# Patient Record
Sex: Male | Born: 1945
Health system: Southern US, Community
[De-identification: ages and names within clinical notes are randomized; demographics above are authoritative.]

## PROBLEM LIST (undated history)

## (undated) DIAGNOSIS — M419 Scoliosis, unspecified: Secondary | ICD-10-CM

## (undated) DIAGNOSIS — L03818 Cellulitis of other sites: Secondary | ICD-10-CM

## (undated) DIAGNOSIS — I10 Essential (primary) hypertension: Secondary | ICD-10-CM

## (undated) DIAGNOSIS — M1990 Unspecified osteoarthritis, unspecified site: Secondary | ICD-10-CM

## (undated) DIAGNOSIS — M109 Gout, unspecified: Secondary | ICD-10-CM

## (undated) DIAGNOSIS — R161 Splenomegaly, not elsewhere classified: Secondary | ICD-10-CM

## (undated) DIAGNOSIS — G709 Myoneural disorder, unspecified: Secondary | ICD-10-CM

## (undated) DIAGNOSIS — K635 Polyp of colon: Secondary | ICD-10-CM

## (undated) DIAGNOSIS — L02818 Cutaneous abscess of other sites: Secondary | ICD-10-CM

## (undated) HISTORY — DX: Cellulitis of other sites: L03.818

## (undated) HISTORY — PX: OTHER SURGICAL HISTORY: SHX169

## (undated) HISTORY — DX: Essential (primary) hypertension: I10

## (undated) HISTORY — PX: BACK SURGERY: SHX140

## (undated) HISTORY — DX: Cellulitis of other sites: L02.818

---

## 2003-02-19 ENCOUNTER — Inpatient Hospital Stay (HOSPITAL_COMMUNITY): Admission: RE | Admit: 2003-02-19 | Discharge: 2003-02-20 | Payer: Self-pay | Admitting: Neurosurgery

## 2003-02-19 ENCOUNTER — Encounter: Payer: Self-pay | Admitting: Neurosurgery

## 2007-11-06 ENCOUNTER — Encounter: Payer: Self-pay | Admitting: Infectious Diseases

## 2007-11-13 DIAGNOSIS — I10 Essential (primary) hypertension: Secondary | ICD-10-CM

## 2007-11-19 ENCOUNTER — Ambulatory Visit: Payer: Self-pay | Admitting: Infectious Diseases

## 2007-11-19 DIAGNOSIS — L02818 Cutaneous abscess of other sites: Secondary | ICD-10-CM

## 2007-11-19 DIAGNOSIS — L03818 Cellulitis of other sites: Secondary | ICD-10-CM

## 2008-05-21 ENCOUNTER — Ambulatory Visit: Payer: Self-pay | Admitting: Cardiology

## 2008-06-10 ENCOUNTER — Ambulatory Visit: Payer: Self-pay | Admitting: Cardiology

## 2008-08-25 ENCOUNTER — Emergency Department (HOSPITAL_COMMUNITY): Admission: EM | Admit: 2008-08-25 | Discharge: 2008-08-25 | Payer: Self-pay | Admitting: Emergency Medicine

## 2008-09-07 ENCOUNTER — Telehealth: Payer: Self-pay | Admitting: Infectious Diseases

## 2009-02-18 ENCOUNTER — Encounter: Payer: Self-pay | Admitting: Cardiology

## 2009-02-25 ENCOUNTER — Ambulatory Visit: Payer: Self-pay | Admitting: Cardiology

## 2010-03-07 ENCOUNTER — Ambulatory Visit: Payer: Self-pay | Admitting: Cardiology

## 2011-01-17 NOTE — Assessment & Plan Note (Signed)
Summary: 1 YR FU PER MARCH REMINDER-SRS   Visit Type:  Follow-up Primary Provider:  Dr. Doreen Beam  CC:  follow-up visit.  History of Present Illness: the patient is a 65 year old male with a history of hypertension. Patient also has history of recurrent Staphylococcus infections. He has known hypertensive heart disease. Stress test has been negative for ischemia an echocardiogram demonstrates normal LV function. The patient states is doing well. He denies any chest pain shortness of breath. He has no orthopnea PND. He is followed by primary care physician for routine blood work and had a lipid panel drawn. Reportedly this was within normal limits.  The patient denies any palpitations presyncope or syncope.  Preventive Screening-Counseling & Management  Alcohol-Tobacco     Smoking Status: quit  Comments: quit 17 yrs ago, started as a child  Current Medications (verified): 1)  Bactrim Ds 800-160 Mg  Tabs (Sulfamethoxazole-Trimethoprim) .... Two Tablets Two Times A Day For 10 Days 2)  Lisinopril-Hydrochlorothiazide 10-12.5 Mg Tabs (Lisinopril-Hydrochlorothiazide) .... Take 1 Tablet By Mouth Once A Day 3)  Metoprolol Succinate 50 Mg Xr24h-Tab (Metoprolol Succinate) .... Take 1 Tablet By Mouth Once  A Day 4)  Simvastatin 40 Mg Tabs (Simvastatin) .... Take 1 Tablet By Mouth Every Night 5)  Aspirin 81 Mg Tbec (Aspirin) .... Take One Tablet By Mouth Daily  Allergies (verified): No Known Drug Allergies  Comments:  Nurse/Medical Assistant: The patient's medications were reviewed with the patient and were updated in the Medication List. Pt brought a list of medications to office visit.  Cyril Loosen, RN, BSN (March 07, 2010 9:45 AM)  Past History:  Past Medical History: Last updated: 07/08/2009 CELLULITIS AND ABSCESS OF OTHER SPECIFIED SITE [phone]) HYPERTENSION (ICD-401.9) DIABETES MELLITUS, TYPE II (ICD-250.00)    Family History: Last updated: 07/08/2009 Family History of  Cancer:  Family History of Coronary Artery Disease:  Family History of Diabetes:  kidney failure  Social History: Last updated: 07/08/2009 Fishes alot - both salt water and fresh Works in [employer].   Quit tobacco 14 yrs ago Alcohol Use - yes -- 6-8 at a time on the weekends  Risk Factors: Smoking Status: quit (03/07/2010)  Review of Systems  The patient denies anorexia, fever, weight loss, weight gain, vision loss, decreased hearing, hoarseness, chest pain, syncope, dyspnea on exertion, peripheral edema, prolonged cough, headaches, hemoptysis, abdominal pain, melena, hematochezia, severe indigestion/heartburn, hematuria, incontinence, genital sores, muscle weakness, suspicious skin lesions, transient blindness, difficulty walking, depression, unusual weight change, abnormal bleeding, enlarged lymph nodes, angioedema, breast masses, and testicular masses.    Vital Signs:  Patient profile:   65 year old male Height:      72 inches Weight:      192.50 pounds BMI:     26.20 Pulse rate:   62 / minute BP sitting:   94 / 63  (left arm) Cuff size:   regular  Vitals Entered By: Cyril Loosen, RN, BSN (March 07, 2010 9:40 AM)  Nutrition Counseling: Patient's BMI is greater than 25 and therefore counseled on weight management options. CC: follow-up visit Comments Pt c/o of some burning in his chest once in a while   Physical Exam  Additional Exam:  General: Well-developed, well-nourished in no distress head: Normocephalic and atraumatic eyes PERRLA/EOMI intact, conjunctiva and lids normal nose: No deformity or lesions mouth normal dentition, normal posterior pharynx neck: Supple, no JVD.  No masses, thyromegaly or abnormal cervical nodes lungs: Normal breath sounds bilaterally without wheezing.  Normal percussion heart: regular  rate and rhythm with normal S1 and S2, no S3 or S4.  PMI is normal.  No pathological murmurs abdomen: Normal bowel sounds, abdomen is soft and  nontender without masses, organomegaly or hernias noted.  No hepatosplenomegaly musculoskeletal: Back normal, normal gait muscle strength and tone normal pulsus: Pulse is normal in all 4 extremities Extremities: No peripheral pitting edema neurologic: Alert and oriented x 3 skin: Intact without lesions or rashes cervical nodes: No significant adenopathy psychologic: Normal affect    EKG  Procedure date:  03/07/2010  Findings:      normal sinus rhythm. Early repolarization probably normal variant. Heart rate 65 beats per minute  Impression & Recommendations:  Problem # 1:  HYPERTENSION (ICD-401.9) Blood pressure under very good control. The following medications were removed from the medication list:    Lopressor 50 Mg Tabs (Metoprolol tartrate) ..... One daily His updated medication list for this problem includes:    Lisinopril-hydrochlorothiazide 10-12.5 Mg Tabs (Lisinopril-hydrochlorothiazide) .Marland Kitchen... Take 1 tablet by mouth once a day    Metoprolol Succinate 50 Mg Xr24h-tab (Metoprolol succinate) .Marland Kitchen... Take 1 tablet by mouth once  a day    Aspirin 81 Mg Tbec (Aspirin) .Marland Kitchen... Take one tablet by mouth daily  Orders: EKG w/ Interpretation (93000)  Problem # 2:  CELLULITIS AND ABSCESS OF OTHER SPECIFIED SITE [phone]) Assessment: Comment Only  Patient Instructions: 1)  Your physician recommends that you continue on your current medications as directed. Please refer to the Current Medication list given to you today. 2)  Follow up in  1 year Prescriptions: SIMVASTATIN 40 MG TABS (SIMVASTATIN) Take 1 tablet by mouth every night  #30 x 11   Entered by:   Hoover Brunette, LPN   Authorized by:   Lewayne Bunting, MD, [facility]   Signed by:   Hoover Brunette, LPN on 06/16/1600   Method used:   Electronically to        [facility] [phone]* (retail)       [address]       [place], [place]  [address]       Ph: [phone] or [phone]       Fax: [phone]   RxID:    [id number] METOPROLOL SUCCINATE 50 MG XR24H-TAB (METOPROLOL SUCCINATE) Take 1 tablet by mouth once  a day  #30 x 11   Entered by:   Hoover Brunette, LPN   Authorized by:   Lewayne Bunting, MD, [facility]   Signed by:   Hoover Brunette, LPN on 93/81/8299   Method used:   [facility] to        [facility] [phone]* (retail)       [address]       [place], [place]  [address]       Ph: [phone] or [phone]       Fax: [phone]   RxID:   [id number] LISINOPRIL-HYDROCHLOROTHIAZIDE 10-12.5 MG TABS (LISINOPRIL-HYDROCHLOROTHIAZIDE) Take 1 tablet by mouth once a day  #30 Tablet x 11   Entered by:   Hoover Brunette, LPN   Authorized by:   Lewayne Bunting, MD, [facility]   Signed by:   Hoover Brunette, LPN on 12/45/8099   Method used:   [facility]ically to        [facility] [phone]* (retail)       [address]  [place], [place]  [address]       Ph: [phone] or [phone]       Fax: [phone]   RxID:   [id number]

## 2011-04-03 ENCOUNTER — Other Ambulatory Visit: Payer: Self-pay | Admitting: *Deleted

## 2011-04-03 MED ORDER — SIMVASTATIN 40 MG PO TABS: 40.0000 mg | ORAL_TABLET | Freq: Every day | ORAL | Status: DC

## 2011-04-06 ENCOUNTER — Encounter: Payer: Self-pay | Admitting: Cardiology

## 2011-04-07 ENCOUNTER — Ambulatory Visit (INDEPENDENT_AMBULATORY_CARE_PROVIDER_SITE_OTHER): Payer: BC Managed Care – PPO | Admitting: Cardiology

## 2011-04-07 ENCOUNTER — Encounter: Payer: Self-pay | Admitting: Cardiology

## 2011-04-07 VITALS — BP 119/81 | HR 53 | Ht 71.0 in | Wt 191.0 lb

## 2011-04-07 DIAGNOSIS — I10 Essential (primary) hypertension: Secondary | ICD-10-CM

## 2011-04-07 DIAGNOSIS — E785 Hyperlipidemia, unspecified: Secondary | ICD-10-CM

## 2011-04-07 MED ORDER — METOPROLOL SUCCINATE ER 50 MG PO TB24: 50.0000 mg | ORAL_TABLET | Freq: Every day | ORAL | Status: DC

## 2011-04-07 MED ORDER — SIMVASTATIN 40 MG PO TABS: 40.0000 mg | ORAL_TABLET | Freq: Every day | ORAL | Status: DC

## 2011-04-07 MED ORDER — LISINOPRIL-HYDROCHLOROTHIAZIDE 10-12.5 MG PO TABS: 1.0000 | ORAL_TABLET | Freq: Every day | ORAL | Status: DC

## 2011-04-07 NOTE — Patient Instructions (Signed)
Your physician you to follow up in 1 year. You will receive a reminder letter in the mail one-two months in advance. If you don't receive a letter, please call our office to schedule the follow-up appointment. Your physician recommends that you continue on your current medications as directed. Please refer to the Current Medication list given to you today. 

## 2011-04-29 DIAGNOSIS — E785 Hyperlipidemia, unspecified: Secondary | ICD-10-CM | POA: Insufficient documentation

## 2011-04-29 NOTE — Assessment & Plan Note (Signed)
On statin drug therapy and followed by his primary care physician. 

## 2011-04-29 NOTE — Assessment & Plan Note (Signed)
Blood pressure one control.  No further adjustments in medications are required.

## 2011-04-29 NOTE — Progress Notes (Signed)
HPI The patient is a 65 year old male with history of hypertension. He has a history of recurrent Staphylococcus infections. He has a history of hypertensive heart disease Previous stress tests have been negative for ischemia an echocardiogram has demonstrated normal LV function. Last office visit was a year ago and he was doing well. The patient is doing well. He reports no chest pain shortness of breath orthopnea PND. Has no palpitations or syncope. His blood pressures under control.  No Known Allergies  Current Outpatient Prescriptions on File Prior to Visit  Medication Sig Dispense Refill  . aspirin 81 MG tablet Take 81 mg by mouth daily.          Past Medical History  Diagnosis Date  . Hypertension   . Diabetes mellitus   . Cellulitis and abscess of other specified site     No past surgical history on file.  Family History  Problem Relation Age of Onset  . Cancer Other     Family Hx of Cancer, CAD,Diabetes,Kidney Failure    History   Social History  . Marital Status: Married    Spouse Name: N/A    Number of Children: N/A  . Years of Education: N/A   Occupational History  . 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 Smoking status: Former Smoker -- 3.0 packs/day    Types: Cigarettes    Quit date: 12/18/1996  . Smokeless tobacco: Not on file   Comment: started smoking as a child  . Alcohol Use: Yes  . Drug Use: No  . Sexually Active: Not on file   Other Topics Concern  . Not on file   Social History Narrative  . No narrative on file   Review of systems:Pertinent positives as outlined above. The remainder of the 18  point review of systems is negative  PHYSICAL EXAM BP 119/81  Pulse 53  Ht 5\' 11"  (1.803 m)  Wt 191 lb (86.637 kg)  BMI 26.64 kg/m2 General: Well-developed, well-nourished in no distress Head: Normocephalic and atraumatic Eyes:PERRLA/EOMI intact, conjunctiva and lids normal Ears: No deformity or lesions Mouth:normal dentition, normal  posterior pharynx Neck: Supple, no JVD.  No masses, thyromegaly or abnormal cervical nodes Lungs: Normal breath sounds bilaterally without wheezing.  Normal percussion Cardiac: regular rate and rhythm with normal S1 and S2, no S3 or S4.  PMI is normal.  No pathological murmurs Abdomen: Normal bowel sounds, abdomen is soft and nontender without masses, organomegaly or hernias noted.  No hepatosplenomegaly MSK: Back normal, normal gait muscle strength and tone normal Vascular: Pulse is normal in all 4 extremities Extremities: No peripheral pitting edema Neurologic: Alert and oriented x 3 Skin: Intact without lesions or rashes Lymphatics: No significant adenopathy Psychologic: Normal affect   ECG:not available  ASSESSMENT AND PLAN

## 2011-05-02 NOTE — Assessment & Plan Note (Signed)
[facility]                          [facility] OFFICE NOTE   NAME:[name], [name]                       MRN:          [id number]  DATE:05/21/2008                            DOB:          04-11-1946    REFERRING PHYSICIAN:  Doreen Beam, MD   REASON FOR CONSULTATION:  Evaluation of shortness of breath.   HISTORY OF PRESENT ILLNESS:  The patient is a very pleasant 65 year old  white male with no prior cardiac history, but with a history of  hypertension.  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  He has had a prior Cardiolite stress study done in 2004 ordered  by Dr. Sherril Croon, which was  essentially within normal limits, although his  ejection fraction was low normal at 48%.  He now reports approximately 1-  year history of dyspnea on exertion.  He also thinks that his blood  pressure has been elevated for the last 3 years.  He denies any chest  pain, although during activity and sometimes at rest, he has a sharp  knife-like sensation in the left hemichest that only lasts a few  minutes.  There is no associated nausea or diaphoresis.  He also reports  that after meals when he walks up a hill, he has some increasing  tightness, but particularly dyspnea.  He has no orthopnea and PND.  He  reports no palpitations or syncope.   The patient's cardiac risk factors include a history of tobacco use and  also exposure to secondhand smoke from his wife.  He has a family  history with a brother who has coronary artery bypass grafting.  He is  currently also under a lot of emotional and work-related stress.   ALLERGIES:  No known drug allergies.   MEDICATION:  Metoprolol tartrate 50 mg p.o. daily.   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  He did smoke up until 16 years ago, at which time he smoked 3  packs a day.  He is also exposed to secondhand smoke at his own  residence.  The patient states that  occasionally he will binge drinks  socially on the weekends.  At times, he will consume 6-8 beers at a  time.   FAMILY HISTORY:  Notable for mother and father who died from kidney  failure and colon cancer respectively.  He has 2 brothers and 1 sister;  one brother has had coronary bypass grafting and has diabetes mellitus.   REVIEW OF SYSTEMS:  As per HPI and otherwise negative.   PHYSICAL EXAMINATION:  VITAL SIGNS:  Blood pressure 179/101 in the right  arm, 176/91 in the left arm, heart rate is 53 beats per minute, and  weight is 195 pounds.  GENERAL:  Well-nourished white male, tanned, and in no distress.  HEENT:  Pupils are equal.  Conjunctivae are clear.  NECK:  Supple.  Normal carotid upstroke.  No carotid bruits.  LUNGS:  Clear breath sounds bilaterally.  HEART:  Regular rate and rhythm with normal S1 and S2.  No murmur, rubs,  or gallops.  ABDOMEN:  Soft.  Nontender.  No rebound or guarding.  Good bowel sounds.  EXTREMITIES:  No cyanosis, clubbing, or edema.  NEURO:  The patient is alert, oriented, and grossly nonfocal.  The  patient has good dorsalis pedis and posterior tibial pulses and has no  vascular bruits.   A 12-lead electrocardiogram, normal sinus rhythm with left ventricular  hypertrophy and early repolarization, which is likely a normal variant.   PROBLEM LIST:  1. Dyspnea.  2. Hypertensive heart disease.  3. Rule out coronary artery disease.  4. Multiple risk factors for coronary artery disease including      hypertension, family history, and prior tobacco use.  5. History of methicillin-resistant Staphylococcus aureus infection.  6. History of back surgery.   PLAN:  1. I suspect the patient has significant hypertensive heart disease.      He has markedly elevated blood pressures including his diastolic.      He has symptoms of chronic diastolic heart failure.  I doubt he has      significant coronary artery disease, but he will need to be      evaluated  for this.  2. I had a long discussion with the patient regarding risk factor      modification and his symptoms.  We will start him on aspirin and a      combination of hydrochlorothiazide and lisinopril 12.5 and 10 mg      respectively.  He can continue beta-blocker therapy.  The latter      will need to be held prior to his test.  3. The patient also will have a lipid panel drawn.  I assume that he      will need to start a statin drug therapy.  I also discussed with      him his occasional binge drinking and to discontinue this as this      worsens his hypertension.  We will follow up with the patient, and      his daughter-in-law will keep a diary and a log of his blood      pressure readings, and we will make further adjustments in his      antihypertensive medications.     Learta Codding, MD,FACC  Electronically Signed    GED/MedQ  DD: 05/21/2008  DT: 05/21/2008  Job #: [id number]   cc:   Doreen Beam, MD

## 2011-05-02 NOTE — Assessment & Plan Note (Signed)
[facility]                          [facility]ICE NOTE   NAME:[name]                       MRN:          [id number]  DATE:02/25/2009                            DOB:          12-Jan-1946    HISTORY OF PRESENT ILLNESS:  The patient is a 65 year old male with no  prior cardiac history, but with significant hypertension.  He was  evaluated for this on May 21, 2008.  His systolic blood pressure was  179.  His diastolic blood pressure was 101.  He reports a 1-year history  of dyspnea on exertion.  I felt that, at that time the patient had  hypertensive heart disease.  A prior stress test showed no ischemia with  ejection fraction of 48%.  A followup echo; however, after the patient  was started on medical treatment for hypertension showed now normal  ejection fraction with no wall motion abnormality.  Since his medical  therapy has been initiated, the patient has been doing extremely well.  He denies any chest pain, shortness of breath, orthopnea, or PND.  He  states that he has to hold on his feet, works actively.  He has  absolutely no limitations.  He is an NYHA class I.  The patient does  have a family history of coronary artery disease.  He seems to be under  less emotional and work-related stress than previously.   MEDICATIONS:  1. Metoprolol tartrate 50 mg p.o. daily.  2. Aspirin 81 p.o. daily.  3. Lisinopril and hydrochlorothiazide 12.5/10.  4. Simvastatin 40 mg p.o. at bedtime.   PHYSICAL EXAMINATION:  VITAL SIGNS:  Blood pressure is 105/63, heart  rate 58, and weight 194.2.  GENERAL:  Well nourished white male, in no apparent distress.  HEENT:  Pupils eyes clear; conjunctivae clear.  NECK:  Supple.  Normal carotid stroke.  No carotid bruits.  LUNGS:  Clear breath sounds bilaterally.  HEART:  Regular rate and rhythm.  Normal S1 and S2.  No murmurs, rubs,  or gallop.  ABDOMEN:  Soft and nontender.  No rebound or guarding.   Good bowel  sounds.  EXTREMITIES:  No cyanosis, clubbing, or edema.  NEUROLOGIC:  The patient is alert and oriented.  Grossly nonfocal.   PROBLEMS:  1. Dyspnea, resolved.  2. Hypertensive heart disease, ejection fraction improved with normal      blood pressure.  3. Absence of substernal chest pain.  Negative Cardiolite several      years ago.  4. History of methicillin-resistant Staphylococcus aureus infection.  5. History of back surgery.   PLAN:  1. At the present time, with the patient being entirely asymptomatic,      it is hard to justify a repeat stress test.  He certainly does not      warrant a catheterization.  2. His symptoms have markedly improved after we controlled his blood      pressure.  His blood pressure now 105/63, which is a remarkable      achievement.  3. I also reviewed the patient's lipid panel.  His liver function  tests are within normal limits and his LDL is at goal at 87 with a      total cholesterol of 145, HDL is 32.  For the latter I have      encouraged him to increase his exercise pattern.     Learta Codding, MD,FACC  Electronically Signed    GED/MedQ  DD: 02/25/2009  DT: 02/25/2009  Job #: [id number]

## 2011-05-04 ENCOUNTER — Other Ambulatory Visit: Payer: Self-pay | Admitting: *Deleted

## 2011-05-04 DIAGNOSIS — Z79899 Other long term (current) drug therapy: Secondary | ICD-10-CM

## 2011-05-04 DIAGNOSIS — E782 Mixed hyperlipidemia: Secondary | ICD-10-CM

## 2011-05-05 NOTE — Op Note (Signed)
[name], [name]                          ACCOUNT NO.:  [id number]   MEDICAL RECORD NO.:  [id number]                   PATIENT TYPE:  INP   LOCATION:  3038                                 FACILITY:  [facility]   PHYSICIAN:  Hewitt Shorts, M.D.            DATE OF BIRTH:  10/13/1946   DATE OF PROCEDURE:  02/19/2003  DATE OF DISCHARGE:                                 OPERATIVE REPORT   PREOPERATIVE DIAGNOSIS:  Lumbar stenosis.   POSTOPERATIVE DIAGNOSIS:  Lumbar stenosis.   OPERATION PERFORMED:  L3 through 5 decompressive lumbar laminectomy with  microdissection.   SURGEON:  Hewitt Shorts, M.D.   ASSISTANT:  Clydene Fake, M.D.   ANESTHESIA:  General endotracheal.   INDICATIONS FOR PROCEDURE:  The patient is a 65 year old man who presented  with low back and bilateral lower extremity pain consistent with  claudication.  He was found by MRI scan to have a disk herniation at the T12-  L1 level as well as multifactorial, multilevel lumbar stenosis, L3-4 and L4-  5.  It was felt that the stenosis was the most likely the cause of his  claudication and therefore recommendation was made for decompressive lumbar  laminectomy and the patient is admitted for such.   DESCRIPTION OF PROCEDURE:  The patient was brought to the operating room and  placed under general endotracheal anesthesia.  The patient was turned to a  prone position.  The lumbar region was prepped with Betadine soap and  solution and draped in a sterile fashion.  The midline was infiltrated with  local anesthetic with epinephrine and a midline incision was made after an x-  ray was taken to localize the L3-4 and L4-5 levels.  Dissection was carried  down through the subcutaneous tissue to the lumbar fascia which was incised  bilaterally and the paraspinal muscles were dissected from the spinous  process and lamina in a subperiosteal fashion.  An x-ray was taken and the  L3-4 and L4-5 interlaminar space was  identified.  We then began laminectomy  using double action rongeurs, the [facility] Max drill.  The operating microscope  was draped and brought into the field to provide additional magnification,  illumination and visualization and the remainder of the laminectomy was  performed again using the [facility] Max drill, double action rongeurs and  Kerrison punches.  The ligamentum flavum was markedly thickened at each  interlaminar level and was carefully removed thereby decompressing the  thecal sac.  The epidural space was carefully examined, nerve roots exiting  laterally were decompressed as well, removing thickened ligament tissue from  overlying them.  In the end we were able to decompress the L3-4 and L4-5  levels.  The wound was irrigated extensively with both saline solution and  subsequently with Kanamycin solution.  We then placed Gelfoam soaked in  thrombin in the laminectomy defect.  Once hemostasis was established, we  proceeded  with closure.  The paraspinous muscles were approximated with  interrupted undyed 1 Vicryl sutures, the deep fascia with interrupted undyed  1 Vicryl sutures and the subcutaneous and subcuticular layer closed with  inverted interrupted 2-0 undyed Vicryl sutures.  Skin edges were  approximated with Dermabond.  The patient tolerated the procedure well.  The  estimated blood loss was 125 ml.  Sponge and instrument counts were correct.  Following surgery, the patient was turned back to supine position to be  reversed from anesthetic, extubated and transferred to the recovery room for  further care.                                                Hewitt Shorts, M.D.    RWN/MEDQ  D:  02/19/2003  T:  02/19/2003  Job:  [id number]

## 2012-04-08 ENCOUNTER — Telehealth: Payer: Self-pay | Admitting: *Deleted

## 2012-04-08 DIAGNOSIS — E785 Hyperlipidemia, unspecified: Secondary | ICD-10-CM

## 2012-04-08 DIAGNOSIS — Z79899 Other long term (current) drug therapy: Secondary | ICD-10-CM

## 2012-04-08 MED ORDER — LISINOPRIL-HYDROCHLOROTHIAZIDE 10-12.5 MG PO TABS: 1.0000 | ORAL_TABLET | Freq: Every day | ORAL | Status: DC

## 2012-04-08 MED ORDER — SIMVASTATIN 40 MG PO TABS: 40.0000 mg | ORAL_TABLET | Freq: Every day | ORAL | Status: DC

## 2012-04-08 MED ORDER — METOPROLOL SUCCINATE ER 50 MG PO TB24: 50.0000 mg | ORAL_TABLET | Freq: Every day | ORAL | Status: DC

## 2012-04-08 NOTE — Telephone Encounter (Signed)
Pt's wife called regarding prescription refills. Pt is also due for FLP/LFT. He will have this done before his June OV. Pt's wife notified pt should be fasting for these labs. Refills sent to pt's pharmacy.

## 2012-04-17 ENCOUNTER — Encounter: Payer: Self-pay | Admitting: *Deleted

## 2012-05-20 ENCOUNTER — Encounter: Payer: Self-pay | Admitting: Cardiology

## 2012-05-20 ENCOUNTER — Ambulatory Visit (INDEPENDENT_AMBULATORY_CARE_PROVIDER_SITE_OTHER): Payer: BC Managed Care – PPO | Admitting: Cardiology

## 2012-05-20 VITALS — BP 113/61 | HR 53 | Ht 71.0 in | Wt 191.0 lb

## 2012-05-20 DIAGNOSIS — Z0389 Encounter for observation for other suspected diseases and conditions ruled out: Secondary | ICD-10-CM

## 2012-05-20 DIAGNOSIS — A4902 Methicillin resistant Staphylococcus aureus infection, unspecified site: Secondary | ICD-10-CM

## 2012-05-20 DIAGNOSIS — E785 Hyperlipidemia, unspecified: Secondary | ICD-10-CM

## 2012-05-20 DIAGNOSIS — I10 Essential (primary) hypertension: Secondary | ICD-10-CM

## 2012-05-20 NOTE — Progress Notes (Signed)
Peyton Bottoms, MD, [facility] ABIM Board Certified in Adult Cardiovascular Medicine,Internal Medicine and Critical Care Medicine    CC: Followup patient with hypertension dyslipidemia  HPI:  Patient had a prior ischemia workup couple of years ago. He has been doing well. 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 He does take frequent rests. At times he feels tired but he has no chest pain or shortness of breath. He reports no palpitations or syncope. From a cardiovascular standpoint is stable. His blood pressure is well controlled.    PMH: reviewed and listed in Problem List in Electronic Records (and see below) Past Medical History  Diagnosis Date  . Hypertension   . Diabetes mellitus   . Cellulitis and abscess of other specified site    No past surgical history on file.  Allergies/SH/FHX : available in Electronic Records for review  No Known Allergies History   Social History  . Marital Status: Married    Spouse Name: N/A    Number of Children: N/A  . Years of Education: N/A   Occupational History  . 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 Smoking status: Former Smoker -- 3.0 packs/day for 30 years    Types: Cigarettes    Quit date: 12/18/1996  . Smokeless tobacco: Never Used   Comment: started smoking as a child  . Alcohol Use: Yes  . Drug Use: No  . Sexually Active: Not on file   Other Topics Concern  . Not on file   Social History Narrative  . No narrative on file   Family History  Problem Relation Age of Onset  . Cancer Other     Family Hx of Cancer, CAD,Diabetes,Kidney Failure    Medications: Current Outpatient Prescriptions  Medication Sig Dispense Refill  . aspirin 81 MG tablet Take 81 mg by mouth daily.        Marland Kitchen lisinopril-hydrochlorothiazide (PRINZIDE,ZESTORETIC) 10-12.5 MG per tablet Take 1 tablet by mouth daily.  90 tablet  3  . metoprolol succinate (TOPROL-XL) 50 MG 24 hr tablet Take 1 tablet (50 mg total) by mouth daily.  90  tablet  3  . simvastatin (ZOCOR) 40 MG tablet Take 1 tablet (40 mg total) by mouth at bedtime.  90 tablet  3    ROS: No nausea or vomiting. No fever or chills.No melena or hematochezia.No bleeding.No claudication  Physical Exam: BP 113/61  Pulse 53  Ht 5\' 11"  (1.803 m)  Wt 191 lb (86.637 kg)  BMI 26.64 kg/m2 General: Well-nourished white male in no distress. Neck: No carotid upstroke no carotid bruits. No thyromegaly nonnodular thyroid. JVP is 5 cm Lungs: Clear breath sounds bilaterally without any wheezing Cardiac: Regular rate and rhythm with normal S1-S2 no murmur rubs or gallops Vascular: No edema. Normal distal pulses Skin: Warm and dry Physcologic: Normal affect  12lead ECG: Normal sinus rhythm with early repolarization normal variant Limited bedside ECHO:N/A No images are attached to the encounter.   Assessment and Plan  Coronary artery disease excluded The patient denies any chest pain or shortness of breath. He will continue on current medical treatment. No further ischemia testing is necessary at this point in time he does need it here to risk factor modification given his history of diabetes mellitus and hypertension.  HYPERTENSION Blood pressure well controlled on current medical regimen.  Hyperlipidemia Followed by his primary care physician.    Patient Active Problem List  Diagnoses  . DIABETES MELLITUS, TYPE II  .  HYPERTENSION  . CELLULITIS AND ABSCESS OF OTHER SPECIFIED SITE  . Hyperlipidemia  . Coronary artery disease excluded  . MRSA (methicillin resistant Staphylococcus aureus)

## 2012-05-20 NOTE — Assessment & Plan Note (Signed)
The patient denies any chest pain or shortness of breath. He will continue on current medical treatment. No further ischemia testing is necessary at this point in time he does need it here to risk factor modification given his history of diabetes mellitus and hypertension.

## 2012-05-20 NOTE — Patient Instructions (Signed)
Continue all current medications. Your physician wants you to follow up in:  1 year.  You will receive a reminder letter in the mail one-two months in advance.  If you don't receive a letter, please call our office to schedule the follow up appointment   

## 2012-05-20 NOTE — Assessment & Plan Note (Signed)
Blood pressure well-controlled on current medical regimen 

## 2012-05-20 NOTE — Assessment & Plan Note (Signed)
Followed by his primary care physician. 

## 2012-12-23 ENCOUNTER — Other Ambulatory Visit (INDEPENDENT_AMBULATORY_CARE_PROVIDER_SITE_OTHER): Payer: Self-pay | Admitting: *Deleted

## 2012-12-23 ENCOUNTER — Telehealth (INDEPENDENT_AMBULATORY_CARE_PROVIDER_SITE_OTHER): Payer: Self-pay | Admitting: *Deleted

## 2012-12-23 DIAGNOSIS — Z1211 Encounter for screening for malignant neoplasm of colon: Secondary | ICD-10-CM

## 2012-12-23 MED ORDER — PEG-KCL-NACL-NASULF-NA ASC-C 100 G PO SOLR: 1.0000 | Freq: Once | ORAL | Status: DC

## 2012-12-23 NOTE — Telephone Encounter (Signed)
  Procedure: tcs  Reason/Indication:  screening  Has patient had this procedure before?  12-13 yrs ago  If so, when, by whom and where?    Is there a family history of colon cancer?  no  Who?  What age when diagnosed?    Is patient diabetic?   no      Does patient have prosthetic heart valve?  no  Do you have a pacemaker?  no  Has patient had joint replacement within last 12 months?  no  Is patient on Coumadin, Plavix and/or Aspirin? yes  Medications: asa 81 mg daily, simvastatin 40 mg daily, metoprolol 50 mg daily, lisinopril 10/12.5 mg daily, nabumetone 500 mg bid  Allergies: nkda  Medication Adjustment: asa 2 days  Procedure date & time: 01/10/13 at 730

## 2012-12-23 NOTE — Telephone Encounter (Signed)
agree

## 2012-12-25 ENCOUNTER — Encounter (HOSPITAL_COMMUNITY): Payer: Self-pay | Admitting: Pharmacy Technician

## 2012-12-30 ENCOUNTER — Telehealth (INDEPENDENT_AMBULATORY_CARE_PROVIDER_SITE_OTHER): Payer: Self-pay | Admitting: *Deleted

## 2012-12-30 DIAGNOSIS — Z1211 Encounter for screening for malignant neoplasm of colon: Secondary | ICD-10-CM

## 2012-12-30 NOTE — Telephone Encounter (Signed)
Patient states pharmacy didn't have, please resend

## 2012-12-31 MED ORDER — PEG-KCL-NACL-NASULF-NA ASC-C 100 G PO SOLR: 1.0000 | Freq: Once | ORAL | Status: DC

## 2013-01-10 ENCOUNTER — Encounter (HOSPITAL_COMMUNITY): Payer: Self-pay | Admitting: *Deleted

## 2013-01-10 ENCOUNTER — Encounter (HOSPITAL_COMMUNITY)
Admission: RE | Disposition: A | Discharge status: Home / Self Care | Payer: Self-pay | Source: Ambulatory Visit | Attending: Internal Medicine

## 2013-01-10 ENCOUNTER — Ambulatory Visit (HOSPITAL_COMMUNITY)
Admission: RE | Admit: 2013-01-10 | Discharge: 2013-01-10 | Disposition: A | Discharge status: Home / Self Care | Payer: BC Managed Care – PPO | Source: Ambulatory Visit | Attending: Internal Medicine | Admitting: Internal Medicine

## 2013-01-10 DIAGNOSIS — K5730 Diverticulosis of large intestine without perforation or abscess without bleeding: Secondary | ICD-10-CM

## 2013-01-10 DIAGNOSIS — D126 Benign neoplasm of colon, unspecified: Secondary | ICD-10-CM | POA: Insufficient documentation

## 2013-01-10 DIAGNOSIS — I10 Essential (primary) hypertension: Secondary | ICD-10-CM | POA: Insufficient documentation

## 2013-01-10 DIAGNOSIS — E119 Type 2 diabetes mellitus without complications: Secondary | ICD-10-CM | POA: Insufficient documentation

## 2013-01-10 DIAGNOSIS — Z1211 Encounter for screening for malignant neoplasm of colon: Secondary | ICD-10-CM

## 2013-01-10 HISTORY — PX: COLONOSCOPY: SHX5424

## 2013-01-10 SURGERY — COLONOSCOPY
Anesthesia: Moderate Sedation

## 2013-01-10 MED ORDER — HYDROCODONE-ACETAMINOPHEN 5-300 MG PO TABS: 1.0000 | ORAL_TABLET | Freq: Four times a day (QID) | ORAL | Status: DC | PRN

## 2013-01-10 MED ORDER — MEPERIDINE HCL 50 MG/ML IJ SOLN
INTRAMUSCULAR | Status: DC | PRN
  Administered 2013-01-10 (×2): 25 mg via INTRAVENOUS

## 2013-01-10 MED ORDER — MEPERIDINE HCL 50 MG/ML IJ SOLN
INTRAMUSCULAR | Status: AC
  Filled 2013-01-10: qty 1

## 2013-01-10 MED ORDER — STERILE WATER FOR IRRIGATION IR SOLN
Status: DC | PRN
  Administered 2013-01-10: 07:00:00

## 2013-01-10 MED ORDER — MIDAZOLAM HCL 5 MG/5ML IJ SOLN
INTRAMUSCULAR | Status: AC
  Filled 2013-01-10: qty 10

## 2013-01-10 MED ORDER — SODIUM CHLORIDE 0.45 % IV SOLN
INTRAVENOUS | Status: DC
  Administered 2013-01-10: 07:00:00 via INTRAVENOUS

## 2013-01-10 MED ORDER — MIDAZOLAM HCL 5 MG/5ML IJ SOLN
INTRAMUSCULAR | Status: DC | PRN
  Administered 2013-01-10: 2 mg via INTRAVENOUS
  Administered 2013-01-10: 1 mg via INTRAVENOUS
  Administered 2013-01-10: 2 mg via INTRAVENOUS

## 2013-01-10 NOTE — Op Note (Signed)
COLONOSCOPY PROCEDURE REPORT  PATIENT:  [name]  MR#:  [id number] Birthdate:  August 07, 1946, 67 y.o., male Endoscopist:  Dr. Malissa Hippo, MD Referred By:  Dr. Ignatius Specking, MD Procedure Date: 01/10/2013  Procedure:   Colonoscopy with snare polypectomy.  Indications: Patient is 67 year old Caucasian male was undergoing average risk screening colonoscopy. His last exam was over 12 years ago.  Informed Consent:  The procedure and risks were reviewed with the patient and informed consent was obtained.  Medications:  Demerol 50 mg IV Versed 5 mg IV  Description of procedure:  After a digital rectal exam was performed, that colonoscope was advanced from the anus through the rectum and colon to the area of the cecum, ileocecal valve and appendiceal orifice. The cecum was deeply intubated. These structures were well-seen and photographed for the record. From the level of the cecum and ileocecal valve, the scope was slowly and cautiously withdrawn. The mucosal surfaces were carefully surveyed utilizing scope tip to flexion to facilitate fold flattening as needed. The scope was pulled down into the rectum where a thorough exam including retroflexion was performed.  Findings:   Prep satisfactory. Sessile geographic polyp at cecum with maximal length of 3 cm. Piecemeal polypectomy performed and residual polyp coagulated with snare tip. Five small  polyps coagulated using snare tip(2 at cecum and 3 distally). Four small polyps were snared and submitted together(3 at descending colon and one at proximal sigmoid). 20 mm polyp snared from distal sigmoid colon. Few small diverticula at sigmoid colon. Normal rectal mucosa and anal rectal junction.  Therapeutic/Diagnostic Maneuvers Performed:  See above  Complications:  None  Cecal Withdrawal Time:  37 minutes  Impression:  Examination performed to cecum. Large sessile cecal polyp snared piecemeal and edges coagulated with snare tip. 20 mm  polyp snared from distal sigmoid colon. Four small polyps were snared and submitted together(descending and sigmoid colon). Five small polyps were coagulated. Mild sigmoid colon diverticulosis.  Recommendations:  Standard instructions given. No aspirin and NSAIDs for 10 days. I will contact patient with biopsy results and further recommendations.  Ian Castagna U  01/10/2013 8:29 AM  CC: Dr. Ignatius Specking., MD & Dr. Bonnetta Barry ref. provider found

## 2013-01-10 NOTE — H&P (Signed)
[name] is an 67 y.o. male.   Chief Complaint: Patient is here for colonoscopy. HPI: Patient is 67 year old Caucasian male who is in for screening colonoscopy. His last exam was over 12  years ago. He denies abdominal pain change in his bowel habits or rectal bleeding. Family history is negative for colorectal carcinoma.  Past Medical History  Diagnosis Date  . Hypertension   . Cellulitis and abscess of other specified site   . Diabetes mellitus     Pt denies    Past Surgical History  Procedure Date  . Colonscopy   . Back surgery     Family History  Problem Relation Age of Onset  . Cancer Other     Family Hx of Cancer, CAD,Diabetes,Kidney Failure   Social History:  reports that he quit smoking about 16 years ago. His smoking use included Cigarettes. He has a 90 pack-year smoking history. He has never used smokeless tobacco. He reports that he drinks alcohol. He reports that he does not use illicit drugs.  Allergies: No Known Allergies  Medications Prior to Admission  Medication Sig Dispense Refill  . aspirin 81 MG tablet Take 81 mg by mouth daily.        Marland Kitchen lisinopril-hydrochlorothiazide (PRINZIDE,ZESTORETIC) 10-12.5 MG per tablet Take 1 tablet by mouth daily.  90 tablet  3  . metoprolol succinate (TOPROL-XL) 50 MG 24 hr tablet Take 1 tablet (50 mg total) by mouth daily.  90 tablet  3  . nabumetone (RELAFEN) 500 MG tablet Take 500 mg by mouth 2 (two) times daily.      . peg 3350 powder (MOVIPREP) 100 G SOLR Take 1 kit (100 g total) by mouth once.  1 kit  0  . simvastatin (ZOCOR) 40 MG tablet Take 1 tablet (40 mg total) by mouth at bedtime.  90 tablet  3    No results found for this or any previous visit (from the past 48 hour(s)). No results found.  ROS  Blood pressure 119/81, pulse 74, temperature 97.1 F (36.2 C), temperature source Oral, resp. rate 18, SpO2 95.00%. Physical Exam  Constitutional: He appears well-developed and well-nourished.  HENT:    Mouth/Throat: Oropharynx is clear and moist.  Eyes: Conjunctivae normal are normal. No scleral icterus.  Neck: No thyromegaly present.  Cardiovascular: Normal rate, regular rhythm and normal heart sounds.   No murmur heard. Respiratory: Effort normal and breath sounds normal.  GI: Soft. Bowel sounds are normal. He exhibits no distension and no mass. There is no tenderness.  Musculoskeletal: He exhibits no edema.  Lymphadenopathy:    He has no cervical adenopathy.  Neurological: He is alert.  Skin: Skin is warm and dry.     Assessment/Plan Average risk screening colonoscopy.  Shelvy Heckert U 01/10/2013, 7:34 AM

## 2013-01-14 ENCOUNTER — Encounter (HOSPITAL_COMMUNITY): Payer: Self-pay | Admitting: Internal Medicine

## 2013-01-15 ENCOUNTER — Encounter (INDEPENDENT_AMBULATORY_CARE_PROVIDER_SITE_OTHER): Payer: Self-pay | Admitting: *Deleted

## 2013-04-24 ENCOUNTER — Other Ambulatory Visit: Payer: Self-pay | Admitting: Cardiology

## 2013-05-15 ENCOUNTER — Other Ambulatory Visit: Payer: Self-pay | Admitting: Cardiology

## 2013-06-10 ENCOUNTER — Encounter (INDEPENDENT_AMBULATORY_CARE_PROVIDER_SITE_OTHER): Payer: Self-pay | Admitting: *Deleted

## 2013-06-25 ENCOUNTER — Encounter: Payer: Self-pay | Admitting: Cardiovascular Disease

## 2013-06-25 ENCOUNTER — Ambulatory Visit (INDEPENDENT_AMBULATORY_CARE_PROVIDER_SITE_OTHER): Payer: BC Managed Care – PPO | Admitting: Cardiovascular Disease

## 2013-06-25 VITALS — BP 119/77 | HR 57 | Ht 72.0 in | Wt 192.4 lb

## 2013-06-25 DIAGNOSIS — I10 Essential (primary) hypertension: Secondary | ICD-10-CM

## 2013-06-25 DIAGNOSIS — E785 Hyperlipidemia, unspecified: Secondary | ICD-10-CM

## 2013-06-25 DIAGNOSIS — Z0389 Encounter for observation for other suspected diseases and conditions ruled out: Secondary | ICD-10-CM

## 2013-06-25 MED ORDER — METOPROLOL SUCCINATE ER 25 MG PO TB24: 25.0000 mg | ORAL_TABLET | Freq: Every day | ORAL | Status: DC

## 2013-06-25 NOTE — Progress Notes (Signed)
Patient ID: [name], male   DOB: 1946/09/22, 67 y.o.   MRN: [id number]    SUBJECTIVE: [name] has a PMH significant for HTN and hypercholesterolemia. He quit smoking 22 years ago. He has been plumbing for 40 years. He feels very well and stays very active. He enjoys fishing. He denies chest pain, shortness of breath, and palpitations.   Filed Vitals:   06/25/13 0809  BP: 119/77  Pulse: 57  Height: 6' (1.829 m)  Weight: 192 lb 6.4 oz (87.272 kg)     PHYSICAL EXAM General: NAD Neck: No JVD, no thyromegaly or thyroid nodule.  Lungs: Clear to auscultation bilaterally with normal respiratory effort. CV: Nondisplaced PMI.  Heart regular S1/S2, no S3/S4, no murmur.  No peripheral edema.  No carotid bruit.  Normal pedal pulses.  Abdomen: Soft, nontender, no hepatosplenomegaly, no distention.  Neurologic: Alert and oriented x 3.  Psych: Normal affect. Extremities: No clubbing or cyanosis.   ECG: Sinus bradycardia, 57 bpm, ST segment changes consistent with early repolarization vs normal variant  LABS: Basic Metabolic Panel: No results found for this basename: NA, K, CL, CO2, GLUCOSE, BUN, CREATININE, CALCIUM, MG, PHOS,  in the last 72 hours Liver Function Tests: No results found for this basename: AST, ALT, ALKPHOS, BILITOT, PROT, ALBUMIN,  in the last 72 hours No results found for this basename: LIPASE, AMYLASE,  in the last 72 hours CBC: No results found for this basename: WBC, NEUTROABS, HGB, HCT, MCV, PLT,  in the last 72 hours Cardiac Enzymes: No results found for this basename: CKTOTAL, CKMB, CKMBINDEX, TROPONINI,  in the last 72 hours BNP: No components found with this basename: POCBNP,  D-Dimer: No results found for this basename: DDIMER,  in the last 72 hours Hemoglobin A1C: No results found for this basename: HGBA1C,  in the last 72 hours Fasting Lipid Panel: No results found for this basename: CHOL, HDL, LDLCALC, TRIG, CHOLHDL, LDLDIRECT,  in the last 72  hours Thyroid Function Tests: No results found for this basename: TSH, T4TOTAL, FREET3, T3FREE, THYROIDAB,  in the last 72 hours Anemia Panel: No results found for this basename: VITAMINB12, FOLATE, FERRITIN, TIBC, IRON, RETICCTPCT,  in the last 72 hours     ASSESSMENT AND PLAN: 1. HTN: well controlled. Will reduce Metoprolol to 25 mg daily. 2. Hypercholesterolemia: continue Simvastatin. Lipids followed by PCP.  Prentice Docker, M.D., F.A.C.C.

## 2013-06-25 NOTE — Patient Instructions (Signed)
   Decrease Toprol XL to 25mg  daily - new sent to pharm  Continue all other current medications. Your physician wants you to follow up in:  1 year.  You will receive a reminder letter in the mail one-two months in advance.  If you don't receive a letter, please call our office to schedule the follow up appointment

## 2013-07-17 ENCOUNTER — Telehealth (INDEPENDENT_AMBULATORY_CARE_PROVIDER_SITE_OTHER): Payer: Self-pay | Admitting: *Deleted

## 2013-07-17 ENCOUNTER — Other Ambulatory Visit (INDEPENDENT_AMBULATORY_CARE_PROVIDER_SITE_OTHER): Payer: Self-pay | Admitting: *Deleted

## 2013-07-17 DIAGNOSIS — Z86010 Personal history of colonic polyps: Secondary | ICD-10-CM

## 2013-07-17 DIAGNOSIS — Z1211 Encounter for screening for malignant neoplasm of colon: Secondary | ICD-10-CM

## 2013-07-17 NOTE — Telephone Encounter (Signed)
Patient needs movi prep 

## 2013-07-18 MED ORDER — PEG-KCL-NACL-NASULF-NA ASC-C 100 G PO SOLR: 1.0000 | Freq: Once | ORAL | Status: DC

## 2013-07-31 ENCOUNTER — Telehealth (INDEPENDENT_AMBULATORY_CARE_PROVIDER_SITE_OTHER): Payer: Self-pay | Admitting: *Deleted

## 2013-07-31 NOTE — Telephone Encounter (Signed)
agree

## 2013-07-31 NOTE — Telephone Encounter (Signed)
  Procedure: tcs  Reason/Indication:  Hx polyps  Has patient had this procedure before?  1/14 (EPIC)  If so, when, by whom and where?    Is there a family history of colon cancer?  no  Who?  What age when diagnosed?    Is patient diabetic?   no      Does patient have prosthetic heart valve?  no  Do you have a pacemaker?  no  Has patient ever had endocarditis? no  Has patient had joint replacement within last 12 months?  no  Is patient on Coumadin, Plavix and/or Aspirin? yes  Medications: see EPIC  Allergies: nkda  Medication Adjustment: asa 2 days  Procedure date & time: 08/21/13 at 1030

## 2013-08-06 ENCOUNTER — Encounter (HOSPITAL_COMMUNITY): Payer: Self-pay | Admitting: Pharmacy Technician

## 2013-08-08 DIAGNOSIS — M542 Cervicalgia: Secondary | ICD-10-CM | POA: Insufficient documentation

## 2013-08-08 DIAGNOSIS — M5136 Other intervertebral disc degeneration, lumbar region: Secondary | ICD-10-CM | POA: Insufficient documentation

## 2013-08-08 DIAGNOSIS — M545 Low back pain, unspecified: Secondary | ICD-10-CM | POA: Insufficient documentation

## 2013-08-21 ENCOUNTER — Encounter (HOSPITAL_COMMUNITY)
Admission: RE | Disposition: A | Discharge status: Home / Self Care | Payer: Self-pay | Source: Ambulatory Visit | Attending: Internal Medicine

## 2013-08-21 ENCOUNTER — Ambulatory Visit (HOSPITAL_COMMUNITY)
Admission: RE | Admit: 2013-08-21 | Discharge: 2013-08-21 | Disposition: A | Discharge status: Home / Self Care | Payer: BC Managed Care – PPO | Source: Ambulatory Visit | Attending: Internal Medicine | Admitting: Internal Medicine

## 2013-08-21 ENCOUNTER — Encounter (HOSPITAL_COMMUNITY): Payer: Self-pay | Admitting: *Deleted

## 2013-08-21 DIAGNOSIS — E119 Type 2 diabetes mellitus without complications: Secondary | ICD-10-CM | POA: Diagnosis not present

## 2013-08-21 DIAGNOSIS — K644 Residual hemorrhoidal skin tags: Secondary | ICD-10-CM | POA: Insufficient documentation

## 2013-08-21 DIAGNOSIS — K5730 Diverticulosis of large intestine without perforation or abscess without bleeding: Secondary | ICD-10-CM

## 2013-08-21 DIAGNOSIS — D126 Benign neoplasm of colon, unspecified: Secondary | ICD-10-CM

## 2013-08-21 DIAGNOSIS — Z1211 Encounter for screening for malignant neoplasm of colon: Secondary | ICD-10-CM

## 2013-08-21 DIAGNOSIS — Z860100 Personal history of colon polyps, unspecified: Secondary | ICD-10-CM | POA: Insufficient documentation

## 2013-08-21 DIAGNOSIS — I10 Essential (primary) hypertension: Secondary | ICD-10-CM | POA: Diagnosis not present

## 2013-08-21 HISTORY — PX: COLONOSCOPY: SHX5424

## 2013-08-21 HISTORY — DX: Polyp of colon: K63.5

## 2013-08-21 SURGERY — COLONOSCOPY
Anesthesia: Moderate Sedation

## 2013-08-21 MED ORDER — MIDAZOLAM HCL 5 MG/5ML IJ SOLN
INTRAMUSCULAR | Status: AC
  Filled 2013-08-21: qty 10

## 2013-08-21 MED ORDER — MIDAZOLAM HCL 5 MG/5ML IJ SOLN
INTRAMUSCULAR | Status: DC | PRN
  Administered 2013-08-21 (×2): 2 mg via INTRAVENOUS
  Administered 2013-08-21: 1 mg via INTRAVENOUS

## 2013-08-21 MED ORDER — STERILE WATER FOR IRRIGATION IR SOLN
Status: DC | PRN
  Administered 2013-08-21: 10:00:00

## 2013-08-21 MED ORDER — MEPERIDINE HCL 50 MG/ML IJ SOLN
INTRAMUSCULAR | Status: AC
  Filled 2013-08-21: qty 1

## 2013-08-21 MED ORDER — SODIUM CHLORIDE 0.9 % IV SOLN: INTRAVENOUS | Status: DC

## 2013-08-21 MED ORDER — MEPERIDINE HCL 50 MG/ML IJ SOLN
INTRAMUSCULAR | Status: DC | PRN
  Administered 2013-08-21 (×2): 25 mg via INTRAVENOUS

## 2013-08-21 MED ORDER — SODIUM CHLORIDE 0.9 % IV BOLUS (SEPSIS)
500.0000 mL | Freq: Once | INTRAVENOUS | Status: AC
  Administered 2013-08-21: 11:00:00 via INTRAVENOUS

## 2013-08-21 NOTE — Op Note (Signed)
COLONOSCOPY PROCEDURE REPORT  PATIENT:  [name]  MR#:  [id number] Birthdate:  December 12, 1946, 67 y.o., male Endoscopist:  Dr. Malissa Hippo, MD Referred By:  Dr. Ignatius Specking, MD Procedure Date: 08/21/2013  Procedure:   Colonoscopy  Indications:  Patient is 67 year old Caucasian male who underwent screening colonoscopy in general the 2014 and had flat cecal polyp removed by piecemeal polypectomy and APC. It was tubular adenoma. Patient had another large tubular adenoma removed from sigmoid colon along with smaller adenoma. He is returning for repeat examination to make sure he does not have residual cecal polyp.  Informed Consent:  The procedure and risks were reviewed with the patient and informed consent was obtained.  Medications:  Demerol 50 mg IV Versed 5 mg IV  Description of procedure:  After a digital rectal exam was performed, that colonoscope was advanced from the anus through the rectum and colon to the area of the cecum, ileocecal valve and appendiceal orifice. The cecum was deeply intubated. These structures were well-seen and photographed for the record. From the level of the cecum and ileocecal valve, the scope was slowly and cautiously withdrawn. The mucosal surfaces were carefully surveyed utilizing scope tip to flexion to facilitate fold flattening as needed. The scope was pulled down into the rectum where a thorough exam including retroflexion was performed.  Findings:   Prep satisfactory. Small cecal polyp ablated via cold biopsy. Was felt to be residual polyp. This polyp was lost during retrieval. three small polyps ablated via cold biopsy and submitted together. These are located at hepatic flexure, transverse colon and splenic flexure. Few small diverticula and sigmoid colon. Small hemorrhoids below the dentate line.   Therapeutic/Diagnostic Maneuvers Performed:  See above  Complications:  None  Cecal Withdrawal Time:  13 minutes  Impression:   Examination performed to cecum. Small residual polyp at cecum ablated via cold biopsy. This polyp was lost. three small polyps ablated via cold biopsy and submitted together(hepatic and splenic flexure and transverse colon). Mild sigmoid colon diverticulosis. External hemorrhoids.  Recommendations:  Standard instructions given. I will contact patient with biopsy results and further recommendations.  Hibo Blasdell U  08/21/2013 10:36 AM  CC: Dr. Ignatius Specking., MD & Dr. Bonnetta Barry ref. provider found

## 2013-08-21 NOTE — H&P (Signed)
[name] is an 67 y.o. male.   Chief Complaint: Patient is here for colonoscopy. HPI: Patient is 67 year old Caucasian male who underwent screening colonoscopy generally this year. He was found to have large sessile polyp at cecum which was tubular adenoma. This polyp was treated with piecemeal polypectomy and APC therapy. He had another large tubular adenoma removed. Third polyp was also tubular adenoma. He's returning for followup exam to make sure he does not have residual cecal polyp. He feels fine. No history of abdominal pain rectal bleeding. Family history is negative for CRC.  Past Medical History  Diagnosis Date  . Hypertension   . Cellulitis and abscess of other specified site   . Diabetes mellitus     Pt denies  . Colon polyps     Past Surgical History  Procedure Laterality Date  . Colonscopy    . Back surgery    . Colonoscopy  01/10/2013    Procedure: COLONOSCOPY;  Surgeon: Malissa Hippo, MD;  Location: [facility];  Service: Endoscopy;  Laterality: N/A;  730    Family History  Problem Relation Age of Onset  . Cancer Other     Family Hx of Cancer, CAD,Diabetes,Kidney Failure  . Colon cancer Neg Hx    Social History:  reports that he quit smoking about 16 years ago. His smoking use included Cigarettes. He has a 90 pack-year smoking history. He has never used smokeless tobacco. He reports that  drinks alcohol. He reports that he does not use illicit drugs.  Allergies: No Known Allergies  Medications Prior to Admission  Medication Sig Dispense Refill  . aspirin 81 MG tablet Take 81 mg by mouth daily.      Marland Kitchen gabapentin (NEURONTIN) 300 MG capsule Take 300 mg by mouth 2 (two) times daily.      . Hydrocodone-Acetaminophen 5-300 MG TABS Take 1 tablet by mouth 4 (four) times daily as needed.  30 each  0  . lisinopril-hydrochlorothiazide (PRINZIDE,ZESTORETIC) 10-12.5 MG per tablet Take 1 tablet by mouth daily.  90 tablet  3  . metoprolol succinate (TOPROL-XL) 25 MG 24  hr tablet Take 1 tablet (25 mg total) by mouth daily. Take with or immediately following a meal.  30 tablet  11  . nabumetone (RELAFEN) 500 MG tablet Take 500 mg by mouth daily.      . peg 3350 powder (MOVIPREP) 100 G SOLR Take 1 kit (200 g total) by mouth once.  1 kit  0  . simvastatin (ZOCOR) 40 MG tablet TAKE 1 TABLET (40 MG TOTAL) BY MOUTH AT BEDTIME.  30 tablet  2    No results found for this or any previous visit (from the past 48 hour(s)). No results found.  ROS  Blood pressure 111/71, pulse 67, temperature 97.5 F (36.4 C), temperature source Oral, resp. rate 20, height 6' (1.829 m), weight 192 lb (87.091 kg), SpO2 98.00%. Physical Exam  Constitutional: He appears well-developed and well-nourished.  HENT:  Mouth/Throat: Oropharynx is clear and moist.  Eyes: Conjunctivae are normal. No scleral icterus.  Neck: No thyromegaly present.  Cardiovascular: Normal rate, regular rhythm and normal heart sounds.   No murmur heard. Respiratory: Effort normal and breath sounds normal.  GI: Soft. He exhibits no distension and no mass. There is no tenderness.  Musculoskeletal: He exhibits no edema.  Lymphadenopathy:    He has no cervical adenopathy.  Neurological: He is alert.  Skin: Skin is warm and dry.     Assessment/Plan History  of colonic adenomas with large cecal adenoma. Surveillance colonoscopy.  [name] U 08/21/2013, 10:04 AM

## 2013-08-25 ENCOUNTER — Other Ambulatory Visit: Payer: Self-pay | Admitting: Physician Assistant

## 2013-08-25 ENCOUNTER — Encounter (INDEPENDENT_AMBULATORY_CARE_PROVIDER_SITE_OTHER): Payer: Self-pay | Admitting: *Deleted

## 2014-07-01 DIAGNOSIS — M419 Scoliosis, unspecified: Secondary | ICD-10-CM | POA: Insufficient documentation

## 2014-07-15 ENCOUNTER — Encounter: Payer: Self-pay | Admitting: Cardiovascular Disease

## 2014-07-15 ENCOUNTER — Ambulatory Visit (INDEPENDENT_AMBULATORY_CARE_PROVIDER_SITE_OTHER): Payer: Medicare Other | Admitting: Cardiovascular Disease

## 2014-07-15 VITALS — BP 113/70 | HR 60 | Ht 72.0 in | Wt 206.0 lb

## 2014-07-15 DIAGNOSIS — Z0389 Encounter for observation for other suspected diseases and conditions ruled out: Secondary | ICD-10-CM

## 2014-07-15 DIAGNOSIS — IMO0001 Reserved for inherently not codable concepts without codable children: Secondary | ICD-10-CM

## 2014-07-15 DIAGNOSIS — E785 Hyperlipidemia, unspecified: Secondary | ICD-10-CM

## 2014-07-15 DIAGNOSIS — Z789 Other specified health status: Secondary | ICD-10-CM

## 2014-07-15 DIAGNOSIS — I10 Essential (primary) hypertension: Secondary | ICD-10-CM

## 2014-07-15 NOTE — Patient Instructions (Signed)
   Continue all current medications.  Your physician recommends that you schedule a follow-up appointment in 1 year. You will receive a letter 2 months prior, please call the office to schedule.

## 2014-07-15 NOTE — Progress Notes (Signed)
Patient ID: [name], male   DOB: [birth year], 68 y.o.   MRN: [id number]      SUBJECTIVE: The patient is a 68 yr old male with a history of essential hypertension and hyperlipidemia. His cholesterol when checked by Dr. Woody Seller (PCP) in June was less than 200, and triglycerides were 288. The patient denies any symptoms of chest pain, palpitations, shortness of breath, lightheadedness, dizziness, leg swelling, orthopnea, PND, and syncope. 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 He tries to stay active.  ECG performed in the office today demonstrates normal sinus rhythm with early repolarization with an ascending ST segment.  No Known Allergies  Current Outpatient Prescriptions  Medication Sig Dispense Refill  . aspirin 81 MG tablet Take 81 mg by mouth daily.      Marland Kitchen gabapentin (NEURONTIN) 300 MG capsule Take 300 mg by mouth 3 (three) times daily.       Marland Kitchen HYDROcodone-acetaminophen (NORCO/VICODIN) 5-325 MG per tablet Take 1 tablet by mouth as needed.      Marland Kitchen lisinopril-hydrochlorothiazide (PRINZIDE,ZESTORETIC) 10-12.5 MG per tablet Take 1 tablet by mouth daily.  90 tablet  3  . metoprolol succinate (TOPROL-XL) 25 MG 24 hr tablet Take 1 tablet (25 mg total) by mouth daily. Take with or immediately following a meal.  30 tablet  11  . nabumetone (RELAFEN) 500 MG tablet Take 500 mg by mouth 2 (two) times daily.       . simvastatin (ZOCOR) 40 MG tablet TAKE 1 TABLET (40 MG TOTAL) BY MOUTH AT BEDTIME.  30 tablet  8   No current facility-administered medications for this visit.    Past Medical History  Diagnosis Date  . Hypertension   . Cellulitis and abscess of other specified site   . Diabetes mellitus     Pt denies  . Colon polyps     Past Surgical History  Procedure Laterality Date  . Colonscopy    . Back surgery    . Colonoscopy  01/10/2013    Procedure: COLONOSCOPY;  Surgeon: Rogene Houston, MD;  Location: [facility];  Service:  Endoscopy;  Laterality: N/A;  730  . Colonoscopy N/A 08/21/2013    Procedure: COLONOSCOPY;  Surgeon: Rogene Houston, MD;  Location: [facility];  Service: Endoscopy;  Laterality: N/A;  1030    History   Social History  . Marital Status: Married    Spouse Name: N/A    Number of Children: N/A  . Years of Education: N/A   Occupational History  . 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 Smoking status: Former Smoker -- 3.00 packs/day for 30 years    Types: Cigarettes    Quit date: 12/18/1996  . Smokeless tobacco: Never Used     Comment: started smoking as a child  . Alcohol Use: Yes     Comment: beer  . Drug Use: No  . Sexual Activity: Not on file   Other Topics Concern  . Not on file   Social History Narrative  . No narrative on file     Filed Vitals:   07/15/14 0805  BP: 113/70  Pulse: 60  Height: 6' (1.829 m)  Weight: 206 lb (93.441 kg)    PHYSICAL EXAM General: NAD Neck: No JVD, no thyromegaly. Lungs: Clear to auscultation bilaterally with normal respiratory effort. CV: Nondisplaced PMI.  Regular rate and rhythm, normal S1/S2, no S3/S4, no murmur. No pretibial or periankle  edema.  No carotid bruit.  Normal pedal pulses.  Abdomen: Soft, nontender, no hepatosplenomegaly, no distention.  Neurologic: Alert and oriented x 3.  Psych: Normal affect. Extremities: No clubbing or cyanosis.   ECG: reviewed and available in electronic records.      ASSESSMENT AND PLAN: 1. Essential HTN: Well controlled on current therapy. No changes to management. 2. Hyperlipidemia: Mildly elevated triglycerides. Thus far, this has been managed by PCP. 3. Cardiovascular screening: The patient is asymptomatic. ECG is a benign variant of early repolarization with an ascending ST segment.  Dispo: I offered the option of following up as needed, but he prefers to follow up annually. Will make a return visit in one year.  Kate Sable, M.D., F.A.C.C.

## 2014-08-29 ENCOUNTER — Emergency Department (HOSPITAL_COMMUNITY)
Admission: EM | Admit: 2014-08-29 | Discharge: 2014-08-30 | Disposition: A | Discharge status: Home / Self Care | Payer: Medicare Other | Attending: Emergency Medicine | Admitting: Emergency Medicine

## 2014-08-29 ENCOUNTER — Encounter (HOSPITAL_COMMUNITY): Payer: Self-pay | Admitting: Emergency Medicine

## 2014-08-29 ENCOUNTER — Emergency Department (HOSPITAL_COMMUNITY): Payer: Medicare Other

## 2014-08-29 DIAGNOSIS — I10 Essential (primary) hypertension: Secondary | ICD-10-CM | POA: Insufficient documentation

## 2014-08-29 DIAGNOSIS — Z79899 Other long term (current) drug therapy: Secondary | ICD-10-CM | POA: Insufficient documentation

## 2014-08-29 DIAGNOSIS — Z87891 Personal history of nicotine dependence: Secondary | ICD-10-CM | POA: Diagnosis not present

## 2014-08-29 DIAGNOSIS — E119 Type 2 diabetes mellitus without complications: Secondary | ICD-10-CM | POA: Diagnosis not present

## 2014-08-29 DIAGNOSIS — Z860100 Personal history of colon polyps, unspecified: Secondary | ICD-10-CM | POA: Insufficient documentation

## 2014-08-29 DIAGNOSIS — Z7982 Long term (current) use of aspirin: Secondary | ICD-10-CM | POA: Insufficient documentation

## 2014-08-29 DIAGNOSIS — R079 Chest pain, unspecified: Secondary | ICD-10-CM | POA: Diagnosis present

## 2014-08-29 DIAGNOSIS — Z872 Personal history of diseases of the skin and subcutaneous tissue: Secondary | ICD-10-CM | POA: Insufficient documentation

## 2014-08-29 LAB — CBC
HCT: 40.1 % (ref 39.0–52.0)
Hemoglobin: 13.8 g/dL (ref 13.0–17.0)
MCH: 29.1 pg (ref 26.0–34.0)
MCHC: 34.4 g/dL (ref 30.0–36.0)
MCV: 84.6 fL (ref 78.0–100.0)
PLATELETS: 183 10*3/uL (ref 150–400)
RBC: 4.74 MIL/uL (ref 4.22–5.81)
RDW: 13.9 % (ref 11.5–15.5)
WBC: 6.7 10*3/uL (ref 4.0–10.5)

## 2014-08-29 LAB — BASIC METABOLIC PANEL
ANION GAP: 11 (ref 5–15)
BUN: 20 mg/dL (ref 6–23)
CALCIUM: 9.6 mg/dL (ref 8.4–10.5)
CO2: 28 meq/L (ref 19–32)
CREATININE: 1.19 mg/dL (ref 0.50–1.35)
Chloride: 100 mEq/L (ref 96–112)
GFR, EST AFRICAN AMERICAN: 72 mL/min — AB (ref >90)
GFR, EST NON AFRICAN AMERICAN: 62 mL/min — AB (ref >90)
Glucose, Bld: 117 mg/dL — ABNORMAL HIGH (ref 70–99)
Potassium: 3.6 mEq/L — ABNORMAL LOW (ref 3.7–5.3)
SODIUM: 139 meq/L (ref 137–147)

## 2014-08-29 LAB — TROPONIN I

## 2014-08-29 NOTE — ED Provider Notes (Signed)
CSN: [id number]     Arrival date & time 08/29/14  2238 History   First MD Initiated Contact with Patient 08/29/14 2321    This chart was scribed for Veryl Speak, MD by Terressa Koyanagi, ED Scribe. This patient was seen in room APA06/[facility]06 and the patient's care was started at 11:23 PM.  Chief Complaint  Patient presents with  . Chest Pain   The history is provided by the patient. No language interpreter was used.   HPI Comments: [name] is a 68 y.o. male, with Hx of high cholesterol (for which he takes meds daily), who presents to the Emergency Department complaining of left sided chest pain. Pt reports having two episodes of chest pain today: the first episode occurred this morning while pt was fishing, lasted approximately 30 minutes and resolved after pt took a bayer aspirin; the second episode occurred around 10pm tonight while pt was in the shower. Pt describes the pain as a sharp pain and specifies that he feels no pain if he stands completely still, however, the pain is aggravated with any movement.  Pt denies SOB, radiating pain, diaphoresis, nausea, dizziness or Hx of heart problems.   Past Medical History  Diagnosis Date  . Hypertension   . Cellulitis and abscess of other specified site   . Diabetes mellitus     Pt denies  . Colon polyps    Past Surgical History  Procedure Laterality Date  . Colonscopy    . Back surgery    . Colonoscopy  01/10/2013    Procedure: COLONOSCOPY;  Surgeon: Rogene Houston, MD;  [facility];  Service: Endoscopy;  Laterality: N/A;  730  . Colonoscopy N/A 08/21/2013    Procedure: COLONOSCOPY;  Surgeon: Rogene Houston, MD;  [facility];  Service: Endoscopy;  Laterality: N/A;  1030   Family History  Problem Relation Age of Onset  . Cancer Other     Family Hx of Cancer, CAD,Diabetes,Kidney Failure  . Colon cancer Neg Hx    History  Substance Use Topics  . Smoking status: Former Smoker -- 3.00 packs/day for 30 years   Types: Cigarettes    Quit date: 12/18/1996  . Smokeless tobacco: Never Used     Comment: started smoking as a child  . Alcohol Use: Yes     Comment: beer    Review of Systems  Constitutional: Negative for fever, chills and diaphoresis.  Respiratory: Negative for shortness of breath.   Cardiovascular: Positive for chest pain.  Gastrointestinal: Negative for nausea and vomiting.  Neurological: Negative for dizziness.  Psychiatric/Behavioral: Negative for confusion.  All other systems reviewed and are negative.    Allergies  Review of patient's allergies indicates no known allergies.  Home Medications   Prior to Admission medications   Medication Sig Start Date End Date Taking? Authorizing Provider  aspirin 81 MG tablet Take 81 mg by mouth daily.    Historical Provider, MD  gabapentin (NEURONTIN) 300 MG capsule Take 300 mg by mouth 3 (three) times daily.     Historical Provider, MD  HYDROcodone-acetaminophen (NORCO/VICODIN) 5-325 MG per tablet Take 1 tablet by mouth as needed. 07/01/14   Historical Provider, MD  lisinopril-hydrochlorothiazide (PRINZIDE,ZESTORETIC) 10-12.5 MG per tablet Take 1 tablet by mouth daily. 04/08/12   Ezra Sites, MD  metoprolol succinate (TOPROL-XL) 25 MG 24 hr tablet Take 1 tablet (25 mg total) by mouth daily. Take with or immediately following a meal. 06/25/13   Lenise Herald  Bronson Ing, MD  nabumetone (RELAFEN) 500 MG tablet Take 500 mg by mouth 2 (two) times daily.     Historical Provider, MD  simvastatin (ZOCOR) 40 MG tablet TAKE 1 TABLET (40 MG TOTAL) BY MOUTH AT BEDTIME. 08/25/13   Herminio Commons, MD   Triage Vitals: BP 123/73  Pulse 57  Temp(Src) 98.1 F (36.7 C) (Oral)  Resp 15  Ht 6' (1.829 m)  Wt 207 lb (93.895 kg)  BMI 28.07 kg/m2  SpO2 100% Physical Exam  Nursing note and vitals reviewed. Constitutional: He is oriented to person, place, and time. He appears well-developed and well-nourished. No distress.  HENT:  Head: Normocephalic and  atraumatic.  Eyes: Conjunctivae and EOM are normal.  Neck: Neck supple. No tracheal deviation present.  Cardiovascular: Normal rate.   Pulmonary/Chest: Effort normal. No respiratory distress.  Musculoskeletal: Normal range of motion.  Neurological: He is alert and oriented to person, place, and time.  Skin: Skin is warm and dry.  Psychiatric: He has a normal mood and affect. His behavior is normal.    ED Course  Procedures (including critical care time) DIAGNOSTIC STUDIES: Oxygen Saturation is 100% on RA, nl by my interpretation.    COORDINATION OF CARE: 11:28 PM-Discussed treatment plan which includes discussing EKG results and labs with pt at bedside and pt agreed to plan.   Labs Review Labs Reviewed  CBC  BASIC METABOLIC PANEL  TROPONIN I    Imaging Review Dg Chest Port 1 View  08/29/2014   CLINICAL DATA:  Chest pain.  EXAM: PORTABLE CHEST - 1 VIEW  COMPARISON:  None.  FINDINGS: The cardiac silhouette, mediastinal and hilar contours are normal. Lungs are clear. No pleural effusion. The bony thorax is intact.  IMPRESSION: No acute cardiopulmonary findings.   Electronically Signed   By: Kalman Jewels M.D.   On: 08/29/2014 23:15     EKG Interpretation   Date/Time:  Saturday August 29 2014 22:48:43 EDT Ventricular Rate:  59 PR Interval:  182 QRS Duration: 100 QT Interval:  409 QTC Calculation: 405 R Axis:   65 Text Interpretation:  Sinus rhythm ST elevation suggests acute  pericarditis Baseline wander in lead(s) V3 No significant change since  02/17/03 Confirmed by Beau Fanny  MD, Tamaiya Bump ([id number]) on 08/29/2014 11:03:57 PM      MDM   Final diagnoses:  None    Patient is a 68 year old male with no prior cardiac history. He presents tonight with complaints of sharp pains in the front of his chest. He had one episode this morning which resolved an additional episode this evening approximately an hour and half prior to coming here. His discomfort is worse when sitting  forward and movement and is not associated with nausea, diaphoresis, shortness of breath, or radiation to his arm or jaw. He reports no exertional symptoms.  Workup reveals an unchanged EKG, negative chest x-ray, and negative troponin x2. I feel as though he is appropriate for discharge with cardiology followup to discuss a possible stress test. He has a cardiologist I will advise him to contact him on Monday to arrange this appointment. He understands to return if his symptoms worsen or change.  I personally performed the services described in this documentation, which was scribed in my presence. The recorded information has been reviewed and is accurate.      Veryl Speak, MD 08/30/14 [phone]

## 2014-08-29 NOTE — ED Notes (Signed)
Pt c/o left-sided chest pain. States it happened earlier tonight and then went away. Came back tonight, and hurts more when he bends over. Denies any SOB, N/V, dizziness.

## 2014-08-30 DIAGNOSIS — R079 Chest pain, unspecified: Secondary | ICD-10-CM | POA: Diagnosis not present

## 2014-08-30 LAB — TROPONIN I: Troponin I: <0.3 ng/mL (ref <0.30)

## 2014-08-30 NOTE — Discharge Instructions (Signed)
Contact your cardiologist on Monday to arrange a followup appointment.  Return to the emergency department in the meantime if your symptoms substantially worsen or change.   Chest Pain (Nonspecific) It is often hard to give a specific diagnosis for the cause of chest pain. There is always a chance that your pain could be related to something serious, such as a heart attack or a blood clot in the lungs. You need to follow up with your health care provider for further evaluation. CAUSES   Heartburn.  Pneumonia or bronchitis.  Anxiety or stress.  Inflammation around your heart (pericarditis) or lung (pleuritis or pleurisy).  A blood clot in the lung.  A collapsed lung (pneumothorax). It can develop suddenly on its own (spontaneous pneumothorax) or from trauma to the chest.  Shingles infection (herpes zoster virus). The chest wall is composed of bones, muscles, and cartilage. Any of these can be the source of the pain.  The bones can be bruised by injury.  The muscles or cartilage can be strained by coughing or overwork.  The cartilage can be affected by inflammation and become sore (costochondritis). DIAGNOSIS  Lab tests or other studies may be needed to find the cause of your pain. Your health care provider may have you take a test called an ambulatory electrocardiogram (ECG). An ECG records your heartbeat patterns over a 24-hour period. You may also have other tests, such as:  Transthoracic echocardiogram (TTE). During echocardiography, sound waves are used to evaluate how blood flows through your heart.  Transesophageal echocardiogram (TEE).  Cardiac monitoring. This allows your health care provider to monitor your heart rate and rhythm in real time.  Holter monitor. This is a portable device that records your heartbeat and can help diagnose heart arrhythmias. It allows your health care provider to track your heart activity for several days, if needed.  Stress tests by  exercise or by giving medicine that makes the heart beat faster. TREATMENT   Treatment depends on what may be causing your chest pain. Treatment may include:  Acid blockers for heartburn.  Anti-inflammatory medicine.  Pain medicine for inflammatory conditions.  Antibiotics if an infection is present.  You may be advised to change lifestyle habits. This includes stopping smoking and avoiding alcohol, caffeine, and chocolate.  You may be advised to keep your head raised (elevated) when sleeping. This reduces the chance of acid going backward from your stomach into your esophagus. Most of the time, nonspecific chest pain will improve within 2-3 days with rest and mild pain medicine.  HOME CARE INSTRUCTIONS   If antibiotics were prescribed, take them as directed. Finish them even if you start to feel better.  For the next few days, avoid physical activities that bring on chest pain. Continue physical activities as directed.  Do not use any tobacco products, including cigarettes, chewing tobacco, or electronic cigarettes.  Avoid drinking alcohol.  Only take medicine as directed by your health care provider.  Follow your health care provider's suggestions for further testing if your chest pain does not go away.  Keep any follow-up appointments you made. If you do not go to an appointment, you could develop lasting (chronic) problems with pain. If there is any problem keeping an appointment, call to reschedule. SEEK MEDICAL CARE IF:   Your chest pain does not go away, even after treatment.  You have a rash with blisters on your chest.  You have a fever. SEEK IMMEDIATE MEDICAL CARE IF:   You have increased chest  pain or pain that spreads to your arm, neck, jaw, back, or abdomen. °· You have shortness of breath. °· You have an increasing cough, or you cough up blood. °· You have severe back or abdominal pain. °· You feel nauseous or vomit. °· You have severe weakness. °· You  faint. °· You have chills. °This is an emergency. Do not wait to see if the pain will go away. Get medical help at once. Call your local emergency services (911 in [place] Do not drive yourself to the hospital. °MAKE SURE YOU:  °· Understand these instructions. °· Will watch your condition. °· Will get help right away if you are not doing well or get worse. °Document Released: 09/13/2005 Document Revised: 12/09/2013 Document Reviewed: 07/09/2008 °ExitCare® Patient Information ©2015 [employer]. This information is not intended to replace advice given to you by your health care provider. Make sure you discuss any questions you have with your health care provider. ° °

## 2014-09-01 ENCOUNTER — Encounter: Payer: Self-pay | Admitting: *Deleted

## 2014-09-01 ENCOUNTER — Encounter: Payer: Self-pay | Admitting: Cardiovascular Disease

## 2014-09-01 ENCOUNTER — Ambulatory Visit (INDEPENDENT_AMBULATORY_CARE_PROVIDER_SITE_OTHER): Payer: Medicare Other | Admitting: Cardiovascular Disease

## 2014-09-01 VITALS — BP 106/70 | HR 74 | Ht 72.0 in | Wt 208.0 lb

## 2014-09-01 DIAGNOSIS — R072 Precordial pain: Secondary | ICD-10-CM

## 2014-09-01 DIAGNOSIS — R9431 Abnormal electrocardiogram [ECG] [EKG]: Secondary | ICD-10-CM

## 2014-09-01 DIAGNOSIS — I10 Essential (primary) hypertension: Secondary | ICD-10-CM

## 2014-09-01 DIAGNOSIS — Z87898 Personal history of other specified conditions: Secondary | ICD-10-CM

## 2014-09-01 DIAGNOSIS — Z9289 Personal history of other medical treatment: Secondary | ICD-10-CM

## 2014-09-01 DIAGNOSIS — E785 Hyperlipidemia, unspecified: Secondary | ICD-10-CM

## 2014-09-01 DIAGNOSIS — I309 Acute pericarditis, unspecified: Secondary | ICD-10-CM

## 2014-09-01 DIAGNOSIS — R079 Chest pain, unspecified: Secondary | ICD-10-CM | POA: Insufficient documentation

## 2014-09-01 NOTE — Progress Notes (Signed)
Patient ID: [name], male   DOB: [birth year], 68 y.o.   MRN: [id number]      SUBJECTIVE: 68 yr old male with HTN and hyperlipidemia and h/o abnormal ECG, with early repolarization with ascending ST segment. This past Saturday while fishing, he experienced the sudden onset of chest pain accompanied by shortness of breath, made worse by leaning forward. It lasted for 90 minutes and subsided on its own. He had a recurrence the following day but it was short lived, with resolution after taking a baby ASA.  Pain was aggravated by movement but resolved with sitting still. He denies associated nausea, vomiting, palpitations, and lightheadedness. He was evaluated in the ED. I reviewed the ECG and findings are consistent with pericarditis (more PR elevation in aVR than prior ECG from July, with PR depression in precordial leads and diffuse ST elevation). He denies fevers and chills, cough, rhinorrhea, nor any other symptoms to suggest a viral prodrome. CXR was normal. Troponin normal. Potassium mildly low at 3.6. 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  He is here with his wife, [name].    Review of Systems: As per "subjective", otherwise negative.  No Known Allergies  Current Outpatient Prescriptions  Medication Sig Dispense Refill  . aspirin 81 MG tablet Take 81 mg by mouth daily.      Marland Kitchen gabapentin (NEURONTIN) 300 MG capsule Take 300 mg by mouth 3 (three) times daily.       Marland Kitchen HYDROcodone-acetaminophen (NORCO/VICODIN) 5-325 MG per tablet Take 1 tablet by mouth as needed.      Marland Kitchen lisinopril-hydrochlorothiazide (PRINZIDE,ZESTORETIC) 10-12.5 MG per tablet Take 1 tablet by mouth daily.  90 tablet  3  . metoprolol succinate (TOPROL-XL) 25 MG 24 hr tablet Take 1 tablet (25 mg total) by mouth daily. Take with or immediately following a meal.  30 tablet  11  . nabumetone (RELAFEN) 500 MG tablet Take 500 mg by mouth 2 (two)  times daily.       . simvastatin (ZOCOR) 40 MG tablet TAKE 1 TABLET (40 MG TOTAL) BY MOUTH AT BEDTIME.  30 tablet  8   No current facility-administered medications for this visit.    Past Medical History  Diagnosis Date  . Hypertension   . Cellulitis and abscess of other specified site   . Diabetes mellitus     Pt denies  . Colon polyps     Past Surgical History  Procedure Laterality Date  . Colonscopy    . Back surgery    . Colonoscopy  01/10/2013    Procedure: COLONOSCOPY;  Surgeon: Rogene Houston, MD;  [facility];  Service: Endoscopy;  Laterality: N/A;  730  . Colonoscopy N/A 08/21/2013    Procedure: COLONOSCOPY;  Surgeon: Rogene Houston, MD;  [facility];  Service: Endoscopy;  Laterality: N/A;  1030    History   Social History  . Marital Status: Married    Spouse Name: N/A    Number of Children: N/A  . Years of Education: N/A   Occupational History  . 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 Smoking status: Former Smoker -- 3.00 packs/day for 30 years    Types: Cigarettes    Quit date: 12/18/1996  . Smokeless tobacco: Never Used     Comment: started smoking as a child  . Alcohol Use: Yes     Comment: beer  . Drug  Use: No  . Sexual Activity: Not on file   Other Topics Concern  . Not on file   Social History Narrative  . No narrative on file     Filed Vitals:   09/01/14 1448  BP: 106/70  Pulse: 74  Height: 6' (1.829 m)  Weight: 208 lb (94.348 kg)  SpO2: 98%    PHYSICAL EXAM General: NAD HEENT: Normal. Neck: No JVD, no thyromegaly. Lungs: Clear to auscultation bilaterally with normal respiratory effort. CV: Nondisplaced PMI.  Regular rate and rhythm, normal S1/S2, no S3/S4, no murmur. No pretibial or periankle edema.  No carotid bruit.  Normal pedal pulses.  Abdomen: Soft, nontender, no hepatosplenomegaly, no distention.  Neurologic: Alert and oriented x 3.  Psych: Normal affect. Skin: Normal. Musculoskeletal: Normal  range of motion, no gross deformities. Extremities: No clubbing or cyanosis.   ECG: Most recent ECG reviewed.      ASSESSMENT AND PLAN: 1. Chest pain: Symptoms are atypical for ischemic heart disease in that he denies exertional provocation. Typically, the pain of pericarditis is alleviated by sitting up and leaning forward. His risk factors for CAD include HTN and hypertriglyceridemia. I do not appreciate a pericardial rub. I will obtain an echocardiogram and exercise Cardiolite stress test for further clarification. He denies any viral prodrome. I have advised him to try ibuprofen prn for any chest pain recurrence. 2. Essential HTN: Well controlled on current therapy. 3. Hyperlipidemia: Continue simvastatin 40 mg daily.  Dispo: f/u 1 month.  Time spent: 40 minutes, of which greater than 50% was spent counseling on methods to alleviate chest pain.  Kate Sable, M.D., F.A.C.C.

## 2014-09-01 NOTE — Patient Instructions (Signed)
Your physician recommends that you schedule a follow-up appointment in:  [place] physician recommends that you continue on your current medications as directed. Please refer to the Current Medication list given to you today. Take Ibuprofen as directed by [place] physician has requested that you have an echocardiogram. Echocardiography is a painless test that uses sound waves to create images of your heart. It provides your doctor with information about the size and shape of your heart and how well your heart's chambers and valves are working. This procedure takes approximately one hour. There are no restrictions for this procedure.    Your physician has requested that you have en exercise stress myoview. For further information please visit [email or web address]. Please follow instruction sheet, as given.PLEASE HOLD TOPROL THE MORNING OF TEST       Thank you for choosing [facility] !

## 2014-09-09 ENCOUNTER — Encounter (HOSPITAL_COMMUNITY)
Admission: RE | Admit: 2014-09-09 | Discharge: 2014-09-09 | Disposition: A | Discharge status: Home / Self Care | Payer: Medicare Other | Source: Ambulatory Visit | Attending: Cardiovascular Disease | Admitting: Cardiovascular Disease

## 2014-09-09 ENCOUNTER — Encounter (HOSPITAL_COMMUNITY): Payer: Self-pay

## 2014-09-09 ENCOUNTER — Ambulatory Visit (HOSPITAL_COMMUNITY)
Admission: RE | Admit: 2014-09-09 | Discharge: 2014-09-09 | Disposition: A | Discharge status: Home / Self Care | Payer: Medicare Other | Source: Ambulatory Visit | Attending: Cardiovascular Disease | Admitting: Cardiovascular Disease

## 2014-09-09 ENCOUNTER — Encounter (HOSPITAL_COMMUNITY)
Admission: RE | Admit: 2014-09-09 | Discharge: 2014-09-09 | Disposition: A | Discharge status: Home / Self Care | Payer: Medicare Other | Source: Ambulatory Visit | Attending: Cardiology | Admitting: Cardiology

## 2014-09-09 DIAGNOSIS — E119 Type 2 diabetes mellitus without complications: Secondary | ICD-10-CM | POA: Diagnosis not present

## 2014-09-09 DIAGNOSIS — I2510 Atherosclerotic heart disease of native coronary artery without angina pectoris: Secondary | ICD-10-CM | POA: Diagnosis not present

## 2014-09-09 DIAGNOSIS — Z87891 Personal history of nicotine dependence: Secondary | ICD-10-CM | POA: Insufficient documentation

## 2014-09-09 DIAGNOSIS — R079 Chest pain, unspecified: Secondary | ICD-10-CM | POA: Insufficient documentation

## 2014-09-09 DIAGNOSIS — I517 Cardiomegaly: Secondary | ICD-10-CM

## 2014-09-09 DIAGNOSIS — R072 Precordial pain: Secondary | ICD-10-CM

## 2014-09-09 DIAGNOSIS — E785 Hyperlipidemia, unspecified: Secondary | ICD-10-CM | POA: Diagnosis not present

## 2014-09-09 DIAGNOSIS — I059 Rheumatic mitral valve disease, unspecified: Secondary | ICD-10-CM | POA: Insufficient documentation

## 2014-09-09 DIAGNOSIS — R9431 Abnormal electrocardiogram [ECG] [EKG]: Secondary | ICD-10-CM | POA: Insufficient documentation

## 2014-09-09 DIAGNOSIS — I10 Essential (primary) hypertension: Secondary | ICD-10-CM | POA: Insufficient documentation

## 2014-09-09 DIAGNOSIS — I079 Rheumatic tricuspid valve disease, unspecified: Secondary | ICD-10-CM | POA: Diagnosis not present

## 2014-09-09 MED ORDER — TECHNETIUM TC 99M SESTAMIBI GENERIC - CARDIOLITE
10.0000 | Freq: Once | INTRAVENOUS | Status: AC | PRN
  Administered 2014-09-09: 10 via INTRAVENOUS

## 2014-09-09 MED ORDER — TECHNETIUM TC 99M SESTAMIBI - CARDIOLITE
30.0000 | Freq: Once | INTRAVENOUS | Status: AC | PRN
  Administered 2014-09-09: 10:00:00 30 via INTRAVENOUS

## 2014-09-09 MED ORDER — SODIUM CHLORIDE 0.9 % IJ SOLN
INTRAMUSCULAR | Status: AC
  Administered 2014-09-09: 10 mL via INTRAVENOUS
  Filled 2014-09-09: qty 10

## 2014-09-09 MED ORDER — REGADENOSON 0.4 MG/5ML IV SOLN
INTRAVENOUS | Status: AC
  Filled 2014-09-09: qty 5

## 2014-09-09 MED ORDER — SODIUM CHLORIDE 0.9 % IJ SOLN
INTRAMUSCULAR | Status: AC
  Filled 2014-09-09: qty 36

## 2014-09-09 MED ORDER — SODIUM CHLORIDE 0.9 % IJ SOLN
10.0000 mL | INTRAMUSCULAR | Status: DC | PRN
  Administered 2014-09-09: 10 mL via INTRAVENOUS

## 2014-09-09 NOTE — Progress Notes (Signed)
  Echocardiogram 2D Echocardiogram has been performed.  [place], [place] 09/09/2014, 10:17 AM

## 2014-09-09 NOTE — Progress Notes (Signed)
Stress Lab Nurses Notes - [facility]  AB LEAMING 09/09/2014 Reason for doing test: CAD and Chest Pain Type of test: Stress Cardiolite Nurse performing test: Gerrit Halls, RN Nuclear Medicine Tech: Redmond Baseman Echo Tech: Not Applicable MD performing test: Branch/M.Lenze PA Family MD: Vyas Test explained and consent signed: Yes.   IV started: Saline lock flushed, No redness or edema and Saline lock started in radiology Symptoms: SOB Treatment/Intervention: None Reason test stopped: fatigue After recovery IV was: Discontinued via X-ray tech and No redness or edema Patient to return to Nuc. Med at : 9:55 Patient discharged: Home Patient's Condition upon discharge was: stable Comments: During test peak BP 167/80 & HR 151 .  Recovery BP 132/74 & HR 81.  Symptoms resolved in recovery. Geanie Cooley T

## 2014-10-19 ENCOUNTER — Ambulatory Visit (INDEPENDENT_AMBULATORY_CARE_PROVIDER_SITE_OTHER): Payer: Medicare Other | Admitting: Cardiovascular Disease

## 2014-10-19 ENCOUNTER — Encounter: Payer: Self-pay | Admitting: Cardiovascular Disease

## 2014-10-19 VITALS — BP 100/60 | HR 63 | Ht 72.0 in | Wt 207.0 lb

## 2014-10-19 DIAGNOSIS — R072 Precordial pain: Secondary | ICD-10-CM

## 2014-10-19 DIAGNOSIS — I10 Essential (primary) hypertension: Secondary | ICD-10-CM

## 2014-10-19 DIAGNOSIS — R9431 Abnormal electrocardiogram [ECG] [EKG]: Secondary | ICD-10-CM

## 2014-10-19 DIAGNOSIS — I319 Disease of pericardium, unspecified: Secondary | ICD-10-CM

## 2014-10-19 DIAGNOSIS — E785 Hyperlipidemia, unspecified: Secondary | ICD-10-CM

## 2014-10-19 NOTE — Progress Notes (Signed)
Patient ID: [name], male   DOB: 03/29/1946, 68 y.o.   MRN: [id number]      SUBJECTIVE: The patient presents for follow-up after undergoing cardiovascular testing for chest pain. Nuclear stress testing was normal. Echocardiography demonstrated normal left ventricular systolic function with no pericardial effusion and no significant valvular pathology. ECG showed evidence of pericarditis, reviewed at his last visit with me. The patient denies any symptoms of chest pain, palpitations, shortness of breath, lightheadedness, dizziness, leg swelling, orthopnea, PND, and syncope.    Review of Systems: As per "subjective", otherwise negative.  No Known Allergies  Current Outpatient Prescriptions  Medication Sig Dispense Refill  . aspirin 81 MG tablet Take 81 mg by mouth daily.    Marland Kitchen gabapentin (NEURONTIN) 300 MG capsule Take 300 mg by mouth 3 (three) times daily.     Marland Kitchen HYDROcodone-acetaminophen (NORCO/VICODIN) 5-325 MG per tablet Take 1 tablet by mouth as needed.    Marland Kitchen lisinopril-hydrochlorothiazide (PRINZIDE,ZESTORETIC) 10-12.5 MG per tablet Take 1 tablet by mouth daily. 90 tablet 3  . metoprolol succinate (TOPROL-XL) 25 MG 24 hr tablet Take 1 tablet (25 mg total) by mouth daily. Take with or immediately following a meal. 30 tablet 11  . nabumetone (RELAFEN) 500 MG tablet Take 500 mg by mouth 2 (two) times daily.     . simvastatin (ZOCOR) 40 MG tablet TAKE 1 TABLET (40 MG TOTAL) BY MOUTH AT BEDTIME. 30 tablet 8  . EPIPEN 2-PAK 0.3 MG/0.3ML SOAJ injection   3   No current facility-administered medications for this visit.    Past Medical History  Diagnosis Date  . Hypertension   . Cellulitis and abscess of other specified site   . Diabetes mellitus     Pt denies  . Colon polyps     Past Surgical History  Procedure Laterality Date  . Colonscopy    . Back surgery    . Colonoscopy  01/10/2013    Procedure: COLONOSCOPY;  Surgeon: Rogene Houston, MD;  [facility];   Service: Endoscopy;  Laterality: N/A;  730  . Colonoscopy N/A 08/21/2013    Procedure: COLONOSCOPY;  Surgeon: Rogene Houston, MD;  [facility];  Service: Endoscopy;  Laterality: N/A;  1030    History   Social History  . Marital Status: Married    Spouse Name: N/A    Number of Children: N/A  . Years of Education: N/A   Occupational History  . 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 Smoking status: Former Smoker -- 3.00 packs/day for 30 years    Types: Cigarettes    Start date: 12/18/1957    Quit date: 12/18/1996  . Smokeless tobacco: Never Used     Comment: started smoking as a child  . Alcohol Use: 0.0 oz/week    0 Not specified per week     Comment: beer  . Drug Use: No  . Sexual Activity: Not on file   Other Topics Concern  . Not on file   Social History Narrative     Filed Vitals:   10/19/14 1542  BP: 100/60  Pulse: 63  Height: 6' (1.829 m)  Weight: 207 lb (93.895 kg)    PHYSICAL EXAM General: NAD HEENT: Normal. Neck: No JVD, no thyromegaly. Lungs: Clear to auscultation bilaterally with normal respiratory effort. CV: Nondisplaced PMI.  Regular rate and rhythm, normal S1/S2, no S3/S4, no murmur. No pretibial or periankle edema.  No carotid bruit.  Normal pedal pulses.  Abdomen: Soft, nontender, no hepatosplenomegaly, no distention.  Neurologic: Alert and oriented x 3.  Psych: Normal affect. Skin: Normal. Musculoskeletal: Normal range of motion, no gross deformities. Extremities: No clubbing or cyanosis.   ECG: Most recent ECG reviewed.      ASSESSMENT AND PLAN: 1. Chest pain/pericarditis: Prior episodes likely due to pericarditis. Given normal nuclear stress testing and good echocardiographic results, no further testing is indicated. 2. Essential HTN: Well controlled on current therapy. 3. Hyperlipidemia: Continue simvastatin 40 mg daily.  Dispo: f/u prn.   Kate Sable, M.D., F.A.C.C.

## 2014-10-19 NOTE — Patient Instructions (Signed)
Your physician recommends that you schedule a follow-up appointment in: only as needed.       Thank you for choosing [facility] !         

## 2015-07-15 ENCOUNTER — Ambulatory Visit (INDEPENDENT_AMBULATORY_CARE_PROVIDER_SITE_OTHER): Payer: Medicare Other | Admitting: Cardiovascular Disease

## 2015-07-15 ENCOUNTER — Encounter: Payer: Self-pay | Admitting: Cardiovascular Disease

## 2015-07-15 VITALS — BP 108/72 | HR 55 | Ht 72.0 in | Wt 202.0 lb

## 2015-07-15 DIAGNOSIS — E785 Hyperlipidemia, unspecified: Secondary | ICD-10-CM | POA: Diagnosis not present

## 2015-07-15 DIAGNOSIS — R072 Precordial pain: Secondary | ICD-10-CM

## 2015-07-15 DIAGNOSIS — I10 Essential (primary) hypertension: Secondary | ICD-10-CM | POA: Diagnosis not present

## 2015-07-15 DIAGNOSIS — I319 Disease of pericardium, unspecified: Secondary | ICD-10-CM

## 2015-07-15 MED ORDER — SIMVASTATIN 40 MG PO TABS: 40.0000 mg | ORAL_TABLET | Freq: Every day | ORAL | Status: DC

## 2015-07-15 MED ORDER — LISINOPRIL-HYDROCHLOROTHIAZIDE 10-12.5 MG PO TABS: 1.0000 | ORAL_TABLET | Freq: Every day | ORAL | Status: DC

## 2015-07-15 MED ORDER — METOPROLOL SUCCINATE ER 25 MG PO TB24: 25.0000 mg | ORAL_TABLET | Freq: Every day | ORAL | Status: DC

## 2015-07-15 NOTE — Patient Instructions (Signed)
Continue all current medications. Follow up as needed  

## 2015-07-15 NOTE — Addendum Note (Signed)
Addended by: Laurine Blazer on: 07/15/2015 08:39 AM   Modules accepted: Level of Service

## 2015-07-15 NOTE — Progress Notes (Signed)
Patient ID: [name], male   DOB: [birth year], 69 y.o.   MRN: [id number]      SUBJECTIVE: The patient presents for routine follow-up. He previously underwent nuclear stress testing which was normal and a previous echocardiogram demonstrated normal left ventricular systolic function. He is feeling well and stays active and denies chest pain, palpitations, leg swelling, shortness of breath.   Review of Systems: As per "subjective", otherwise negative.  No Known Allergies  Current Outpatient Prescriptions  Medication Sig Dispense Refill  . aspirin 81 MG tablet Take 81 mg by mouth daily.    Marland Kitchen EPIPEN 2-PAK 0.3 MG/0.3ML SOAJ injection   3  . gabapentin (NEURONTIN) 300 MG capsule Take 300 mg by mouth 3 (three) times daily.     Marland Kitchen HYDROcodone-acetaminophen (NORCO/VICODIN) 5-325 MG per tablet Take 1 tablet by mouth as needed.    Marland Kitchen lisinopril-hydrochlorothiazide (PRINZIDE,ZESTORETIC) 10-12.5 MG per tablet Take 1 tablet by mouth daily. 90 tablet 3  . metoprolol succinate (TOPROL-XL) 25 MG 24 hr tablet Take 1 tablet (25 mg total) by mouth daily. Take with or immediately following a meal. 30 tablet 11  . nabumetone (RELAFEN) 500 MG tablet Take 500 mg by mouth 2 (two) times daily.     . simvastatin (ZOCOR) 40 MG tablet TAKE 1 TABLET (40 MG TOTAL) BY MOUTH AT BEDTIME. 30 tablet 8   No current facility-administered medications for this visit.    Past Medical History  Diagnosis Date  . Hypertension   . Cellulitis and abscess of other specified site   . Diabetes mellitus     Pt denies  . Colon polyps     Past Surgical History  Procedure Laterality Date  . Colonscopy    . Back surgery    . Colonoscopy  01/10/2013    Procedure: COLONOSCOPY;  Surgeon: Rogene Houston, MD;  Location: [facility];  Service: Endoscopy;  Laterality: N/A;  730  . Colonoscopy N/A 08/21/2013    Procedure: COLONOSCOPY;  Surgeon: Rogene Houston, MD;  Location: [facility];  Service: Endoscopy;  Laterality: N/A;   1030    History   Social History  . Marital Status: Married    Spouse Name: N/A  . Number of Children: N/A  . Years of Education: N/A   Occupational History  . 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 Smoking status: Former Smoker -- 3.00 packs/day for 30 years    Types: Cigarettes    Start date: 12/18/1957    Quit date: 12/18/1996  . Smokeless tobacco: Never Used     Comment: started smoking as a child  . Alcohol Use: 0.0 oz/week    0 Standard drinks or equivalent per week     Comment: beer  . Drug Use: No  . Sexual Activity: Not on file   Other Topics Concern  . Not on file   Social History Narrative     Filed Vitals:   07/15/15 0814  BP: 108/72  Pulse: 55  Height: 6' (1.829 m)  Weight: 202 lb (91.627 kg)  SpO2: 92%    PHYSICAL EXAM General: NAD HEENT: Normal. Neck: No JVD, no thyromegaly. Lungs: Clear to auscultation bilaterally with normal respiratory effort. CV: Nondisplaced PMI.  Regular rate and rhythm, normal S1/S2, no S3/S4, no murmur. No pretibial or periankle edema.  No carotid bruit.  Normal pedal pulses.  Abdomen: Soft, nontender, no hepatosplenomegaly, no distention.  Neurologic: Alert and oriented x 3.  Psych: Normal affect. Skin: Normal.  Musculoskeletal: Normal range of motion, no gross deformities. Extremities: No clubbing or cyanosis.   ECG: Most recent ECG reviewed.      ASSESSMENT AND PLAN: 1. Chest pain/pericarditis: Symptomatically stable. Prior episodes likely due to pericarditis. Given previously normal nuclear stress testing and good echocardiographic results, no further testing is indicated.  2. Essential HTN: Well controlled on current therapy. No changes.  3. Hyperlipidemia: Continue simvastatin 40 mg daily.  Dispo: f/u prn.  Kate Sable, M.D., F.A.C.C.

## 2015-07-16 ENCOUNTER — Encounter (INDEPENDENT_AMBULATORY_CARE_PROVIDER_SITE_OTHER): Payer: Self-pay | Admitting: *Deleted

## 2015-07-30 ENCOUNTER — Encounter (INDEPENDENT_AMBULATORY_CARE_PROVIDER_SITE_OTHER): Payer: Self-pay | Admitting: Internal Medicine

## 2015-07-30 ENCOUNTER — Other Ambulatory Visit (INDEPENDENT_AMBULATORY_CARE_PROVIDER_SITE_OTHER): Payer: Self-pay | Admitting: *Deleted

## 2015-07-30 ENCOUNTER — Telehealth (INDEPENDENT_AMBULATORY_CARE_PROVIDER_SITE_OTHER): Payer: Self-pay | Admitting: *Deleted

## 2015-07-30 ENCOUNTER — Ambulatory Visit (INDEPENDENT_AMBULATORY_CARE_PROVIDER_SITE_OTHER): Payer: Medicare Other | Admitting: Internal Medicine

## 2015-07-30 VITALS — BP 94/52 | HR 72 | Temp 97.0°F | Ht 72.0 in | Wt 202.2 lb

## 2015-07-30 DIAGNOSIS — R195 Other fecal abnormalities: Secondary | ICD-10-CM

## 2015-07-30 DIAGNOSIS — D369 Benign neoplasm, unspecified site: Secondary | ICD-10-CM | POA: Diagnosis not present

## 2015-07-30 DIAGNOSIS — Z86010 Personal history of colonic polyps: Secondary | ICD-10-CM

## 2015-07-30 MED ORDER — PEG 3350-KCL-NA BICARB-NACL 420 G PO SOLR: 4000.0000 mL | Freq: Once | ORAL | Status: DC

## 2015-07-30 NOTE — Patient Instructions (Addendum)
Colonoscopy.  The risks and benefits such as perforation, bleeding, and infection were reviewed with the patient and is agreeable. 

## 2015-07-30 NOTE — Telephone Encounter (Signed)
Patient needs trilyte 

## 2015-07-30 NOTE — Progress Notes (Signed)
Subjective:    Patient ID: [name], male    DOB: 05-Feb-1946, 69 y.o.   MRN: [id number]  HPI Referrred to our office by Dr Woody [facility]) for positive stool card. Patient states he has never really paid any attention to his stools. His stools are brown in color.  No change in his stools. There has been no weight loss. No abdominal pain.  Appetite is good.  His last colonoscopy was in 2014. He underwent a screening colonoscopy and had a flat cecal polyp removed by piecemeal polypectomy and APC. It was a tubular adenoma. He had another large tubular removed from the sigmoid colon along with a small adenoma. Returned for a repeat exam to make sure he did not have residual cecal polyp.   06/21/2014 H and H 14.8 and 44.2, platelet ct 219   08/21/2013 Colonoscopy  Indications: Patient is 69 year old Caucasian male who underwent screening colonoscopy in general the 2014 and had flat cecal polyp removed by piecemeal polypectomy and APC. It was tubular adenoma. Patient had another large tubular adenoma removed from sigmoid colon along with smaller adenoma. He is returning for repeat examination to make sure he does not have residual cecal polyp.  Impression:  Examination performed to cecum. Small residual polyp at cecum ablated via cold biopsy. This polyp was lost. three small polyps ablated via cold biopsy and submitted together(hepatic and splenic flexure and transverse colon). Mild sigmoid colon diverticulosis. External hemorrhoids.  Biopsy results reviewed with patient. He had 3 small polyps removed and they're tubular adenomas. Polyp from cecum was felt to be residual polyp was lost. Next colonoscopy in 3 years. Report to PCP   Review of Systems Past Medical History  Diagnosis Date  . Hypertension   . Cellulitis and abscess of other specified site   . Diabetes mellitus     Pt denies  . Colon polyps     Past Surgical History  Procedure Laterality Date  .  Colonscopy    . Back surgery    . Colonoscopy  01/10/2013    Procedure: COLONOSCOPY;  Surgeon: Rogene Houston, MD;  [facility]: [facility];  Service: Endoscopy;  Laterality: N/A;  730  . Colonoscopy N/A 08/21/2013    Procedure: COLONOSCOPY;  Surgeon: Rogene Houston, MD;  [facility]: [facility];  Service: Endoscopy;  Laterality: N/A;  1030    No Known Allergies  Current Outpatient Prescriptions on File Prior to Visit  Medication Sig Dispense Refill  . aspirin 81 MG tablet Take 81 mg by mouth daily.    Marland Kitchen EPIPEN 2-PAK 0.3 MG/0.3ML SOAJ injection   3  . gabapentin (NEURONTIN) 300 MG capsule Take 300 mg by mouth 3 (three) times daily.     Marland Kitchen HYDROcodone-acetaminophen (NORCO/VICODIN) 5-325 MG per tablet Take 1 tablet by mouth as needed.    Marland Kitchen lisinopril-hydrochlorothiazide (PRINZIDE,ZESTORETIC) 10-12.5 MG per tablet Take 1 tablet by mouth daily. 90 tablet 3  . metoprolol succinate (TOPROL-XL) 25 MG 24 hr tablet Take 1 tablet (25 mg total) by mouth daily. 90 tablet 3  . nabumetone (RELAFEN) 500 MG tablet Take 500 mg by mouth 2 (two) times daily.     . simvastatin (ZOCOR) 40 MG tablet Take 1 tablet (40 mg total) by mouth daily. 90 tablet 3   No current facility-administered medications on file prior to visit.        Objective:   Physical Exam Blood pressure 94/52, pulse 72, temperature 97 F (36.1 C), height 6' (  1.829 m), weight 202 lb 3.2 oz (91.717 kg).  Alert and oriented. Skin warm and dry. Oral mucosa is moist.   . Sclera anicteric, conjunctivae is pink. Thyroid not enlarged. No cervical lymphadenopathy. Lungs clear. Heart regular rate and rhythm.  Abdomen is soft. Bowel sounds are positive. No hepatomegaly. No abdominal masses felt. No tenderness.  No edema to lower extremities.         Assessment & Plan:  Guaiac positive stool. Hx of large tubular adenoma.  Colonic neoplasm needs to be ruled out. I discussed with Dr. Laural Golden.  The risks and benefits such as perforation, bleeding,  and infection were reviewed with the patient and is agreeable.

## 2015-08-06 ENCOUNTER — Encounter (INDEPENDENT_AMBULATORY_CARE_PROVIDER_SITE_OTHER): Payer: Self-pay

## 2015-08-26 ENCOUNTER — Ambulatory Visit (HOSPITAL_COMMUNITY)
Admission: RE | Admit: 2015-08-26 | Discharge: 2015-08-26 | Disposition: A | Discharge status: Home / Self Care | Payer: Medicare Other | Source: Ambulatory Visit | Attending: Internal Medicine | Admitting: Internal Medicine

## 2015-08-26 ENCOUNTER — Encounter (HOSPITAL_COMMUNITY): Payer: Self-pay

## 2015-08-26 ENCOUNTER — Encounter (HOSPITAL_COMMUNITY)
Admission: RE | Disposition: A | Discharge status: Home / Self Care | Payer: Self-pay | Source: Ambulatory Visit | Attending: Internal Medicine

## 2015-08-26 DIAGNOSIS — K5730 Diverticulosis of large intestine without perforation or abscess without bleeding: Secondary | ICD-10-CM

## 2015-08-26 DIAGNOSIS — I10 Essential (primary) hypertension: Secondary | ICD-10-CM | POA: Diagnosis not present

## 2015-08-26 DIAGNOSIS — R195 Other fecal abnormalities: Secondary | ICD-10-CM | POA: Insufficient documentation

## 2015-08-26 DIAGNOSIS — Z79899 Other long term (current) drug therapy: Secondary | ICD-10-CM | POA: Insufficient documentation

## 2015-08-26 DIAGNOSIS — Z7982 Long term (current) use of aspirin: Secondary | ICD-10-CM | POA: Diagnosis not present

## 2015-08-26 DIAGNOSIS — Z86010 Personal history of colonic polyps: Secondary | ICD-10-CM

## 2015-08-26 DIAGNOSIS — D120 Benign neoplasm of cecum: Secondary | ICD-10-CM | POA: Insufficient documentation

## 2015-08-26 DIAGNOSIS — Z87891 Personal history of nicotine dependence: Secondary | ICD-10-CM | POA: Insufficient documentation

## 2015-08-26 DIAGNOSIS — K644 Residual hemorrhoidal skin tags: Secondary | ICD-10-CM | POA: Diagnosis not present

## 2015-08-26 DIAGNOSIS — K621 Rectal polyp: Secondary | ICD-10-CM

## 2015-08-26 DIAGNOSIS — D127 Benign neoplasm of rectosigmoid junction: Secondary | ICD-10-CM | POA: Diagnosis not present

## 2015-08-26 DIAGNOSIS — K648 Other hemorrhoids: Secondary | ICD-10-CM

## 2015-08-26 HISTORY — PX: COLONOSCOPY: SHX5424

## 2015-08-26 SURGERY — COLONOSCOPY
Anesthesia: Moderate Sedation

## 2015-08-26 MED ORDER — MIDAZOLAM HCL 5 MG/5ML IJ SOLN
INTRAMUSCULAR | Status: DC | PRN
  Administered 2015-08-26 (×2): 2 mg via INTRAVENOUS
  Administered 2015-08-26: 1 mg via INTRAVENOUS
  Administered 2015-08-26: 2 mg via INTRAVENOUS

## 2015-08-26 MED ORDER — MEPERIDINE HCL 50 MG/ML IJ SOLN
INTRAMUSCULAR | Status: DC | PRN
  Administered 2015-08-26 (×2): 25 mg

## 2015-08-26 MED ORDER — SIMETHICONE 40 MG/0.6ML PO SUSP
ORAL | Status: DC | PRN
  Administered 2015-08-26: 11:00:00

## 2015-08-26 MED ORDER — MIDAZOLAM HCL 5 MG/5ML IJ SOLN
INTRAMUSCULAR | Status: AC
  Filled 2015-08-26: qty 10

## 2015-08-26 MED ORDER — MEPERIDINE HCL 50 MG/ML IJ SOLN
INTRAMUSCULAR | Status: AC
  Filled 2015-08-26: qty 1

## 2015-08-26 MED ORDER — SODIUM CHLORIDE 0.9 % IV SOLN
INTRAVENOUS | Status: DC
  Administered 2015-08-26: 10:00:00 via INTRAVENOUS

## 2015-08-26 NOTE — Op Note (Signed)
COLONOSCOPY PROCEDURE REPORT  PATIENT:  [name]  MR#:  [id number] Birthdate:  Jul 01, 1946, 69 y.o., male Endoscopist:  Dr. Rogene Houston, MD Referred By:  Dr. Glenda Chroman, MD Procedure Date: 08/26/2015  Procedure:   Colonoscopy  Indications:  Patient is 69 year old Caucasian male was history of multiple colonic adenomas was recently noted to have heme-positive stool. His hemoglobin is 14.8 g. He is undergoing diagnostic colonoscopy.  Informed Consent:  The procedure and risks were reviewed with the patient and informed consent was obtained.  Medications:  Demerol 50 mg IV Versed 7 mg IV  Description of procedure:  After a digital rectal exam was performed, that colonoscope was advanced from the anus through the rectum and colon to the area of the cecum, ileocecal valve and appendiceal orifice. The cecum was deeply intubated. These structures were well-seen and photographed for the record. From the level of the cecum and ileocecal valve, the scope was slowly and cautiously withdrawn. The mucosal surfaces were carefully surveyed utilizing scope tip to flexion to facilitate fold flattening as needed. The scope was pulled down into the rectum where a thorough exam including retroflexion was performed.  Findings:   Prep satisfactory. 5 mm cecal polyp across some ileocecal valve was hot snared. Another small cecal polyp was cold snared. Both of these cecal polyps were submitted together. No residual polyp at cecum. Two small diverticula at sigmoid colon Two small rectal polyps were cold snared and submitted together. Small hemorrhoids below the dentate line.   Therapeutic/Diagnostic Maneuvers Performed:  See above  Complications:  none  EBL: None Cecal Withdrawal Time:  20 minutes  Impression:  Examination performed to cecum. 5 mm cecal polyp hot snared and submitted along with another small cecal polyp which was cold snared. Two small rectal polyps were cold snared and  submitted together. Two small diverticula at sigmoid colon. External hemorrhoids.  Recommendations:  Standard instructions given. No Aspirin or NSAIDs for 1 week.. I will contact patient with biopsy results and further recommendations.  [name] U  08/26/2015 11:56 AM  CC: Dr. Glenda Chroman., MD & Dr. Rayne Du ref. provider found

## 2015-08-26 NOTE — Discharge Instructions (Signed)
No aspirin or NSAIDs for 1 week. °Resume other medications as before. °Resume usual diet. °No driving for 24 hours. °Physician will call with biopsy results. ° ° ° °Colonoscopy, Care After °These instructions give you information on caring for yourself after your procedure. Your doctor may also give you more specific instructions. Call your doctor if you have any problems or questions after your procedure. °HOME CARE °· Do not drive for 24 hours. °· Do not sign important papers or use machinery for 24 hours. °· You may shower. °· You may go back to your usual activities, but go slower for the first 24 hours. °· Take rest breaks often during the first 24 hours. °· Walk around or use warm packs on your belly (abdomen) if you have belly cramping or gas. °· Drink enough fluids to keep your pee (urine) clear or pale yellow. °· Resume your normal diet. Avoid heavy or fried foods. °· Avoid drinking alcohol for 24 hours or as told by your doctor. °· Only take medicines as told by your doctor. °If a tissue sample (biopsy) was taken during the procedure:  °· Do not take aspirin or blood thinners for 7 days, or as told by your doctor. °· Do not drink alcohol for 7 days, or as told by your doctor. °· Eat soft foods for the first 24 hours. °GET HELP IF: °You still have a small amount of blood in your poop (stool) 2-3 days after the procedure. °GET HELP RIGHT AWAY IF: °· You have more than a small amount of blood in your poop. °· You see clumps of tissue (blood clots) in your poop. °· Your belly is puffy (swollen). °· You feel sick to your stomach (nauseous) or throw up (vomit). °· You have a fever. °· You have belly pain that gets worse and medicine does not help. °MAKE SURE YOU: °· Understand these instructions. °· Will watch your condition. °· Will get help right away if you are not doing well or get worse. °Document Released: 01/06/2011 Document Revised: 12/09/2013 Document Reviewed: 08/11/2013 °Exit[employer]® Patient Information  ©2015 [employer]. This information is not intended to replace advice given to you by your health care provider. Make sure you discuss any questions you have with your health care provider. ° ° °Colon Polyps °Polyps are lumps of extra tissue growing inside the body. Polyps can grow in the large intestine (colon). Most colon polyps are noncancerous (benign). However, some colon polyps can become cancerous over time. Polyps that are larger than a pea may be harmful. To be safe, caregivers remove and test all polyps. °CAUSES  °Polyps form when mutations in the genes cause your cells to grow and divide even though no more tissue is needed. °RISK FACTORS °There are a number of risk factors that can increase your chances of getting colon polyps. They include: °· Being older than 50 years. °· Family history of colon polyps or colon cancer. °· Long-term colon diseases, such as colitis or Crohn disease. °· Being overweight. °· Smoking. °· Being inactive. °· Drinking too much alcohol. °SYMPTOMS  °Most small polyps do not cause symptoms. If symptoms are present, they may include: °· Blood in the stool. The stool may look dark red or black. °· Constipation or diarrhea that lasts longer than 1 week. °DIAGNOSIS °People often do not know they have polyps until their caregiver finds them during a regular checkup. Your caregiver can use 4 tests to check for polyps: °· Digital rectal exam. The caregiver wears   gloves and feels inside the rectum. This test would find polyps only in the rectum. °· Barium enema. The caregiver puts a liquid called barium into your rectum before taking X-rays of your colon. Barium makes your colon look white. Polyps are dark, so they are easy to see in the X-ray pictures. °· Sigmoidoscopy. A thin, flexible tube (sigmoidoscope) is placed into your rectum. The sigmoidoscope has a light and tiny camera in it. The caregiver uses the sigmoidoscope to look at the last third of your colon. °· Colonoscopy. This  test is like sigmoidoscopy, but the caregiver looks at the entire colon. This is the most common method for finding and removing polyps. °TREATMENT  °Any polyps will be removed during a sigmoidoscopy or colonoscopy. The polyps are then tested for cancer. °PREVENTION  °To help lower your risk of getting more colon polyps: °· Eat plenty of fruits and vegetables. Avoid eating fatty foods. °· Do not smoke. °· Avoid drinking alcohol. °· Exercise every day. °· Lose weight if recommended by your caregiver. °· Eat plenty of calcium and folate. Foods that are rich in calcium include milk, cheese, and broccoli. Foods that are rich in folate include chickpeas, kidney beans, and spinach. °HOME CARE INSTRUCTIONS °Keep all follow-up appointments as directed by your caregiver. You may need periodic exams to check for polyps. °SEEK MEDICAL CARE IF: °You notice bleeding during a bowel movement. °Document Released: 08/30/2004 Document Revised: 02/26/2012 Document Reviewed: 02/13/2012 °ExitCare® Patient Information ©2015 [employer]. This information is not intended to replace advice given to you by your health care provider. Make sure you discuss any questions you have with your health care provider. ° °

## 2015-08-26 NOTE — H&P (Addendum)
[name] is an 69 y.o. male.   Chief Complaint:  Patient is here for colonoscopy. HPI: patient is 69 year old Caucasian male was history of colonic adenomas and is here for surveillance colonoscopy. He had 2 colonoscopies in 2014 with removal of multiple adenomas including large sessile cecal polyp.he was noted to have heme-positive stool by PCP. He denies abdominal pain change in bowel habits or rectal bleeding. Family history is negative for CRC.  Past Medical History  Diagnosis Date  . Hypertension   . Cellulitis and abscess of other specified site   . Colon polyps     Past Surgical History  Procedure Laterality Date  . Colonscopy    . Back surgery    . Colonoscopy  01/10/2013    Procedure: COLONOSCOPY;  Surgeon: Rogene Houston, MD;  [facility]: [facility];  Service: Endoscopy;  Laterality: N/A;  730  . Colonoscopy N/A 08/21/2013    Procedure: COLONOSCOPY;  Surgeon: Rogene Houston, MD;  Location: [facility];  Service: Endoscopy;  Laterality: N/A;  1030    Family History  Problem Relation Age of Onset  . Cancer Other     Family Hx of Cancer, CAD,Diabetes,Kidney Failure  . Colon cancer Neg Hx    Social History:  reports that he quit smoking about 18 years ago. His smoking use included Cigarettes. He started smoking about 57 years ago. He has a 90 pack-year smoking history. He has never used smokeless tobacco. He reports that he drinks alcohol. He reports that he does not use illicit drugs.  Allergies:  Allergies  Allergen Reactions  . Bee Venom Anaphylaxis    Medications Prior to Admission  Medication Sig Dispense Refill  . aspirin 81 MG tablet Take 81 mg by mouth daily.    Marland Kitchen gabapentin (NEURONTIN) 300 MG capsule Take 300 mg by mouth 3 (three) times daily.     Marland Kitchen HYDROcodone-acetaminophen (NORCO/VICODIN) 5-325 MG per tablet Take 1 tablet by mouth as needed.    Marland Kitchen lisinopril-hydrochlorothiazide (PRINZIDE,ZESTORETIC) 10-12.5 MG per tablet Take 1 tablet by mouth daily.  90 tablet 3  . metoprolol succinate (TOPROL-XL) 25 MG 24 hr tablet Take 1 tablet (25 mg total) by mouth daily. 90 tablet 3  . nabumetone (RELAFEN) 500 MG tablet Take 500 mg by mouth 2 (two) times daily.     . polyethylene glycol-electrolytes (NULYTELY/GOLYTELY) 420 G solution Take 4,000 mLs by mouth once. 4000 mL 0  . simvastatin (ZOCOR) 40 MG tablet Take 1 tablet (40 mg total) by mouth daily. 90 tablet 3  . EPIPEN 2-PAK 0.3 MG/0.3ML SOAJ injection   3    No results found for this or any previous visit (from the past 48 hour(s)). No results found.  ROS  Blood pressure 107/64, pulse 57, temperature 98.8 F (37.1 C), temperature source Oral, resp. rate 16, height 6' (1.829 m), weight 205 lb (92.987 kg), SpO2 98 %. Physical Exam  Constitutional: He appears well-developed and well-nourished.  HENT:  Mouth/Throat: Oropharynx is clear and moist.  Eyes: Conjunctivae are normal. No scleral icterus.  Neck: No thyromegaly present.  Cardiovascular: Normal rate, regular rhythm and normal heart sounds.   No murmur heard. Respiratory: Effort normal and breath sounds normal.  GI: Soft. He exhibits no distension and no mass. There is no tenderness.  Musculoskeletal: He exhibits no edema.  Lymphadenopathy:    He has no cervical adenopathy.  Neurological: He is alert.  Skin: Skin is warm and dry.     Assessment/Plan History of  multiple colonic adenomas. Heme-positive stool. Diagnostic colonoscopy.  [name] 08/26/2015, 11:12 AM

## 2015-08-31 ENCOUNTER — Encounter (HOSPITAL_COMMUNITY): Payer: Self-pay | Admitting: Internal Medicine

## 2015-11-14 IMAGING — CR DG CHEST 1V PORT
1 series · 1 of 1 positions shown · non-contrast
Comparison: None.

CLINICAL DATA: Chest pain.

EXAM:
PORTABLE CHEST - 1 VIEW

[portable]
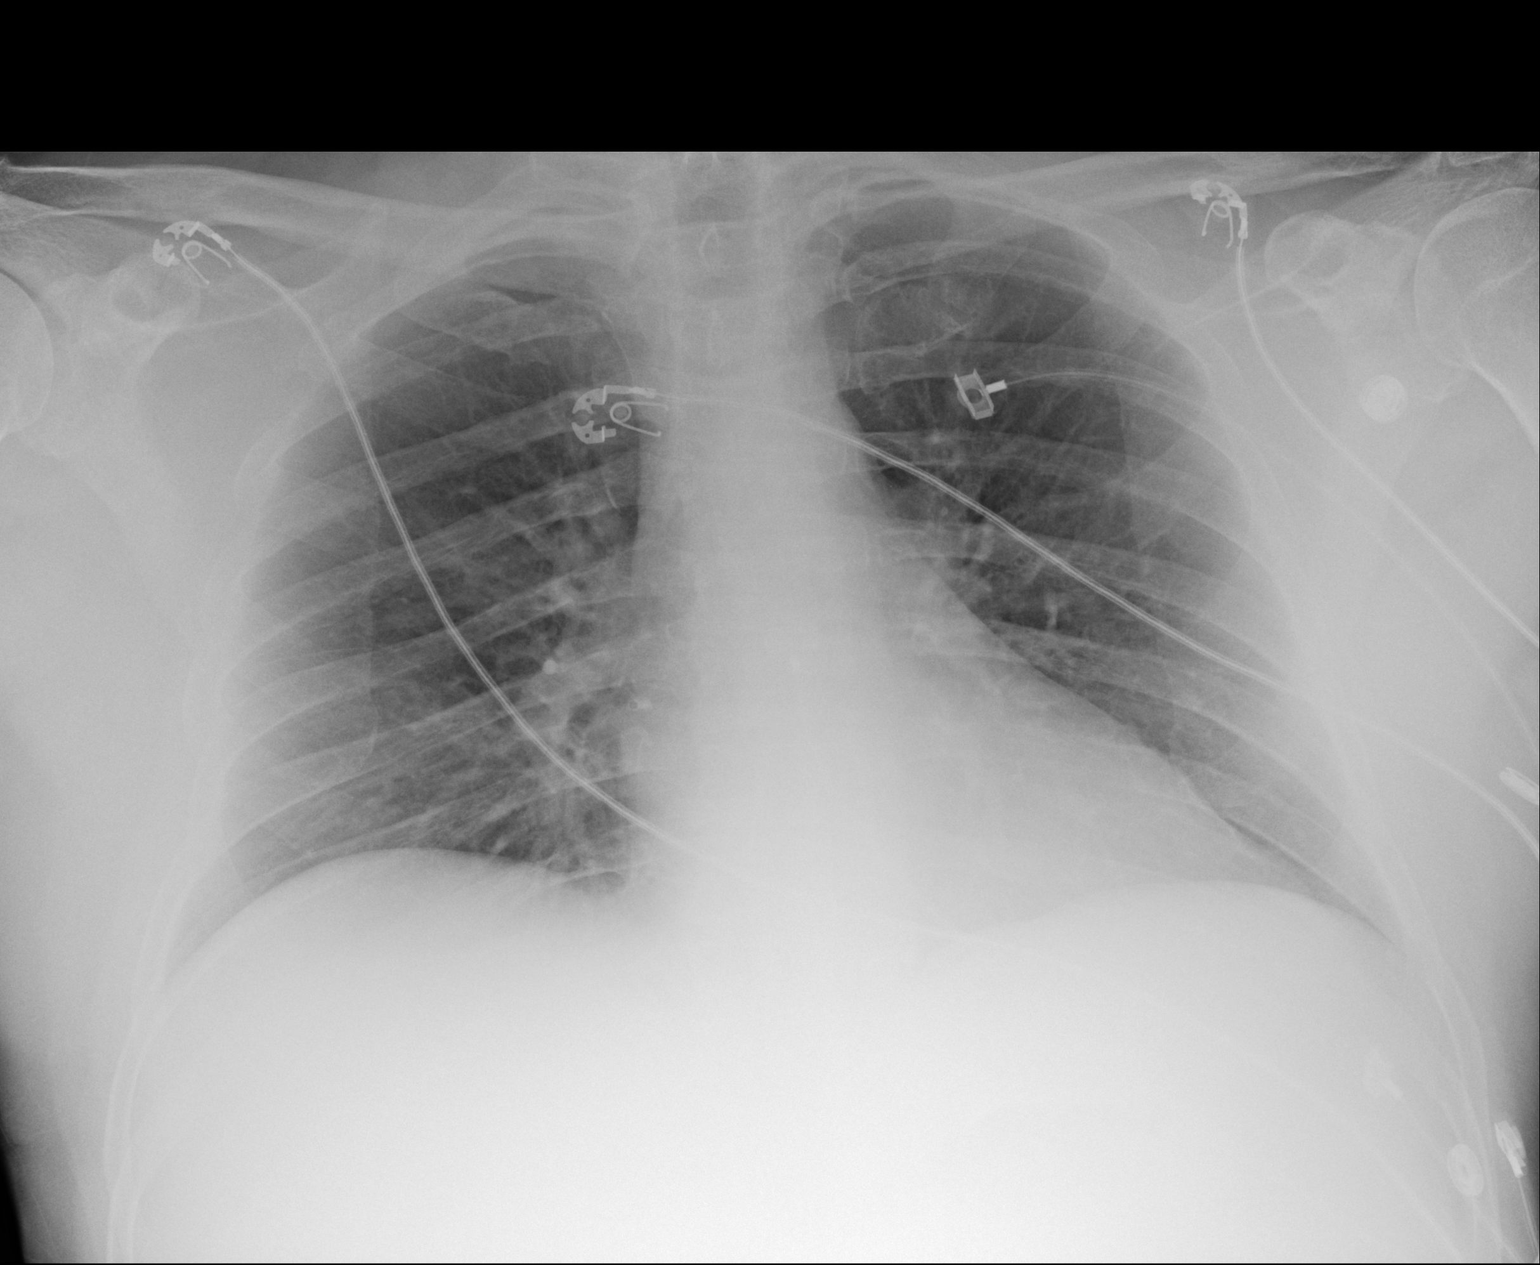

[1 of 1 positions shown; findings below may reference images not displayed]

FINDINGS: The cardiac silhouette, mediastinal and hilar contours are normal.
Lungs are clear. No pleural effusion. The bony thorax is intact.
IMPRESSION: No acute cardiopulmonary findings.

## 2015-11-25 IMAGING — NM NM MYOCAR MULTI W/SPECT W/WALL MOTION & EF
2 series · 12 of 12 positions shown · non-contrast
Comparison: None.

CLINICAL DATA: 67-year-old male with no known history of coronary
artery disease referred for chest pain and abnormal EKG.

EXAM:
MYOCARDIAL IMAGING WITH SPECT (REST AND EXERCISE)
GATED LEFT VENTRICULAR WALL MOTION STUDY
LEFT VENTRICULAR EJECTION FRACTION
TECHNIQUE: Standard myocardial SPECT imaging was performed after resting
intravenous injection of 10 mCi Fc-JJm sestamibi. Subsequently,
exercise tolerance test was performed by the patient under the
supervision of the Cardiology staff. At peak-stress, 30 mCi Fc-JJm
sestamibi was injected intravenously and standard myocardial SPECT
imaging was performed. Quantitative gated imaging was also performed
to evaluate left ventricular wall motion, and estimate left
ventricular ejection fraction.

[Series 1: rest · 8.28mm/px · 6 of 64 frames shown]
[frame 6/64]
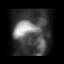
[frame 16/64]
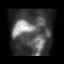
[frame 27/64]
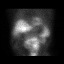
[frame 38/64]
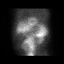
[frame 48/64]
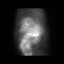
[frame 59/64]
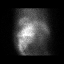

[Series 3: stress gated - perfusion · 8.28mm/px · 6 of 64 frames shown]
[frame 6/64]
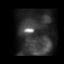
[frame 16/64]
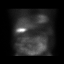
[frame 27/64]
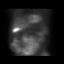
[frame 38/64]
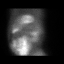
[frame 48/64]
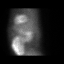
[frame 59/64]
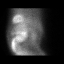

[12 of 12 positions shown; findings below may reference images not displayed]

FINDINGS: Exercise stress

Baseline EKG showed normal sinus rhythm with diffuse early report is
a shin abnormalities. The patient exercised according to Bruce
protocol for 7 min and 13 seconds, achieving a work level of
[place]. The resting heart rate of 61 beats per min rose to a maximal
heart rate of 153 beats per min, representing 98% of the maximal age
predicted target heart rate. The resting blood pressure 97/67
increased to a maximum pressure of 167/8. The test was stopped due
to shortness of breath, the patient did not experience any chest
pain. Exercise EKG showed borderlinr ischemic changes in the
inferior limb leads and lateral precordial leads, there were no
significant arrhythmias.

Perfusion: No decreased activity in the left ventricle on stress
imaging to suggest reversible ischemia or infarction.

Wall Motion: Normal left ventricular wall motion. No left
ventricular dilation.

Left Ventricular Ejection Fraction: 68 %

End diastolic volume 94 ml

End systolic volume 31 ml
IMPRESSION: 1. No reversible ischemia or infarction.

2. Normal left ventricular wall motion.

3. Left ventricular ejection fraction 68%

4.  Low risk stress test findings*.

*8178 Appropriate Use Criteria for Coronary Revascularization
Focused Update: J Am Coll Cardiol. 8178;59(9):857-881.
[URL]

## 2016-02-01 DIAGNOSIS — M5136 Other intervertebral disc degeneration, lumbar region: Secondary | ICD-10-CM | POA: Diagnosis not present

## 2016-02-01 DIAGNOSIS — M545 Low back pain: Secondary | ICD-10-CM | POA: Diagnosis not present

## 2016-02-01 DIAGNOSIS — M4726 Other spondylosis with radiculopathy, lumbar region: Secondary | ICD-10-CM | POA: Diagnosis not present

## 2016-02-01 DIAGNOSIS — M4156 Other secondary scoliosis, lumbar region: Secondary | ICD-10-CM | POA: Diagnosis not present

## 2016-02-01 DIAGNOSIS — M5416 Radiculopathy, lumbar region: Secondary | ICD-10-CM | POA: Diagnosis not present

## 2016-02-01 DIAGNOSIS — M4806 Spinal stenosis, lumbar region: Secondary | ICD-10-CM | POA: Diagnosis not present

## 2016-02-01 DIAGNOSIS — Z6827 Body mass index (BMI) 27.0-27.9, adult: Secondary | ICD-10-CM | POA: Diagnosis not present

## 2016-06-26 DIAGNOSIS — Z125 Encounter for screening for malignant neoplasm of prostate: Secondary | ICD-10-CM | POA: Diagnosis not present

## 2016-06-26 DIAGNOSIS — Z79899 Other long term (current) drug therapy: Secondary | ICD-10-CM | POA: Diagnosis not present

## 2016-06-26 DIAGNOSIS — E7800 Pure hypercholesterolemia, unspecified: Secondary | ICD-10-CM | POA: Diagnosis not present

## 2016-06-26 DIAGNOSIS — Z7189 Other specified counseling: Secondary | ICD-10-CM | POA: Diagnosis not present

## 2016-06-26 DIAGNOSIS — Z1389 Encounter for screening for other disorder: Secondary | ICD-10-CM | POA: Diagnosis not present

## 2016-06-26 DIAGNOSIS — Z0000 Encounter for general adult medical examination without abnormal findings: Secondary | ICD-10-CM | POA: Diagnosis not present

## 2016-06-26 DIAGNOSIS — Z299 Encounter for prophylactic measures, unspecified: Secondary | ICD-10-CM | POA: Diagnosis not present

## 2016-06-26 DIAGNOSIS — Z6828 Body mass index (BMI) 28.0-28.9, adult: Secondary | ICD-10-CM | POA: Diagnosis not present

## 2016-06-26 DIAGNOSIS — R5383 Other fatigue: Secondary | ICD-10-CM | POA: Diagnosis not present

## 2016-08-01 DIAGNOSIS — M5416 Radiculopathy, lumbar region: Secondary | ICD-10-CM | POA: Diagnosis not present

## 2016-08-01 DIAGNOSIS — M545 Low back pain: Secondary | ICD-10-CM | POA: Diagnosis not present

## 2016-08-01 DIAGNOSIS — M4726 Other spondylosis with radiculopathy, lumbar region: Secondary | ICD-10-CM | POA: Diagnosis not present

## 2016-08-01 DIAGNOSIS — M4156 Other secondary scoliosis, lumbar region: Secondary | ICD-10-CM | POA: Diagnosis not present

## 2016-08-01 DIAGNOSIS — M5136 Other intervertebral disc degeneration, lumbar region: Secondary | ICD-10-CM | POA: Diagnosis not present

## 2016-08-01 DIAGNOSIS — Z6827 Body mass index (BMI) 27.0-27.9, adult: Secondary | ICD-10-CM | POA: Diagnosis not present

## 2016-08-17 ENCOUNTER — Other Ambulatory Visit: Payer: Self-pay | Admitting: Cardiovascular Disease

## 2016-08-17 MED ORDER — SIMVASTATIN 40 MG PO TABS: 40.0000 mg | ORAL_TABLET | Freq: Every day | ORAL | 0 refills | Status: DC

## 2016-08-17 MED ORDER — LISINOPRIL-HYDROCHLOROTHIAZIDE 10-12.5 MG PO TABS: 1.0000 | ORAL_TABLET | Freq: Every day | ORAL | 0 refills | Status: DC

## 2016-08-17 MED ORDER — METOPROLOL SUCCINATE ER 25 MG PO TB24: 25.0000 mg | ORAL_TABLET | Freq: Every day | ORAL | 0 refills | Status: DC

## 2016-08-17 NOTE — Telephone Encounter (Signed)
Medication sent to pharmacy  

## 2016-08-17 NOTE — Telephone Encounter (Signed)
Refill:  Patient called stating that he needs refills on the following: Simvastatin, Metoprolol, Lisinopril He has upcoming appointment in October.

## 2016-08-28 DIAGNOSIS — M7732 Calcaneal spur, left foot: Secondary | ICD-10-CM | POA: Diagnosis not present

## 2016-08-28 DIAGNOSIS — Z6828 Body mass index (BMI) 28.0-28.9, adult: Secondary | ICD-10-CM | POA: Diagnosis not present

## 2016-08-28 DIAGNOSIS — M79672 Pain in left foot: Secondary | ICD-10-CM | POA: Diagnosis not present

## 2016-08-28 DIAGNOSIS — M109 Gout, unspecified: Secondary | ICD-10-CM | POA: Diagnosis not present

## 2016-08-28 DIAGNOSIS — I10 Essential (primary) hypertension: Secondary | ICD-10-CM | POA: Diagnosis not present

## 2016-09-19 ENCOUNTER — Encounter: Payer: Self-pay | Admitting: *Deleted

## 2016-09-20 ENCOUNTER — Ambulatory Visit (INDEPENDENT_AMBULATORY_CARE_PROVIDER_SITE_OTHER): Payer: Medicare Other | Admitting: Cardiovascular Disease

## 2016-09-20 ENCOUNTER — Encounter: Payer: Self-pay | Admitting: Cardiovascular Disease

## 2016-09-20 VITALS — BP 99/61 | HR 63 | Ht 72.0 in | Wt 207.0 lb

## 2016-09-20 DIAGNOSIS — I10 Essential (primary) hypertension: Secondary | ICD-10-CM

## 2016-09-20 DIAGNOSIS — I32 Pericarditis in diseases classified elsewhere: Secondary | ICD-10-CM

## 2016-09-20 DIAGNOSIS — E7800 Pure hypercholesterolemia, unspecified: Secondary | ICD-10-CM

## 2016-09-20 MED ORDER — LISINOPRIL-HYDROCHLOROTHIAZIDE 10-12.5 MG PO TABS: 1.0000 | ORAL_TABLET | Freq: Every day | ORAL | 0 refills | Status: DC

## 2016-09-20 MED ORDER — SIMVASTATIN 40 MG PO TABS: 40.0000 mg | ORAL_TABLET | Freq: Every day | ORAL | 0 refills | Status: DC

## 2016-09-20 MED ORDER — METOPROLOL SUCCINATE ER 25 MG PO TB24: 25.0000 mg | ORAL_TABLET | Freq: Every day | ORAL | 0 refills | Status: DC

## 2016-09-20 NOTE — Addendum Note (Signed)
Addended by: Laurine Blazer on: 09/20/2016 09:04 AM   Modules accepted: Orders

## 2016-09-20 NOTE — Patient Instructions (Signed)
Medication Instructions:  Continue all current medications.  Labwork: none  Testing/Procedures: none  Follow-Up: As needed.    Any Other Special Instructions Will Be Listed Below (If Applicable).  If you need a refill on your cardiac medications before your next appointment, please call your pharmacy.  

## 2016-09-20 NOTE — Progress Notes (Signed)
SUBJECTIVE: The patient presents for routine follow-up. He has a prior history of pericarditis with resulting chest pain. He previously underwent a normal nuclear stress test and has normal left ventricular systolic function. He also has hypertension and hyperlipidemia. He was previously told he could follow-up as needed.  The patient denies any symptoms of chest pain, palpitations, shortness of breath, lightheadedness, dizziness, leg swelling, orthopnea, PND, and syncope.  ECG performed in the office today which I personally reviewed demonstrates normal sinus bradycardia with no ischemic ST segment or T-wave abnormalities, nor any arrhythmias.   Review of Systems: As per "subjective", otherwise negative.  Allergies  Allergen Reactions  . Bee Venom Anaphylaxis    Current Outpatient Prescriptions  Medication Sig Dispense Refill  . aspirin EC 81 MG tablet Take 81 mg by mouth daily.    Marland Kitchen COLCRYS 0.6 MG tablet Take 0.6 mg by mouth daily as needed.  1  . diclofenac (VOLTAREN) 75 MG EC tablet Take 1 tablet by mouth 2 (two) times daily as needed.    Marland Kitchen EPIPEN 2-PAK 0.3 MG/0.3ML SOAJ injection   3  . gabapentin (NEURONTIN) 300 MG capsule Take 300 mg by mouth 3 (three) times daily.     Marland Kitchen HYDROcodone-acetaminophen (NORCO/VICODIN) 5-325 MG per tablet Take 1 tablet by mouth as needed.    Marland Kitchen lisinopril-hydrochlorothiazide (PRINZIDE,ZESTORETIC) 10-12.5 MG tablet Take 1 tablet by mouth daily. 90 tablet 0  . metoprolol succinate (TOPROL-XL) 25 MG 24 hr tablet Take 1 tablet (25 mg total) by mouth daily. 90 tablet 0  . nabumetone (RELAFEN) 500 MG tablet Take 500 mg by mouth daily.     . simvastatin (ZOCOR) 40 MG tablet Take 1 tablet (40 mg total) by mouth daily. 90 tablet 0   No current facility-administered medications for this visit.     Past Medical History:  Diagnosis Date  . Cellulitis and abscess of other specified site   . Colon polyps   . Hypertension     Past Surgical History:    Procedure Laterality Date  . BACK SURGERY    . COLONOSCOPY  01/10/2013   Procedure: COLONOSCOPY;  Surgeon: Rogene Houston, MD;  Location: [facility];  Service: Endoscopy;  Laterality: N/A;  730  . COLONOSCOPY N/A 08/21/2013   Procedure: COLONOSCOPY;  Surgeon: Rogene Houston, MD;  Location: [facility];  Service: Endoscopy;  Laterality: N/A;  1030  . COLONOSCOPY N/A 08/26/2015   Procedure: COLONOSCOPY;  Surgeon: Rogene Houston, MD;  Location: [facility];  Service: Endoscopy;  Laterality: N/A;  240  . colonscopy      Social History   Social History  . Marital status: Married    Spouse name: N/A  . Number of children: N/A  . Years of education: N/A   Occupational History  . 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 Smoking status: Former Smoker    Packs/day: 3.00    Years: 30.00    Types: Cigarettes    Start date: 12/18/1957    Quit date: 12/18/1996  . Smokeless tobacco: Never Used     Comment: started smoking as a child  . Alcohol use 0.0 oz/week     Comment: beer  . Drug use: No  . Sexual activity: Not on file   Other Topics Concern  . Not on file   Social History Narrative  . No narrative on file     Vitals:   09/20/16 0837  BP: 99/61  Pulse: 63  Weight: 207 lb (93.9 kg)  Height: 6' (1.829 m)    PHYSICAL EXAM General: NAD HEENT: Normal. Neck: No JVD, no thyromegaly. Lungs: Clear to auscultation bilaterally with normal respiratory effort. CV: Nondisplaced PMI.  Regular rate and rhythm, normal S1/S2, no S3/S4, no murmur. No pretibial or periankle edema.  No carotid bruit.   Abdomen: Soft, nontender, no distention.  Neurologic: Alert and oriented.  Psych: Normal affect. Skin: Normal. Musculoskeletal: No gross deformities.    ECG: Most recent ECG reviewed.      ASSESSMENT AND PLAN: 1. Chest pain/pericarditis: Symptomatically stable. Prior episodes likely due to pericarditis. Given previously normal nuclear stress testing and good  echocardiographic results, no further testing is indicated.  2. Essential HTN: Well controlled on current therapy. No changes.  3. Hyperlipidemia: Continue simvastatin 40 mg daily.  Dispo: f/u prn.   Kate Sable, M.D., F.A.C.C.

## 2016-09-27 DIAGNOSIS — M25472 Effusion, left ankle: Secondary | ICD-10-CM | POA: Diagnosis not present

## 2016-09-27 DIAGNOSIS — M76822 Posterior tibial tendinitis, left leg: Secondary | ICD-10-CM | POA: Diagnosis not present

## 2016-10-10 DIAGNOSIS — M109 Gout, unspecified: Secondary | ICD-10-CM | POA: Diagnosis not present

## 2016-10-10 DIAGNOSIS — E7800 Pure hypercholesterolemia, unspecified: Secondary | ICD-10-CM | POA: Diagnosis not present

## 2016-10-10 DIAGNOSIS — Z299 Encounter for prophylactic measures, unspecified: Secondary | ICD-10-CM | POA: Diagnosis not present

## 2016-10-10 DIAGNOSIS — M76822 Posterior tibial tendinitis, left leg: Secondary | ICD-10-CM | POA: Diagnosis not present

## 2016-11-01 DIAGNOSIS — M67911 Unspecified disorder of synovium and tendon, right shoulder: Secondary | ICD-10-CM | POA: Diagnosis not present

## 2016-11-06 DIAGNOSIS — M76822 Posterior tibial tendinitis, left leg: Secondary | ICD-10-CM | POA: Diagnosis not present

## 2016-11-06 DIAGNOSIS — M25472 Effusion, left ankle: Secondary | ICD-10-CM | POA: Diagnosis not present

## 2016-11-07 DIAGNOSIS — M25511 Pain in right shoulder: Secondary | ICD-10-CM | POA: Diagnosis not present

## 2016-11-07 DIAGNOSIS — M7541 Impingement syndrome of right shoulder: Secondary | ICD-10-CM | POA: Diagnosis not present

## 2016-11-08 DIAGNOSIS — Z6829 Body mass index (BMI) 29.0-29.9, adult: Secondary | ICD-10-CM | POA: Diagnosis not present

## 2016-11-08 DIAGNOSIS — M109 Gout, unspecified: Secondary | ICD-10-CM | POA: Diagnosis not present

## 2016-11-08 DIAGNOSIS — Z713 Dietary counseling and surveillance: Secondary | ICD-10-CM | POA: Diagnosis not present

## 2016-11-08 DIAGNOSIS — Z299 Encounter for prophylactic measures, unspecified: Secondary | ICD-10-CM | POA: Diagnosis not present

## 2016-11-24 DIAGNOSIS — M7541 Impingement syndrome of right shoulder: Secondary | ICD-10-CM | POA: Diagnosis not present

## 2016-11-24 DIAGNOSIS — M25511 Pain in right shoulder: Secondary | ICD-10-CM | POA: Diagnosis not present

## 2016-11-29 DIAGNOSIS — M67911 Unspecified disorder of synovium and tendon, right shoulder: Secondary | ICD-10-CM | POA: Diagnosis not present

## 2016-11-29 DIAGNOSIS — M25511 Pain in right shoulder: Secondary | ICD-10-CM | POA: Diagnosis not present

## 2016-12-04 DIAGNOSIS — M25472 Effusion, left ankle: Secondary | ICD-10-CM | POA: Diagnosis not present

## 2016-12-04 DIAGNOSIS — M76822 Posterior tibial tendinitis, left leg: Secondary | ICD-10-CM | POA: Diagnosis not present

## 2016-12-08 DIAGNOSIS — M25511 Pain in right shoulder: Secondary | ICD-10-CM | POA: Diagnosis not present

## 2016-12-15 DIAGNOSIS — M75111 Incomplete rotator cuff tear or rupture of right shoulder, not specified as traumatic: Secondary | ICD-10-CM | POA: Diagnosis not present

## 2016-12-27 DIAGNOSIS — Z6828 Body mass index (BMI) 28.0-28.9, adult: Secondary | ICD-10-CM | POA: Diagnosis not present

## 2016-12-27 DIAGNOSIS — M109 Gout, unspecified: Secondary | ICD-10-CM | POA: Diagnosis not present

## 2016-12-27 DIAGNOSIS — Z87891 Personal history of nicotine dependence: Secondary | ICD-10-CM | POA: Diagnosis not present

## 2016-12-27 DIAGNOSIS — E7800 Pure hypercholesterolemia, unspecified: Secondary | ICD-10-CM | POA: Diagnosis not present

## 2016-12-27 DIAGNOSIS — Z299 Encounter for prophylactic measures, unspecified: Secondary | ICD-10-CM | POA: Diagnosis not present

## 2016-12-27 DIAGNOSIS — I10 Essential (primary) hypertension: Secondary | ICD-10-CM | POA: Diagnosis not present

## 2017-01-19 ENCOUNTER — Encounter: Payer: Self-pay | Admitting: Family Medicine

## 2017-01-19 ENCOUNTER — Ambulatory Visit (INDEPENDENT_AMBULATORY_CARE_PROVIDER_SITE_OTHER): Payer: Medicare Other | Admitting: Family Medicine

## 2017-01-19 VITALS — BP 102/70 | HR 65 | Temp 98.1°F | Resp 16 | Ht 72.0 in | Wt 207.5 lb

## 2017-01-19 DIAGNOSIS — M109 Gout, unspecified: Secondary | ICD-10-CM | POA: Insufficient documentation

## 2017-01-19 DIAGNOSIS — I10 Essential (primary) hypertension: Secondary | ICD-10-CM | POA: Diagnosis not present

## 2017-01-19 DIAGNOSIS — E785 Hyperlipidemia, unspecified: Secondary | ICD-10-CM

## 2017-01-19 DIAGNOSIS — M1A9XX0 Chronic gout, unspecified, without tophus (tophi): Secondary | ICD-10-CM | POA: Diagnosis not present

## 2017-01-19 DIAGNOSIS — Z0389 Encounter for observation for other suspected diseases and conditions ruled out: Secondary | ICD-10-CM

## 2017-01-19 LAB — CBC WITH DIFFERENTIAL/PLATELET
Basophils Absolute: 0.1 10*3/uL (ref 0.0–0.1)
Basophils Relative: 0.8 % (ref 0.0–3.0)
Eosinophils Absolute: 0.1 10*3/uL (ref 0.0–0.7)
Eosinophils Relative: 1.9 % (ref 0.0–5.0)
HCT: 43.3 % (ref 39.0–52.0)
HEMOGLOBIN: 14.5 g/dL (ref 13.0–17.0)
Lymphocytes Relative: 21.9 % (ref 12.0–46.0)
Lymphs Abs: 1.4 10*3/uL (ref 0.7–4.0)
MCHC: 33.4 g/dL (ref 30.0–36.0)
MCV: 85.7 fl (ref 78.0–100.0)
MONO ABS: 0.7 10*3/uL (ref 0.1–1.0)
Monocytes Relative: 11.3 % (ref 3.0–12.0)
Neutro Abs: 4.1 10*3/uL (ref 1.4–7.7)
Neutrophils Relative %: 64.1 % (ref 43.0–77.0)
Platelets: 194 10*3/uL (ref 150.0–400.0)
RBC: 5.06 Mil/uL (ref 4.22–5.81)
RDW: 14.6 % (ref 11.5–15.5)
WBC: 6.4 10*3/uL (ref 4.0–10.5)

## 2017-01-19 LAB — LIPID PANEL
CHOL/HDL RATIO: 4
Cholesterol: 125 mg/dL (ref 0–200)
HDL: 32.5 mg/dL — ABNORMAL LOW (ref >39.00)
NonHDL: 92.61
Triglycerides: 291 mg/dL — ABNORMAL HIGH (ref 0.0–149.0)
VLDL: 58.2 mg/dL — ABNORMAL HIGH (ref 0.0–40.0)

## 2017-01-19 LAB — BASIC METABOLIC PANEL
BUN: 22 mg/dL (ref 6–23)
CHLORIDE: 106 meq/L (ref 96–112)
CO2: 31 mEq/L (ref 19–32)
CREATININE: 1.31 mg/dL (ref 0.40–1.50)
Calcium: 9.6 mg/dL (ref 8.4–10.5)
GFR: 57.81 mL/min — ABNORMAL LOW (ref >60.00)
Glucose, Bld: 91 mg/dL (ref 70–99)
POTASSIUM: 4.2 meq/L (ref 3.5–5.1)
Sodium: 141 mEq/L (ref 135–145)

## 2017-01-19 LAB — HEPATIC FUNCTION PANEL
ALT: 21 U/L (ref 0–53)
AST: 13 U/L (ref 0–37)
Albumin: 4.2 g/dL (ref 3.5–5.2)
Alkaline Phosphatase: 98 U/L (ref 39–117)
BILIRUBIN TOTAL: 0.4 mg/dL (ref 0.2–1.2)
Bilirubin, Direct: 0.1 mg/dL (ref 0.0–0.3)
Total Protein: 6.6 g/dL (ref 6.0–8.3)

## 2017-01-19 LAB — LDL CHOLESTEROL, DIRECT: Direct LDL: 61 mg/dL

## 2017-01-19 LAB — TSH: TSH: 1.32 u[IU]/mL (ref 0.35–4.50)

## 2017-01-19 LAB — URIC ACID: URIC ACID, SERUM: 7.4 mg/dL (ref 4.0–7.8)

## 2017-01-19 NOTE — Progress Notes (Signed)
Pre visit review using our clinic review tool, if applicable. No additional management support is needed unless otherwise documented below in the visit note. 

## 2017-01-19 NOTE — Assessment & Plan Note (Signed)
Pt is seeing Dr Raliegh Ip at cardiology.  Is on ASA, beta blocker and statin.  Will follow along and assist as able.

## 2017-01-19 NOTE — Patient Instructions (Signed)
Schedule your annual wellness visit in 6 months w/ Maudie Mercury (our [employer]) and a BP and cholesterol visit with me We'll notify you of your lab results and make any changes if needed Continue to work on healthy diet and regular exercise- you can do it! Call with any questions or concerns Welcome!  We're glad to have you!!!

## 2017-01-19 NOTE — Progress Notes (Signed)
   Subjective:    Patient ID: [name], male    DOB: [birth year], 71 y.o.   MRN: [id number]  HPI  New to establish.  Previous MD- Vis in [place]  UTD on colonoscopy  HTN- chronic problem, on Lisinopril HCTZ, Metoprolol w/ good control.  Denies CP, SOB, HAs, visual changes, edema.  Hyperlipidemia- chronic problem, on Simvastatin daily.  Denies abd pain, N/V, myalgias  Gout- chronic problem, on Allopurinol daily and colcrys as needed.  Pt has not had recent flare.  Now eating low purine diet  CAD- following w/ Dr  Raliegh Ip (cardiology).  On ASA and beta blocker and statin  Chronic back pain- s/p back surgery w/ Dr Sherwood Gambler, on gabapentin TID.   Review of Systems For ROS see HPI     Objective:   Physical Exam  Constitutional: He is oriented to person, place, and time. He appears well-developed and well-nourished. No distress.  HENT:  Head: Normocephalic and atraumatic.  Eyes: Conjunctivae and EOM are normal. Pupils are equal, round, and reactive to light.  Neck: Normal range of motion. Neck supple. No thyromegaly present.  Cardiovascular: Normal rate, regular rhythm, normal heart sounds and intact distal pulses.   No murmur heard. Pulmonary/Chest: Effort normal and breath sounds normal. No respiratory distress.  Abdominal: Soft. Bowel sounds are normal. He exhibits no distension.  Musculoskeletal: He exhibits no edema.  Lymphadenopathy:    He has no cervical adenopathy.  Neurological: He is alert and oriented to person, place, and time. No cranial nerve deficit.  Skin: Skin is warm and dry.  Psychiatric: He has a normal mood and affect. His behavior is normal.  Vitals reviewed.         Assessment & Plan:

## 2017-01-19 NOTE — Assessment & Plan Note (Signed)
New to provider, ongoing for pt.  Tolerating statin w/o difficulty.  Stressed need for healthy diet and regular exercise.  Check labs.  Adjust meds prn  

## 2017-01-19 NOTE — Assessment & Plan Note (Signed)
New to provider, ongoing for pt.  Well controlled today.  Asymptomatic.  Check labs.  No anticipated med changes.

## 2017-01-19 NOTE — Assessment & Plan Note (Signed)
New to provider, ongoing for pt.  Currently asymptomatic.  On Allopurinol daily.  Following a low purine diet.  Check uric acid level.  Will follow.

## 2017-01-22 ENCOUNTER — Encounter: Payer: Self-pay | Admitting: General Practice

## 2017-01-30 DIAGNOSIS — M4156 Other secondary scoliosis, lumbar region: Secondary | ICD-10-CM | POA: Diagnosis not present

## 2017-01-30 DIAGNOSIS — M545 Low back pain: Secondary | ICD-10-CM | POA: Diagnosis not present

## 2017-01-30 DIAGNOSIS — M5136 Other intervertebral disc degeneration, lumbar region: Secondary | ICD-10-CM | POA: Diagnosis not present

## 2017-01-30 DIAGNOSIS — M4726 Other spondylosis with radiculopathy, lumbar region: Secondary | ICD-10-CM | POA: Diagnosis not present

## 2017-01-30 DIAGNOSIS — M5416 Radiculopathy, lumbar region: Secondary | ICD-10-CM | POA: Diagnosis not present

## 2017-02-16 ENCOUNTER — Other Ambulatory Visit: Payer: Self-pay | Admitting: Cardiovascular Disease

## 2017-03-26 ENCOUNTER — Ambulatory Visit (INDEPENDENT_AMBULATORY_CARE_PROVIDER_SITE_OTHER): Payer: Medicare Other | Admitting: Physician Assistant

## 2017-03-26 ENCOUNTER — Encounter: Payer: Self-pay | Admitting: Physician Assistant

## 2017-03-26 VITALS — BP 110/70 | HR 64 | Temp 97.7°F | Resp 14 | Ht 72.0 in | Wt 211.0 lb

## 2017-03-26 DIAGNOSIS — R1032 Left lower quadrant pain: Secondary | ICD-10-CM | POA: Diagnosis not present

## 2017-03-26 NOTE — Progress Notes (Signed)
Pre visit review using our clinic review tool, if applicable. No additional management support is needed unless otherwise documented below in the visit note. 

## 2017-03-26 NOTE — Progress Notes (Signed)
Patient presents to clinic today c/o pain in L groin s/p lifting a well top about a week ago. Denies any pain at time of heavy lifting. Noticed pain starting a few days later. Denies any bulging in the area. Pain is described as intermittent and sharp, non-radiating. Is worse with prolonged ambulation and resolves with rest. Denies change to bowel or bladder habits. Has been trying to avoid heavy lifting. Is taking Relafen for his back, prescribed by Neurosurgery. Has helped some with this pain.   Past Medical History:  Diagnosis Date  . Cellulitis and abscess of other specified site   . Colon polyps   . Hypertension     Current Outpatient Prescriptions on File Prior to Visit  Medication Sig Dispense Refill  . allopurinol (ZYLOPRIM) 100 MG tablet     . aspirin EC 81 MG tablet Take 81 mg by mouth daily.    Marland Kitchen COLCRYS 0.6 MG tablet Take 0.6 mg by mouth daily as needed.  1  . EPIPEN 2-PAK 0.3 MG/0.3ML SOAJ injection   3  . gabapentin (NEURONTIN) 300 MG capsule Take 300 mg by mouth 3 (three) times daily.     Marland Kitchen HYDROcodone-acetaminophen (NORCO/VICODIN) 5-325 MG per tablet Take 1 tablet by mouth as needed.    Marland Kitchen lisinopril-hydrochlorothiazide (PRINZIDE,ZESTORETIC) 10-12.5 MG tablet TAKE 1 TABLET BY MOUTH DAILY. 90 tablet 2  . metoprolol succinate (TOPROL-XL) 25 MG 24 hr tablet Take 1 tablet (25 mg total) by mouth daily. 90 tablet 0  . simvastatin (ZOCOR) 40 MG tablet Take 1 tablet (40 mg total) by mouth daily. 90 tablet 0  . diclofenac (VOLTAREN) 75 MG EC tablet Take 1 tablet by mouth 2 (two) times daily as needed.     No current facility-administered medications on file prior to visit.     Allergies  Allergen Reactions  . Bee Venom Anaphylaxis    Family History  Problem Relation Age of Onset  . Cancer Other     Family Hx of Cancer, CAD,Diabetes,Kidney Failure  . Colon cancer Neg Hx     Social History   Social History  . Marital status: Married    Spouse name: N/A  . Number of  children: N/A  . Years of education: N/A   Occupational History  . 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 Smoking status: Former Smoker    Packs/day: 3.00    Years: 30.00    Types: Cigarettes    Start date: 12/18/1957    Quit date: 12/18/1996  . Smokeless tobacco: Never Used     Comment: started smoking as a child  . Alcohol use 0.0 oz/week     Comment: beer  . Drug use: No  . Sexual activity: Not Asked   Other Topics Concern  . None   Social History Narrative  . None   Review of Systems - See HPI.  All other ROS are negative.  BP 110/70   Pulse 64   Temp 97.7 F (36.5 C) (Oral)   Resp 14   Ht 6' (1.829 m)   Wt 211 lb (95.7 kg)   SpO2 95%   BMI 28.62 kg/m   Physical Exam  Constitutional: He is oriented to person, place, and time and well-developed, well-nourished, and in no distress.  HENT:  Head: Normocephalic and atraumatic.  Eyes: Conjunctivae are normal.  Cardiovascular: Normal rate, regular rhythm, normal heart sounds and intact distal pulses.   Pulmonary/Chest: Effort normal and breath sounds normal. No respiratory distress.  He has no wheezes. He has no rales. He exhibits no tenderness.  Abdominal: Soft. There is no tenderness. No hernia. Hernia confirmed negative in the ventral area, confirmed negative in the right inguinal area and confirmed negative in the left inguinal area.  Musculoskeletal:       Left hip: He exhibits normal range of motion and normal strength.  Pain with abduction of L hip/LLE  Neurological: He is alert and oriented to person, place, and time.  Skin: Skin is warm and dry. No rash noted.  Psychiatric: Affect normal.  Vitals reviewed.  Recent Results (from the past 2160 hour(s))  Lipid panel     Status: Abnormal   Collection Time: 01/19/17  2:11 PM  Result Value Ref Range   Cholesterol 125 0 - 200 mg/dL    Comment: ATP III Classification       Desirable:  < 200 mg/dL               Borderline High:  200 - 239 mg/dL          High:   > = 240 mg/dL   Triglycerides 291.0 (H) 0.0 - 149.0 mg/dL    Comment: Normal:  <150 mg/dLBorderline High:  150 - 199 mg/dL   HDL 32.50 (L) >39.00 mg/dL   VLDL 58.2 (H) 0.0 - 40.0 mg/dL   Total CHOL/HDL Ratio 4     Comment:                Men          Women1/2 Average Risk     3.4          3.3Average Risk          5.0          4.42X Average Risk          9.6          7.13X Average Risk          15.0          11.0                       NonHDL 92.61     Comment: NOTE:  Non-HDL goal should be 30 mg/dL higher than patient's LDL goal (i.e. LDL goal of < 70 mg/dL, would have non-HDL goal of < 100 mg/dL)  Basic metabolic panel     Status: Abnormal   Collection Time: 01/19/17  2:11 PM  Result Value Ref Range   Sodium 141 135 - 145 mEq/L   Potassium 4.2 3.5 - 5.1 mEq/L   Chloride 106 96 - 112 mEq/L   CO2 31 19 - 32 mEq/L   Glucose, Bld 91 70 - 99 mg/dL   BUN 22 6 - 23 mg/dL   Creatinine, Ser 1.31 0.40 - 1.50 mg/dL   Calcium 9.6 8.4 - 10.5 mg/dL   GFR 57.81 (L) >60.00 mL/min  Hepatic function panel     Status: None   Collection Time: 01/19/17  2:11 PM  Result Value Ref Range   Total Bilirubin 0.4 0.2 - 1.2 mg/dL   Bilirubin, Direct 0.1 0.0 - 0.3 mg/dL   Alkaline Phosphatase 98 39 - 117 U/L   AST 13 0 - 37 U/L   ALT 21 0 - 53 U/L   Total Protein 6.6 6.0 - 8.3 g/dL   Albumin 4.2 3.5 - 5.2 g/dL  TSH     Status: None   Collection Time: 01/19/17  2:11  PM  Result Value Ref Range   TSH 1.32 0.35 - 4.50 uIU/mL  CBC with Differential/Platelet     Status: None   Collection Time: 01/19/17  2:11 PM  Result Value Ref Range   WBC 6.4 4.0 - 10.5 K/uL   RBC 5.06 4.22 - 5.81 Mil/uL   Hemoglobin 14.5 13.0 - 17.0 g/dL   HCT 43.3 39.0 - 52.0 %   MCV 85.7 78.0 - 100.0 fl   MCHC 33.4 30.0 - 36.0 g/dL   RDW 14.6 11.5 - 15.5 %   Platelets 194.0 150.0 - 400.0 K/uL   Neutrophils Relative % 64.1 43.0 - 77.0 %   Lymphocytes Relative 21.9 12.0 - 46.0 %   Monocytes Relative 11.3 3.0 - 12.0 %   Eosinophils  Relative 1.9 0.0 - 5.0 %   Basophils Relative 0.8 0.0 - 3.0 %   Neutro Abs 4.1 1.4 - 7.7 K/uL   Lymphs Abs 1.4 0.7 - 4.0 K/uL   Monocytes Absolute 0.7 0.1 - 1.0 K/uL   Eosinophils Absolute 0.1 0.0 - 0.7 K/uL   Basophils Absolute 0.1 0.0 - 0.1 K/uL  Uric acid     Status: None   Collection Time: 01/19/17  2:11 PM  Result Value Ref Range   Uric Acid, Serum 7.4 4.0 - 7.8 mg/dL  LDL cholesterol, direct     Status: None   Collection Time: 01/19/17  2:11 PM  Result Value Ref Range   Direct LDL 61.0 mg/dL    Comment: Optimal:  <100 mg/dLNear or Above Optimal:  100-129 mg/dLBorderline High:  130-159 mg/dLHigh:  160-189 mg/dLVery High:  >190 mg/dL   Assessment/Plan: 1. Left inguinal pain s/p episode of heavy lifting. No palpable hernia on examination but discussed with patient that small hernias may not always be palpable. No change to bowel or bladder habits. Pain in left inguinal region with abduction of LLE. Muscular strain most likely. Continue pain medication and NSAID as directed by specialist. No heavy lifting. Ambulation in moderation. If not improving, will need further assessment.     Leeanne Rio, PA-C

## 2017-03-26 NOTE — Patient Instructions (Signed)
Please continue pain medication/anti-inflammatories as directed by your specialist.  No heavy lifting over the next week. Can walk normally but no jogging or increased ambulation. Rest as needed. Symptoms should continue to gradually improve/resolve.  As discussed, even though I do not feel a hernia on examination, sometimes we cannot palpate very small hernias. If you note non-improving or worsening symptoms, or new-onset bulge in the area, we will need further assessment.

## 2017-04-09 DIAGNOSIS — M545 Low back pain: Secondary | ICD-10-CM | POA: Diagnosis not present

## 2017-04-09 DIAGNOSIS — M5136 Other intervertebral disc degeneration, lumbar region: Secondary | ICD-10-CM | POA: Diagnosis not present

## 2017-04-09 DIAGNOSIS — M5416 Radiculopathy, lumbar region: Secondary | ICD-10-CM | POA: Diagnosis not present

## 2017-04-09 DIAGNOSIS — M4156 Other secondary scoliosis, lumbar region: Secondary | ICD-10-CM | POA: Diagnosis not present

## 2017-04-09 DIAGNOSIS — M4726 Other spondylosis with radiculopathy, lumbar region: Secondary | ICD-10-CM | POA: Diagnosis not present

## 2017-04-16 DIAGNOSIS — M4726 Other spondylosis with radiculopathy, lumbar region: Secondary | ICD-10-CM | POA: Diagnosis not present

## 2017-04-18 DIAGNOSIS — M47816 Spondylosis without myelopathy or radiculopathy, lumbar region: Secondary | ICD-10-CM | POA: Diagnosis not present

## 2017-04-18 DIAGNOSIS — M4726 Other spondylosis with radiculopathy, lumbar region: Secondary | ICD-10-CM | POA: Diagnosis not present

## 2017-04-18 DIAGNOSIS — M48061 Spinal stenosis, lumbar region without neurogenic claudication: Secondary | ICD-10-CM | POA: Diagnosis not present

## 2017-04-20 ENCOUNTER — Other Ambulatory Visit: Payer: Self-pay | Admitting: Emergency Medicine

## 2017-04-20 MED ORDER — DICLOFENAC SODIUM 75 MG PO TBEC: 75.0000 mg | DELAYED_RELEASE_TABLET | Freq: Two times a day (BID) | ORAL | 3 refills | Status: DC | PRN

## 2017-04-20 NOTE — Telephone Encounter (Signed)
Patient wife [name] called for refill request of Diclofenac, originally filled by Dr Terie Purser previous PCP. He does take twice daily for his back Please advise

## 2017-05-03 DIAGNOSIS — M5136 Other intervertebral disc degeneration, lumbar region: Secondary | ICD-10-CM | POA: Diagnosis not present

## 2017-05-03 DIAGNOSIS — M545 Low back pain: Secondary | ICD-10-CM | POA: Diagnosis not present

## 2017-05-03 DIAGNOSIS — M4156 Other secondary scoliosis, lumbar region: Secondary | ICD-10-CM | POA: Diagnosis not present

## 2017-05-03 DIAGNOSIS — M4726 Other spondylosis with radiculopathy, lumbar region: Secondary | ICD-10-CM | POA: Diagnosis not present

## 2017-05-03 DIAGNOSIS — M48062 Spinal stenosis, lumbar region with neurogenic claudication: Secondary | ICD-10-CM | POA: Diagnosis not present

## 2017-05-03 DIAGNOSIS — M5416 Radiculopathy, lumbar region: Secondary | ICD-10-CM | POA: Diagnosis not present

## 2017-07-18 ENCOUNTER — Encounter: Payer: Self-pay | Admitting: Family Medicine

## 2017-07-18 ENCOUNTER — Ambulatory Visit (INDEPENDENT_AMBULATORY_CARE_PROVIDER_SITE_OTHER): Payer: Medicare Other | Admitting: Family Medicine

## 2017-07-18 ENCOUNTER — Encounter: Payer: Self-pay | Admitting: General Practice

## 2017-07-18 ENCOUNTER — Ambulatory Visit: Payer: Medicare Other

## 2017-07-18 VITALS — BP 112/72 | HR 56 | Temp 97.9°F | Resp 16 | Ht 72.0 in | Wt 205.2 lb

## 2017-07-18 DIAGNOSIS — Z125 Encounter for screening for malignant neoplasm of prostate: Secondary | ICD-10-CM | POA: Diagnosis not present

## 2017-07-18 DIAGNOSIS — M1A9XX0 Chronic gout, unspecified, without tophus (tophi): Secondary | ICD-10-CM

## 2017-07-18 DIAGNOSIS — Z0000 Encounter for general adult medical examination without abnormal findings: Secondary | ICD-10-CM

## 2017-07-18 DIAGNOSIS — I10 Essential (primary) hypertension: Secondary | ICD-10-CM

## 2017-07-18 DIAGNOSIS — Z23 Encounter for immunization: Secondary | ICD-10-CM

## 2017-07-18 DIAGNOSIS — E785 Hyperlipidemia, unspecified: Secondary | ICD-10-CM

## 2017-07-18 LAB — CBC WITH DIFFERENTIAL/PLATELET
Basophils Absolute: 0 10*3/uL (ref 0.0–0.1)
Basophils Relative: 0.7 % (ref 0.0–3.0)
EOS PCT: 1.8 % (ref 0.0–5.0)
Eosinophils Absolute: 0.1 10*3/uL (ref 0.0–0.7)
HEMATOCRIT: 45 % (ref 39.0–52.0)
Hemoglobin: 14.6 g/dL (ref 13.0–17.0)
LYMPHS ABS: 1.3 10*3/uL (ref 0.7–4.0)
LYMPHS PCT: 20.9 % (ref 12.0–46.0)
MCHC: 32.4 g/dL (ref 30.0–36.0)
MCV: 87.8 fl (ref 78.0–100.0)
MONOS PCT: 9.2 % (ref 3.0–12.0)
Monocytes Absolute: 0.6 10*3/uL (ref 0.1–1.0)
NEUTROS ABS: 4.2 10*3/uL (ref 1.4–7.7)
NEUTROS PCT: 67.4 % (ref 43.0–77.0)
PLATELETS: 202 10*3/uL (ref 150.0–400.0)
RBC: 5.13 Mil/uL (ref 4.22–5.81)
RDW: 14.2 % (ref 11.5–15.5)
WBC: 6.3 10*3/uL (ref 4.0–10.5)

## 2017-07-18 LAB — BASIC METABOLIC PANEL
BUN: 21 mg/dL (ref 6–23)
CO2: 31 mEq/L (ref 19–32)
Calcium: 9.8 mg/dL (ref 8.4–10.5)
Chloride: 103 mEq/L (ref 96–112)
Creatinine, Ser: 1.24 mg/dL (ref 0.40–1.50)
GFR: 61.5 mL/min (ref >60.00)
Glucose, Bld: 88 mg/dL (ref 70–99)
POTASSIUM: 4.7 meq/L (ref 3.5–5.1)
SODIUM: 140 meq/L (ref 135–145)

## 2017-07-18 LAB — LIPID PANEL
CHOL/HDL RATIO: 4
Cholesterol: 137 mg/dL (ref 0–200)
HDL: 32 mg/dL — ABNORMAL LOW (ref >39.00)
LDL CALC: 65 mg/dL (ref 0–99)
NONHDL: 104.53
Triglycerides: 200 mg/dL — ABNORMAL HIGH (ref 0.0–149.0)
VLDL: 40 mg/dL (ref 0.0–40.0)

## 2017-07-18 LAB — HEPATIC FUNCTION PANEL
ALK PHOS: 94 U/L (ref 39–117)
ALT: 23 U/L (ref 0–53)
AST: 17 U/L (ref 0–37)
Albumin: 4.4 g/dL (ref 3.5–5.2)
BILIRUBIN TOTAL: 0.6 mg/dL (ref 0.2–1.2)
Bilirubin, Direct: 0.1 mg/dL (ref 0.0–0.3)
Total Protein: 6.8 g/dL (ref 6.0–8.3)

## 2017-07-18 LAB — PSA, MEDICARE: PSA: 0.55 ng/ml (ref 0.10–4.00)

## 2017-07-18 LAB — TSH: TSH: 1.55 u[IU]/mL (ref 0.35–4.50)

## 2017-07-18 MED ORDER — ZOSTER VAC RECOMB ADJUVANTED 50 MCG/0.5ML IM SUSR: 0.5000 mL | Freq: Once | INTRAMUSCULAR | 1 refills | Status: AC

## 2017-07-18 MED ORDER — ALLOPURINOL 100 MG PO TABS: 100.0000 mg | ORAL_TABLET | Freq: Every day | ORAL | 1 refills | Status: DC

## 2017-07-18 NOTE — Assessment & Plan Note (Signed)
Chronic problem.  Well controlled today.  Asymptomatic.  Check labs.  No anticipated med changes.  Will follow. 

## 2017-07-18 NOTE — Progress Notes (Signed)
   Subjective:    Patient ID: [name], male    DOB: January 27, 1946, 71 y.o.   MRN: [id number]  HPI HTN- chronic problem, on Lisinopril HCTZ and Metoprolol daily w/ good control.  Denies CP, SOB, HAs, visual changes, edema  Hyperlipidemia- chronic problem, on Simvastatin 40mg  daily.  Denies abd pain, N/V, myalgias.  Gout- chronic problem, on Allopurinol as a daily preventative w/ good control thus far.  Needs a refill   Review of Systems For ROS see HPI     Objective:   Physical Exam  Constitutional: He is oriented to person, place, and time. He appears well-developed and well-nourished. No distress.  HENT:  Head: Normocephalic and atraumatic.  Eyes: Pupils are equal, round, and reactive to light. Conjunctivae and EOM are normal.  Neck: Normal range of motion. Neck supple. No thyromegaly present.  Cardiovascular: Normal rate, regular rhythm, normal heart sounds and intact distal pulses.   No murmur heard. Pulmonary/Chest: Effort normal and breath sounds normal. No respiratory distress.  Abdominal: Soft. Bowel sounds are normal. He exhibits no distension.  Musculoskeletal: He exhibits no edema.  Lymphadenopathy:    He has no cervical adenopathy.  Neurological: He is alert and oriented to person, place, and time. No cranial nerve deficit.  Skin: Skin is warm and dry.  Psychiatric: He has a normal mood and affect. His behavior is normal.  Vitals reviewed.         Assessment & Plan:

## 2017-07-18 NOTE — Progress Notes (Addendum)
Subjective:   [name] is a 71 y.o. male who presents for an Initial Medicare Annual Wellness Visit.  Review of Systems  No ROS.  Medicare Wellness Visit. Additional risk factors are reflected in the social history.  Cardiac Risk Factors include: advanced age (>32men, >61 women);dyslipidemia;hypertension;male gender   Sleep patterns: Sleeps 5-7 hours, feels rested.  Home Safety/Smoke Alarms: Feels safe in home. Smoke alarms in place.  Living environment; residence and Firearm Safety: Lives with wife in 1 story home.  Seat Belt Safety/Bike Helmet: Wears seat belt.   Counseling:   Eye Exam-Last exam > 2 years. Only with issues.   Dental-Full dentures, gum care discussed.   Male:   CCS-Colonoscopy 08/26/2015, pt reports polyps.     PSA- Ordered today by PCP     Objective:    Today's Vitals   07/18/17 0932  BP: 112/72  Pulse: (!) 56  Resp: 16  Temp: 97.9 F (36.6 C)  TempSrc: Oral  SpO2: 98%  Weight: 205 lb 4 oz (93.1 kg)  Height: 6' (1.829 m)   Body mass index is 27.84 kg/m.  Current Medications (verified) Outpatient Encounter Prescriptions as of 07/18/2017  Medication Sig  . aspirin EC 81 MG tablet Take 81 mg by mouth daily.  Marland Kitchen EPIPEN 2-PAK 0.3 MG/0.3ML SOAJ injection   . gabapentin (NEURONTIN) 300 MG capsule Take 300 mg by mouth 3 (three) times daily.   Marland Kitchen HYDROcodone-acetaminophen (NORCO/VICODIN) 5-325 MG per tablet Take 1 tablet by mouth as needed.  Marland Kitchen lisinopril-hydrochlorothiazide (PRINZIDE,ZESTORETIC) 10-12.5 MG tablet TAKE 1 TABLET BY MOUTH DAILY.  . metoprolol succinate (TOPROL-XL) 25 MG 24 hr tablet Take 1 tablet (25 mg total) by mouth daily.  . nabumetone (RELAFEN) 500 MG tablet Take 500 mg by mouth 2 (two) times daily as needed.  . simvastatin (ZOCOR) 40 MG tablet Take 1 tablet (40 mg total) by mouth daily.  . [DISCONTINUED] allopurinol (ZYLOPRIM) 100 MG tablet   . [DISCONTINUED] COLCRYS 0.6 MG tablet Take 0.6 mg by mouth daily as needed.  .  [DISCONTINUED] diclofenac (VOLTAREN) 75 MG EC tablet Take 1 tablet (75 mg total) by mouth 2 (two) times daily as needed.  Marland Kitchen allopurinol (ZYLOPRIM) 100 MG tablet Take 1 tablet (100 mg total) by mouth daily.  Marland Kitchen Zoster Vac Recomb Adjuvanted [facility]) injection Inject 0.5 mLs into the muscle once.   No facility-administered encounter medications on file as of 07/18/2017.     Allergies (verified) Bee venom   History: Past Medical History:  Diagnosis Date  . Cellulitis and abscess of other specified site   . Colon polyps   . Hypertension    Past Surgical History:  Procedure Laterality Date  . BACK SURGERY    . COLONOSCOPY  01/10/2013   Procedure: COLONOSCOPY;  Surgeon: Rogene Houston, MD;  Location[facility];  Service: Endoscopy;  Laterality: N/A;  730  . COLONOSCOPY N/A 08/21/2013   Procedure: COLONOSCOPY;  Surgeon: Rogene Houston, MD;  Location: [facility];  Service: Endoscopy;  Laterality: N/A;  1030  . COLONOSCOPY N/A 08/26/2015   Procedure: COLONOSCOPY;  Surgeon: Rogene Houston, MD;  Location: [facility];  Service: Endoscopy;  Laterality: N/A;  240  . colonscopy     Family History  Problem Relation Age of Onset  . Cancer Other        Family Hx of Cancer, CAD,Diabetes,Kidney Failure  . Colon cancer Neg Hx    Social History   Occupational History  . PLUMBING  Social History Main Topics  . Smoking status: Former Smoker    Packs/day: 3.00    Years: 30.00    Types: Cigarettes    Start date: 12/18/1957    Quit date: 12/18/1996  . Smokeless tobacco: Never Used     Comment: started smoking as a child  . Alcohol use 0.0 oz/week     Comment: beer  . Drug use: No  . Sexual activity: Not on file   Tobacco Counseling Counseling given: Yes   Activities of Daily Living In your present state of health, do you have any difficulty performing the following activities: 07/18/2017 07/18/2017  Hearing? N N  Vision? N N  Difficulty concentrating or making decisions? N N    Walking or climbing stairs? N N  Dressing or bathing? N N  Doing errands, shopping? N N  Preparing Food and eating ? N -  Using the Toilet? N -  In the past six months, have you accidently leaked urine? N -  Do you have problems with loss of bowel control? N -  Managing your Medications? N -  Managing your Finances? N -  Housekeeping or managing your Housekeeping? N -  Some recent data might be hidden    Immunizations and Health Maintenance Immunization History  Administered Date(s) Administered  . Pneumococcal Conjugate-13 07/18/2017  . Tdap 07/30/2007  . Zoster 01/19/2015   There are no preventive care reminders to display for this patient.  Patient Care Team: Midge Minium, MD as PCP - General (Family Medicine) Jovita Gamma, MD as Consulting Physician (Neurosurgery) Herminio Commons, MD as Attending Physician (Cardiology) Rogene Houston, MD as Consulting Physician (Gastroenterology)  Indicate any recent Medical Services you may have received from other than [facility] providers in the past year (date may be approximate).    Assessment:   This is a routine wellness examination for [name]. Physical assessment deferred to PCP.   Hearing/Vision screen Hearing Screening Comments: Able to hear conversational tones w/o difficulty. No issues reported.   Vision Screening Comments: Wears glasses.   Dietary issues and exercise activities discussed: Current Exercise Habits: The patient does not participate in regular exercise at present, Exercise limited by: orthopedic condition(s)  Diet (meal preparation, eat out, water intake, caffeinated beverages, dairy products, fruits and vegetables): Drinks caffeine free soda and water  Breakfast: honeybun; egg Lunch: sandwich, chips Dinner: protein and vegetables     Discussed heart healthy diet and routine exercise.   Goals      Patient Stated   . maintain current health (pt-stated)          Maintain current health       Depression Screen PHQ 2/9 Scores 07/18/2017 07/18/2017 01/19/2017  PHQ - 2 Score 0 0 0  PHQ- 9 Score - 0 0    Fall Risk Fall Risk  07/18/2017 07/18/2017 01/19/2017  Falls in the past year? No No No    Cognitive Function:       Ad8 score reviewed for issues:  Issues making decisions: no  Less interest in hobbies / activities: no  Repeats questions, stories (family complaining): no  Trouble using ordinary gadgets (microwave, computer, phone): no  Forgets the month or year: no  Mismanaging finances: no  Remembering appts: no  Daily problems with thinking and/or memory: no Ad8 score is=0     Screening Tests Health Maintenance  Topic Date Due  . INFLUENZA VACCINE  09/17/2017 (Originally 07/18/2017)  . Hepatitis C Screening  09/17/2017 (Originally July 24, 1946)  .  TETANUS/TDAP  07/29/2017  . PNA vac Low Risk Adult (2 of 2 - PPSV23) 07/18/2018  . COLONOSCOPY  08/25/2025   Shingrix Vaccine Rx sent to pharmacy PCV13 administered today      Plan:    Bring a copy of your advance directives to your next office visit.  Continue doing brain stimulating activities (puzzles, reading, adult coloring books, staying active) to keep memory sharp.   Shingles vaccine at pharmacy  I have personally reviewed and noted the following in the patient's chart:   . Medical and social history . Use of alcohol, tobacco or illicit drugs  . Current medications and supplements . Functional ability and status . Nutritional status . Physical activity . Advanced directives . List of other physicians . Hospitalizations, surgeries, and ER visits in previous 12 months . Vitals . Screenings to include cognitive, depression, and falls . Referrals and appointments  In addition, I have reviewed and discussed with patient certain preventive protocols, quality metrics, and best practice recommendations. A written personalized care plan for preventive services as well as general preventive health  recommendations were provided to patient.     Gerilyn Nestle, RN   07/18/2017    Reviewed documentation above and agree w/ RN.  [name], MD

## 2017-07-18 NOTE — Addendum Note (Signed)
Addended by: Gerilyn Nestle on: 07/18/2017 10:39 AM   Modules accepted: Orders

## 2017-07-18 NOTE — Assessment & Plan Note (Signed)
Chronic problem.  Tolerating statin w/o difficulty.  Check labs.  Adjust meds prn  

## 2017-07-18 NOTE — Progress Notes (Signed)
Pre visit review using our clinic review tool, if applicable. No additional management support is needed unless otherwise documented below in the visit note. 

## 2017-07-18 NOTE — Patient Instructions (Addendum)
Follow up in 6 months to recheck cholesterol and BP We'll notify you of your lab results and make any changes if needed Continue to work on healthy diet and regular exercise- you look great! Call with any questions or concerns Enjoy the rest of your summer!!!  Bring a copy of your advance directives to your next office visit.  Continue doing brain stimulating activities (puzzles, reading, adult coloring books, staying active) to keep memory sharp.   Shingles vaccine at pharmacy   Health Maintenance, Male A healthy lifestyle and preventive care is important for your health and wellness. Ask your health care provider about what schedule of regular examinations is right for you. What should I know about weight and diet? Eat a Healthy Diet  Eat plenty of vegetables, fruits, whole grains, low-fat dairy products, and lean protein.  Do not eat a lot of foods high in solid fats, added sugars, or salt.  Maintain a Healthy Weight Regular exercise can help you achieve or maintain a healthy weight. You should:  Do at least 150 minutes of exercise each week. The exercise should increase your heart rate and make you sweat (moderate-intensity exercise).  Do strength-training exercises at least twice a week.  Watch Your Levels of Cholesterol and Blood Lipids  Have your blood tested for lipids and cholesterol every 5 years starting at 69 years of age. If you are at high risk for heart disease, you should start having your blood tested when you are 69 years old. You may need to have your cholesterol levels checked more often if: ? Your lipid or cholesterol levels are high. ? You are older than 69 years of age. ? You are at high risk for heart disease.  What should I know about cancer screening? Many types of cancers can be detected early and may often be prevented. Lung Cancer  You should be screened every year for lung cancer if: ? You are a current smoker who has smoked for at least 30  years. ? You are a former smoker who has quit within the past 15 years.  Talk to your health care provider about your screening options, when you should start screening, and how often you should be screened.  Colorectal Cancer  Routine colorectal cancer screening usually begins at 69 years of age and should be repeated every 5-10 years until you are 69 years old. You may need to be screened more often if early forms of precancerous polyps or small growths are found. Your health care provider may recommend screening at an earlier age if you have risk factors for colon cancer.  Your health care provider may recommend using home test kits to check for hidden blood in the stool.  A small camera at the end of a tube can be used to examine your colon (sigmoidoscopy or colonoscopy). This checks for the earliest forms of colorectal cancer.  Prostate and Testicular Cancer  Depending on your age and overall health, your health care provider may do certain tests to screen for prostate and testicular cancer.  Talk to your health care provider about any symptoms or concerns you have about testicular or prostate cancer.  Skin Cancer  Check your skin from head to toe regularly.  Tell your health care provider about any new moles or changes in moles, especially if: ? There is a change in a mole's size, shape, or color. ? You have a mole that is larger than a pencil eraser.  Always use sunscreen. Apply sunscreen  liberally and repeat throughout the day.  Protect yourself by wearing long sleeves, pants, a wide-brimmed hat, and sunglasses when outside.  What should I know about heart disease, diabetes, and high blood pressure?  If you are 87-38 years of age, have your blood pressure checked every 3-5 years. If you are 89 years of age or older, have your blood pressure checked every year. You should have your blood pressure measured twice-once when you are at a hospital or clinic, and once when you are  not at a hospital or clinic. Record the average of the two measurements. To check your blood pressure when you are not at a hospital or clinic, you can use: ? An automated blood pressure machine at a pharmacy. ? A home blood pressure monitor.  Talk to your health care provider about your target blood pressure.  If you are between 11-56 years old, ask your health care provider if you should take aspirin to prevent heart disease.  Have regular diabetes screenings by checking your fasting blood sugar level. ? If you are at a normal weight and have a low risk for diabetes, have this test once every three years after the age of 71. ? If you are overweight and have a high risk for diabetes, consider being tested at a younger age or more often.  A one-time screening for abdominal aortic aneurysm (AAA) by ultrasound is recommended for men aged 55-75 years who are current or former smokers. What should I know about preventing infection? Hepatitis B If you have a higher risk for hepatitis B, you should be screened for this virus. Talk with your health care provider to find out if you are at risk for hepatitis B infection. Hepatitis C Blood testing is recommended for:  Everyone born from [birth year] through 1963.  Anyone with known risk factors for hepatitis C.  Sexually Transmitted Diseases (STDs)  You should be screened each year for STDs including gonorrhea and chlamydia if: ? You are sexually active and are younger than 69 years of age. ? You are older than 69 years of age and your health care provider tells you that you are at risk for this type of infection. ? Your sexual activity has changed since you were last screened and you are at an increased risk for chlamydia or gonorrhea. Ask your health care provider if you are at risk.  Talk with your health care provider about whether you are at high risk of being infected with HIV. Your health care provider may recommend a prescription medicine to help  prevent HIV infection.  What else can I do?  Schedule regular health, dental, and eye exams.  Stay current with your vaccines (immunizations).  Do not use any tobacco products, such as cigarettes, chewing tobacco, and e-cigarettes. If you need help quitting, ask your health care provider.  Limit alcohol intake to no more than 2 drinks per day. One drink equals 12 ounces of beer, 5 ounces of wine, or 1 ounces of hard liquor.  Do not use street drugs.  Do not share needles.  Ask your health care provider for help if you need support or information about quitting drugs.  Tell your health care provider if you often feel depressed.  Tell your health care provider if you have ever been abused or do not feel safe at home. This information is not intended to replace advice given to you by your health care provider. Make sure you discuss any questions you have with your  health care provider. Document Released: 06/01/2008 Document Revised: 08/02/2016 Document Reviewed: 09/07/2015 Elsevier Interactive Patient Education  [employer].

## 2017-07-18 NOTE — Assessment & Plan Note (Signed)
Chronic problem.  Currently asymptomatic.  On Allopurinol daily as a preventative.  Refill provided.  Will follow.

## 2017-07-18 NOTE — Progress Notes (Unsigned)
Subjective:   [name] is a 71 y.o. male who presents for an Initial Medicare Annual Wellness Visit.  Review of Systems  No ROS.  Medicare Wellness Visit. Additional risk factors are reflected in the social history.    Sleep patterns: Home Safety/Smoke Alarms: Feels safe in home. Smoke alarms in place.  Living environment; residence and Firearm Safety:  [place] Safety/Bike Helmet: Wears seat belt.   Counseling:   Eye Exam-  Dental-  Male:   CCS-Colonoscopy 07/30/2015,      PSA- No results found for: PSA     Objective:    There were no vitals filed for this visit. There is no height or weight on file to calculate BMI.  Current Medications (verified) Outpatient Encounter Prescriptions as of 07/18/2017  Medication Sig  . allopurinol (ZYLOPRIM) 100 MG tablet   . aspirin EC 81 MG tablet Take 81 mg by mouth daily.  Marland Kitchen COLCRYS 0.6 MG tablet Take 0.6 mg by mouth daily as needed.  . diclofenac (VOLTAREN) 75 MG EC tablet Take 1 tablet (75 mg total) by mouth 2 (two) times daily as needed.  Marland Kitchen EPIPEN 2-PAK 0.3 MG/0.3ML SOAJ injection   . gabapentin (NEURONTIN) 300 MG capsule Take 300 mg by mouth 3 (three) times daily.   Marland Kitchen HYDROcodone-acetaminophen (NORCO/VICODIN) 5-325 MG per tablet Take 1 tablet by mouth as needed.  Marland Kitchen lisinopril-hydrochlorothiazide (PRINZIDE,ZESTORETIC) 10-12.5 MG tablet TAKE 1 TABLET BY MOUTH DAILY.  . metoprolol succinate (TOPROL-XL) 25 MG 24 hr tablet Take 1 tablet (25 mg total) by mouth daily.  . nabumetone (RELAFEN) 500 MG tablet Take 500 mg by mouth 2 (two) times daily as needed.  . simvastatin (ZOCOR) 40 MG tablet Take 1 tablet (40 mg total) by mouth daily.   No facility-administered encounter medications on file as of 07/18/2017.     Allergies (verified) Bee venom   History: Past Medical History:  Diagnosis Date  . Cellulitis and abscess of other specified site   . Colon polyps   . Hypertension    Past Surgical History:  Procedure  Laterality Date  . BACK SURGERY    . COLONOSCOPY  01/10/2013   Procedure: COLONOSCOPY;  Surgeon: Rogene Houston, MD;  [facility];  Service: Endoscopy;  Laterality: N/A;  730  . COLONOSCOPY N/A 08/21/2013   Procedure: COLONOSCOPY;  Surgeon: Rogene Houston, MD;  [facility];  Service: Endoscopy;  Laterality: N/A;  1030  . COLONOSCOPY N/A 08/26/2015   Procedure: COLONOSCOPY;  Surgeon: Rogene Houston, MD;  [facility];  Service: Endoscopy;  Laterality: N/A;  240  . colonscopy     Family History  Problem Relation Age of Onset  . Cancer Other        Family Hx of Cancer, CAD,Diabetes,Kidney Failure  . Colon cancer Neg Hx    Social History   Occupational History  . 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 Smoking status: Former Smoker    Packs/day: 3.00    Years: 30.00    Types: Cigarettes    Start date: 12/18/1957    Quit date: 12/18/1996  . Smokeless tobacco: Never Used     Comment: started smoking as a child  . Alcohol use 0.0 oz/week     Comment: beer  . Drug use: No  . Sexual activity: Not on file   Tobacco Counseling Counseling given: Not Answered   Activities of Daily Living In your present state of health, do  you have any difficulty performing the following activities: 01/19/2017  Hearing? N  Vision? N  Difficulty concentrating or making decisions? N  Walking or climbing stairs? N  Dressing or bathing? N  Doing errands, shopping? N  Some recent data might be hidden    Immunizations and Health Maintenance Immunization History  Administered Date(s) Administered  . Tdap 07/30/2007  . Zoster 01/19/2015   Health Maintenance Due  Topic Date Due  . HEMOGLOBIN A1C  1946-06-29  . FOOT EXAM  11/29/1958  . OPHTHALMOLOGY EXAM  11/29/1958  . PNA vac Low Risk Adult (1 of 2 - PCV13) 11/29/2013    Patient Care Team: Midge Minium, MD as PCP - General (Family Medicine) Jovita Gamma, MD as Consulting Physician  (Neurosurgery) Herminio Commons, MD as Attending Physician (Cardiology) Rogene Houston, MD as Consulting Physician (Gastroenterology)  Indicate any recent Medical Services you may have received from other than [facility] providers in the past year (date may be approximate).    Assessment:   This is a routine wellness examination for [name]. Physical assessment deferred to PCP.   Hearing/Vision screen No exam data present  Dietary issues and exercise activities discussed:   Diet (meal preparation, eat out, water intake, caffeinated beverages, dairy products, fruits and vegetables):   Breakfast: Lunch:  Dinner:      Goals    None     Depression Screen PHQ 2/9 Scores 01/19/2017  PHQ - 2 Score 0  PHQ- 9 Score 0    Fall Risk Fall Risk  01/19/2017  Falls in the past year? No    Cognitive Function:        Screening Tests Health Maintenance  Topic Date Due  . HEMOGLOBIN A1C  05/08/1946  . FOOT EXAM  11/29/1958  . OPHTHALMOLOGY EXAM  11/29/1958  . PNA vac Low Risk Adult (1 of 2 - PCV13) 11/29/2013  . INFLUENZA VACCINE  09/17/2017 (Originally 07/18/2017)  . Hepatitis C Screening  09/17/2017 (Originally February 29, 1946)  . TETANUS/TDAP  07/29/2017  . COLONOSCOPY  08/25/2025        Plan:     I have personally reviewed and noted the following in the patient's chart:   . Medical and social history . Use of alcohol, tobacco or illicit drugs  . Current medications and supplements . Functional ability and status . Nutritional status . Physical activity . Advanced directives . List of other physicians . Hospitalizations, surgeries, and ER visits in previous 12 months . Vitals . Screenings to include cognitive, depression, and falls . Referrals and appointments  In addition, I have reviewed and discussed with patient certain preventive protocols, quality metrics, and best practice recommendations. A written personalized care plan for preventive services as well as general  preventive health recommendations were provided to patient.     Gerilyn Nestle, RN   07/18/2017

## 2017-07-31 DIAGNOSIS — M545 Low back pain: Secondary | ICD-10-CM | POA: Diagnosis not present

## 2017-07-31 DIAGNOSIS — M4156 Other secondary scoliosis, lumbar region: Secondary | ICD-10-CM | POA: Diagnosis not present

## 2017-07-31 DIAGNOSIS — M5416 Radiculopathy, lumbar region: Secondary | ICD-10-CM | POA: Diagnosis not present

## 2017-07-31 DIAGNOSIS — M5136 Other intervertebral disc degeneration, lumbar region: Secondary | ICD-10-CM | POA: Diagnosis not present

## 2017-07-31 DIAGNOSIS — M48062 Spinal stenosis, lumbar region with neurogenic claudication: Secondary | ICD-10-CM | POA: Diagnosis not present

## 2017-07-31 DIAGNOSIS — Z6827 Body mass index (BMI) 27.0-27.9, adult: Secondary | ICD-10-CM | POA: Diagnosis not present

## 2017-07-31 DIAGNOSIS — M4726 Other spondylosis with radiculopathy, lumbar region: Secondary | ICD-10-CM | POA: Diagnosis not present

## 2017-08-15 ENCOUNTER — Ambulatory Visit (INDEPENDENT_AMBULATORY_CARE_PROVIDER_SITE_OTHER): Payer: Medicare Other | Admitting: Family Medicine

## 2017-08-15 ENCOUNTER — Encounter: Payer: Self-pay | Admitting: Family Medicine

## 2017-08-15 VITALS — BP 121/68 | HR 65 | Temp 98.1°F | Resp 17 | Ht 72.0 in | Wt 208.1 lb

## 2017-08-15 DIAGNOSIS — M25562 Pain in left knee: Secondary | ICD-10-CM | POA: Diagnosis not present

## 2017-08-15 NOTE — Progress Notes (Signed)
   Subjective:    Patient ID: PEYSON POSTEMA, male    DOB: 30-Dec-1945, 71 y.o.   MRN: [id number]  HPI Knee pain- L knee, sxs started ~1 week ago.  No known injury.  Pt reports he will have 'popping' w/ walking.  Pain is anteriorly.  Pt has had swelling anterior-medially and swelling posteriorly.  Pt taking Diclofenac w/ some relief.   Review of Systems For ROS see HPI     Objective:   Physical Exam  Constitutional: He is oriented to person, place, and time. He appears well-developed and well-nourished. No distress.  Cardiovascular: Intact distal pulses.   Musculoskeletal: He exhibits edema (mild swelling of anteromedial L knee). He exhibits no tenderness (no TTP over medial or lateral jointline of L knee, no TTP over patellar tendon) or deformity.  + crepitus w/ knee flexion/extension No pain w/ palpitation but pain w/ weight bearing  Neurological: He is alert and oriented to person, place, and time.  Skin: Skin is warm and dry. No rash noted. No erythema.  Vitals reviewed.         Assessment & Plan:  L knee pain- new.  No known injury.  Pain only occurs w/ weight bearing.  On Diclofenac twice daily- no need to change NSAIDs.  Start icing regularly.  Refer to ortho for complete evaluation.  Reviewed supportive care and red flags that should prompt return.  Pt expressed understanding and is in agreement w/ plan.

## 2017-08-15 NOTE — Patient Instructions (Signed)
Follow up as needed or as scheduled Continue the Diclofenac twice daily w/ food Add Tylenol (Acetaminophen) as needed for breakthrough pain We'll call you with your ortho appt ICE! Call with any questions or concerns Hang in there!!

## 2017-08-15 NOTE — Progress Notes (Signed)
Pre visit review using our clinic review tool, if applicable. No additional management support is needed unless otherwise documented below in the visit note. 

## 2017-08-24 DIAGNOSIS — M25562 Pain in left knee: Secondary | ICD-10-CM | POA: Diagnosis not present

## 2017-09-05 DIAGNOSIS — M1712 Unilateral primary osteoarthritis, left knee: Secondary | ICD-10-CM | POA: Diagnosis not present

## 2017-09-06 ENCOUNTER — Telehealth: Payer: Self-pay | Admitting: *Deleted

## 2017-09-06 MED ORDER — METOPROLOL SUCCINATE ER 25 MG PO TB24: 25.0000 mg | ORAL_TABLET | Freq: Every day | ORAL | 1 refills | Status: DC

## 2017-09-06 NOTE — Telephone Encounter (Signed)
Patient called in for medication refill.  This issue was handled and medication was filled.

## 2017-09-12 DIAGNOSIS — M1712 Unilateral primary osteoarthritis, left knee: Secondary | ICD-10-CM | POA: Diagnosis not present

## 2017-09-19 DIAGNOSIS — M1712 Unilateral primary osteoarthritis, left knee: Secondary | ICD-10-CM | POA: Diagnosis not present

## 2017-10-01 DIAGNOSIS — M1712 Unilateral primary osteoarthritis, left knee: Secondary | ICD-10-CM | POA: Diagnosis not present

## 2017-10-08 ENCOUNTER — Other Ambulatory Visit: Payer: Self-pay | Admitting: Family Medicine

## 2017-10-22 DIAGNOSIS — M1712 Unilateral primary osteoarthritis, left knee: Secondary | ICD-10-CM | POA: Diagnosis present

## 2017-10-22 DIAGNOSIS — M19072 Primary osteoarthritis, left ankle and foot: Secondary | ICD-10-CM | POA: Diagnosis not present

## 2017-10-22 NOTE — H&P (Signed)
PREOPERATIVE H&P Patient ID: CAYLOR TALLARICO MRN: [id number] DOB/AGE: [birth year] 71 y.o.  Chief Complaint: OA LEFT KNEE  Planned Procedure Date: 11/06/2017  HPI: [name] is a 71 y.o. male with a history of HTN, HLD, low back pain, and gout affecting hands and feet who presents for evaluation of OA LEFT KNEE. The patient has a history of pain and functional disability in the left knee due to arthritis and has failed non-surgical conservative treatments for greater than 12 weeks to include NSAID's and/or analgesics, corticosteriod injections and activity modification.  Onset of symptoms was gradual, starting 1 years ago with gradually worsening course since that time.  Patient currently rates pain at 8 out of 10 with activity. Patient has night pain, worsening of pain with activity and weight bearing and pain that interferes with activities of daily living.  Patient has evidence of periarticular osteophytes and joint space narrowing by imaging studies. There is no active infection.  Past Medical History:  Diagnosis Date  . Cellulitis and abscess of other specified site   . Colon polyps   . Hypertension    Past Surgical History:  Procedure Laterality Date  . BACK SURGERY    . colonscopy     Allergies  Allergen Reactions  . Bee Venom Anaphylaxis   Prior to Admission medications   Medication Sig Start Date End Date Taking? Authorizing Provider  allopurinol (ZYLOPRIM) 100 MG tablet Take 1 tablet (100 mg total) by mouth daily. 07/18/17  Yes Midge Minium, MD  aspirin EC 81 MG tablet Take 81 mg by mouth daily.   Yes [provider]  diclofenac (VOLTAREN) 75 MG EC tablet TAKE 1 TABLET (75 MG TOTAL) BY MOUTH 2 (TWO) TIMES DAILY AS NEEDED. Patient taking differently: Take 75 mg by mouth 2 (two) times daily as needed for moderate pain.  10/08/17  Yes Midge Minium, MD  EPIPEN 2-PAK 0.3 MG/0.3ML SOAJ injection Inject 0.3 mg into the muscle once.  08/11/14  Yes [provider]  gabapentin (NEURONTIN) 300 MG capsule Take 300 mg by mouth 3 (three) times daily.    Yes [provider]  HYDROcodone-acetaminophen (NORCO/VICODIN) 5-325 MG per tablet Take 1 tablet by mouth every 6 (six) hours as needed for moderate pain.  07/01/14  Yes [provider]  lisinopril-hydrochlorothiazide (PRINZIDE,ZESTORETIC) 10-12.5 MG tablet TAKE 1 TABLET BY MOUTH DAILY. 02/16/17  Yes Herminio Commons, MD  metoprolol succinate (TOPROL-XL) 25 MG 24 hr tablet Take 1 tablet (25 mg total) by mouth daily. 09/06/17  Yes Midge Minium, MD  simvastatin (ZOCOR) 40 MG tablet Take 1 tablet (40 mg total) by mouth daily. 09/20/16  Yes Herminio Commons, MD   Social History   Socioeconomic History  . Marital status: Married    Spouse name: Not on file  . Number of children: Not on file  . Years of education: Not on file  . Highest education level: Not on file  Social Needs  . Financial resource strain: Not on file  . Food insecurity - worry: Not on file  . Food insecurity - inability: Not on file  . Transportation needs - medical: Not on file  . Transportation needs - non-medical: Not on file  Occupational History  . Occupation: PLUMBING  Tobacco Use  . Smoking status: Former Smoker    Packs/day: 3.00    Years: 30.00    Pack years: 90.00    Types: Cigarettes    Start date: 12/18/1957  Last attempt to quit: 12/18/1996    Years since quitting: 20.8  . Smokeless tobacco: Never Used  . Tobacco comment: started smoking as a child  Substance and Sexual Activity  . Alcohol use: Yes    Alcohol/week: 0.0 oz    Comment: beer  . Drug use: No  . Sexual activity: Not on file  Other Topics Concern  . Not on file  Social History Narrative  . Not on file   Family History  Problem Relation Age of Onset  . Cancer Other        Family Hx of Cancer, CAD,Diabetes,Kidney Failure  . Colon cancer Neg Hx     ROS: Currently denies lightheadedness, dizziness, Fever,  chills, CP, SOB.   No personal history of DVT, PE, MI, or CVA. He has dentures All other systems have been reviewed and were otherwise currently negative with the exception of those mentioned in the HPI and as above.  Objective: Vitals: Ht: 5 feet 10 inches wt: 209 temp: 97.8 BP: 102 /61 pulse: 50 O2 96% on room air. Physical Exam: General: Alert, NAD.  Antalgic Gait  HEENT: EOMI, Good Neck Extension  Pulm: No increased work of breathing.  Clear B/L A/P w/o crackle or wheeze.  CV: RRR, No m/g/r appreciated  GI: soft, NT, ND Neuro: Neuro without gross focal deficit.  Sensation intact distally Skin: No lesions in the area of chief complaint MSK/Surgical Site: Left knee w/o redness or effusion.  Positive medial JLT. ROM 0-115.  5/5 strength in extension and flexion.  +EHL/FHL.  NVI.  Stable varus and valgus stress.    Imaging Review Plain radiographs demonstrate severe degenerative joint disease of the left knee.   Assessment: OA LEFT KNEE Principal Problem:   Primary osteoarthritis of left knee Active Problems:   Essential hypertension   Hyperlipidemia   Gout   Plan: Plan for Procedure(s): UNICOMPARTMENTAL KNEE  The patient history, physical exam, clinical judgement of the provider and imaging are consistent with end stage degenerative joint disease and unicompartmental joint arthroplasty is deemed medically necessary. The treatment options including medical management, injection therapy, and arthroplasty were discussed at length. The risks and benefits of Procedure(s): UNICOMPARTMENTAL KNEE were presented and reviewed.  The risks of nonoperative treatment, versus surgical intervention including but not limited to continued pain, aseptic loosening, stiffness, dislocation/subluxation, infection, bleeding, nerve injury, blood clots, cardiopulmonary complications, morbidity, mortality, among others were discussed. The patient verbalizes understanding and wishes to proceed with the  plan.  Patient is being admitted for inpatient treatment for surgery, pain control, PT, OT, prophylactic antibiotics, VTE prophylaxis, progressive ambulation, ADL's and discharge planning.   Dental prophylaxis discussed and recommended for 2 years postoperatively.   The patient does meet the criteria for TXA which will be used perioperatively via IV.    ASA 325 mg will be used postoperatively for DVT prophylaxis in addition to SCDs, and early ambulation.  The patient is planning to be discharged home with home health services in care of his wife.  Prudencio Burly III, PA-C 10/22/2017 5:55 PM

## 2017-10-25 NOTE — Pre-Procedure Instructions (Signed)
[name]  10/25/2017      [id number] [place], [place] - [place] [address] Phone: [phone] Fax: [phone]    Your procedure is scheduled on November 20  Report to [facility] Admitting at 0800 A.M.  Call this number if you have problems the morning of surgery:  [phone]   Remember:  Do not eat food or drink liquids after midnight.  Continue all other medications as directed by your physician except follow these medication instructions before surgery    Take these medicines the morning of surgery with A SIP OF WATER allopurinol (ZYLOPRIM)  gabapentin (NEURONTIN) HYDROcodone-acetaminophen (NORCO/VICODIN metoprolol succinate (TOPROL-XL)   7 days prior to surgery STOP taking any Aspirin (unless otherwise instructed by your surgeon), Aleve, Naproxen, Ibuprofen, Motrin, Advil, Goody's, BC's, all herbal medications, fish oil, and all vitamins diclofenac (VOLTAREN)    Do not wear jewelry  Do not wear lotions, powders, or cologne, or deoderant.  Men may shave face and neck.  Do not bring valuables to the hospital.  [facility] is not responsible for any belongings or valuables.  Contacts, dentures or bridgework may not be worn into surgery.  Leave your suitcase in the car.  After surgery it may be brought to your room.  For patients admitted to the hospital, discharge time will be determined by your treatment team.  Patients discharged the day of surgery will not be allowed to drive home.    Special instructions:   [place]- Preparing For Surgery  Before surgery, you can play an important role. Because skin is not sterile, your skin needs to be as free of germs as possible. You can reduce the number of germs on your skin by washing with CHG (chlorahexidine gluconate) Soap before surgery.  CHG is an antiseptic cleaner which kills germs and bonds with the skin to continue killing germs even  after washing.  Please do not use if you have an allergy to CHG or antibacterial soaps. If your skin becomes reddened/irritated stop using the CHG.  Do not shave (including legs and underarms) for at least 48 hours prior to first CHG shower. It is OK to shave your face.  Please follow these instructions carefully.   1. Shower the NIGHT BEFORE SURGERY and the MORNING OF SURGERY with CHG.   2. If you chose to wash your hair, wash your hair first as usual with your normal shampoo.  3. After you shampoo, rinse your hair and body thoroughly to remove the shampoo.  4. Use CHG as you would any other liquid soap. You can apply CHG directly to the skin and wash gently with a scrungie or a clean washcloth.   5. Apply the CHG Soap to your body ONLY FROM THE NECK DOWN.  Do not use on open wounds or open sores. Avoid contact with your eyes, ears, mouth and genitals (private parts). Wash Face and genitals (private parts)  with your normal soap.  6. Wash thoroughly, paying special attention to the area where your surgery will be performed.  7. Thoroughly rinse your body with warm water from the neck down.  8. DO NOT shower/wash with your normal soap after using and rinsing off the CHG Soap.  9. Pat yourself dry with a CLEAN TOWEL.  10. Wear CLEAN PAJAMAS to bed the night before surgery, wear comfortable clothes the morning of surgery  11. Place CLEAN SHEETS on your bed the night  of your first shower and DO NOT SLEEP WITH PETS.    Day of Surgery: Do not apply any deodorants/lotions. Please wear clean clothes to the hospital/surgery center.      Please read over the following fact sheets that you were given.

## 2017-10-26 ENCOUNTER — Other Ambulatory Visit: Payer: Self-pay

## 2017-10-26 ENCOUNTER — Encounter (HOSPITAL_COMMUNITY)
Admission: RE | Admit: 2017-10-26 | Discharge: 2017-10-26 | Disposition: A | Discharge status: Home / Self Care | Payer: Medicare Other | Source: Ambulatory Visit | Attending: Orthopedic Surgery | Admitting: Orthopedic Surgery

## 2017-10-26 ENCOUNTER — Encounter (HOSPITAL_COMMUNITY): Payer: Self-pay

## 2017-10-26 DIAGNOSIS — Z7982 Long term (current) use of aspirin: Secondary | ICD-10-CM | POA: Diagnosis not present

## 2017-10-26 DIAGNOSIS — Z01812 Encounter for preprocedural laboratory examination: Secondary | ICD-10-CM | POA: Insufficient documentation

## 2017-10-26 DIAGNOSIS — Z79899 Other long term (current) drug therapy: Secondary | ICD-10-CM | POA: Diagnosis not present

## 2017-10-26 DIAGNOSIS — Z01810 Encounter for preprocedural cardiovascular examination: Secondary | ICD-10-CM | POA: Diagnosis not present

## 2017-10-26 DIAGNOSIS — I10 Essential (primary) hypertension: Secondary | ICD-10-CM | POA: Insufficient documentation

## 2017-10-26 DIAGNOSIS — Z87891 Personal history of nicotine dependence: Secondary | ICD-10-CM | POA: Diagnosis not present

## 2017-10-26 HISTORY — DX: Scoliosis, unspecified: M41.9

## 2017-10-26 HISTORY — DX: Gout, unspecified: M10.9

## 2017-10-26 HISTORY — DX: Myoneural disorder, unspecified: G70.9

## 2017-10-26 LAB — CBC
HCT: 42.9 % (ref 39.0–52.0)
Hemoglobin: 14.1 g/dL (ref 13.0–17.0)
MCH: 28.7 pg (ref 26.0–34.0)
MCHC: 32.9 g/dL (ref 30.0–36.0)
MCV: 87.2 fL (ref 78.0–100.0)
PLATELETS: 182 10*3/uL (ref 150–400)
RBC: 4.92 MIL/uL (ref 4.22–5.81)
RDW: 14.2 % (ref 11.5–15.5)
WBC: 7.5 10*3/uL (ref 4.0–10.5)

## 2017-10-26 LAB — BASIC METABOLIC PANEL
Anion gap: 8 (ref 5–15)
BUN: 27 mg/dL — ABNORMAL HIGH (ref 6–20)
CALCIUM: 9.4 mg/dL (ref 8.9–10.3)
CO2: 26 mmol/L (ref 22–32)
CREATININE: 1.36 mg/dL — AB (ref 0.61–1.24)
Chloride: 104 mmol/L (ref 101–111)
GFR, EST NON AFRICAN AMERICAN: 52 mL/min — AB (ref >60)
Glucose, Bld: 89 mg/dL (ref 65–99)
Potassium: 4.2 mmol/L (ref 3.5–5.1)
SODIUM: 138 mmol/L (ref 135–145)

## 2017-10-26 LAB — SURGICAL PCR SCREEN
MRSA, PCR: NEGATIVE
STAPHYLOCOCCUS AUREUS: POSITIVE — AB

## 2017-10-26 NOTE — Progress Notes (Signed)
Patient notified of PCR result, [facility]

## 2017-10-26 NOTE — Progress Notes (Signed)
PCP - [name] Cardiologist - Dr. Bronson Ing  Chest x-ray - not needed EKG - 10/26/17 Stress Test - 2015 ECHO - 2015 Cardiac Cath - denies  Sending to anesthesia for review of heart history   Patient denies shortness of breath, fever, cough and chest pain at PAT appointment   Patient verbalized understanding of instructions that were given to them at the PAT appointment. Patient was also instructed that they will need to review over the PAT instructions again at home before surgery.

## 2017-10-29 ENCOUNTER — Encounter: Payer: Self-pay | Admitting: Family Medicine

## 2017-10-29 ENCOUNTER — Ambulatory Visit (INDEPENDENT_AMBULATORY_CARE_PROVIDER_SITE_OTHER): Payer: Medicare Other | Admitting: Family Medicine

## 2017-10-29 ENCOUNTER — Encounter: Payer: Self-pay | Admitting: General Practice

## 2017-10-29 ENCOUNTER — Other Ambulatory Visit: Payer: Self-pay

## 2017-10-29 VITALS — BP 122/87 | HR 79 | Temp 98.1°F | Resp 16 | Ht 72.0 in | Wt 206.5 lb

## 2017-10-29 DIAGNOSIS — Z01818 Encounter for other preprocedural examination: Secondary | ICD-10-CM | POA: Diagnosis not present

## 2017-10-29 LAB — BASIC METABOLIC PANEL
BUN: 23 mg/dL (ref 6–23)
CALCIUM: 9.9 mg/dL (ref 8.4–10.5)
CO2: 29 meq/L (ref 19–32)
Chloride: 102 mEq/L (ref 96–112)
Creatinine, Ser: 1.15 mg/dL (ref 0.40–1.50)
GFR: 67.03 mL/min (ref >60.00)
GLUCOSE: 84 mg/dL (ref 70–99)
POTASSIUM: 4.9 meq/L (ref 3.5–5.1)
SODIUM: 141 meq/L (ref 135–145)

## 2017-10-29 NOTE — Patient Instructions (Signed)
Follow up as needed or as scheduled We'll notify you of your lab results and make any changes if needed EKG looks good! Try Tums or other acid reducer to see if your symptoms improve I will fax in your surgical clearance form today Call with any questions or concerns GOOD LUCK!!!

## 2017-10-29 NOTE — Progress Notes (Signed)
   Subjective:    Patient ID: [name], male    DOB: 1946-04-16, 71 y.o.   MRN: [id number]  HPI Surgical Clearance- pt is here today for surgical for L partial knee replacement on Nov 20.  He had pre-op testing at the hospital and Cr was mildly elevated at 1.36.  EKG showed some ST segment elevation.  He reports some burning in his chest since Friday but denies pain.  BP is well controlled today on Metoprolol and Lisinopril HCTZ.  Denies CP, SOB, HAs, abd pain, N/V.  No hx of anesthesia complications.   Review of Systems For ROS see HPI     Objective:   Physical Exam  Constitutional: He is oriented to person, place, and time. He appears well-developed and well-nourished. No distress.  HENT:  Head: Normocephalic and atraumatic.  Eyes: Conjunctivae and EOM are normal. Pupils are equal, round, and reactive to light.  Neck: Normal range of motion. Neck supple. No thyromegaly present.  Cardiovascular: Normal rate, regular rhythm, normal heart sounds and intact distal pulses.  No murmur heard. Pulmonary/Chest: Effort normal and breath sounds normal. No respiratory distress.  Abdominal: Soft. Bowel sounds are normal. He exhibits no distension.  Musculoskeletal: He exhibits no edema.  Lymphadenopathy:    He has no cervical adenopathy.  Neurological: He is alert and oriented to person, place, and time. No cranial nerve deficit.  Skin: Skin is warm and dry.  Psychiatric: He has a normal mood and affect. His behavior is normal.  Vitals reviewed.         Assessment & Plan:  Pre-op evaluation- pt has stopped all NSAIDS and is scheduled for partial knee replacement.  Reviewed EKG done at hospital and recent labs.  Due to chest burning (most likely reflux) repeated EKG out of an abundance of caution and it was unchanged from October 2017.  Recommended OTC acid reducers.  Repeat BMP as Cr was mildly elevated at 1.36.  This is not high enough to hold surgery but worth trending.  Surgical  clearance form completed and faxed.

## 2017-10-30 NOTE — Progress Notes (Signed)
Anesthesia Chart Review:  Pt is a 71 year old male scheduled for L unicompartmental knee on 11/06/2017 with Edmonia Lynch, MD  - PCP is [name], MD who cleared pt for surgery at last office visit 10/29/17.   - Saw cardiologist Kate Sable, MD in [phone] for chest pain. Stress test and echo ordered, results below. F/u prn recommended.   PMH includes:  HTN. Former smoker. BMI 28  Medications include: ASA 81mg , lisinopril-hctz, metoprolol, simvastatin  BP (!) 106/59   Pulse 63   Temp 36.6 C   Resp 20   Ht 5\' 11"  (1.803 m)   Wt 207 lb 11.2 oz (94.2 kg)   SpO2 99%   BMI 28.97 kg/m   Preoperative labs reviewed.    EKG 10/29/17: Sinus  Bradycardia (58 bpm).   Echo 09/09/14:  - Left ventricle: The cavity size was normal. Wall thickness wasincreased in a pattern of mild LVH. Systolic function was normal.The estimated ejection fraction was in the range of 60% to 65%.Diastolic function is abnormal, indeterminant grade. Wall motionwas normal; there were no regional wall motion abnormalities. - Left atrium: The atrium was mildly dilated. - Atrial septum: No defect or patent foramen ovale was identified. - Pulmonary arteries: PA peak pressure: 32 mm Hg (S). - Technically adequate study.  Nuclear stress test 09/09/14:  1. No reversible ischemia or infarction. 2. Normal left ventricular wall motion. 3. Left ventricular ejection fraction 68% 4.  Low risk stress test findings  If no changes, I anticipate pt can proceed with surgery as scheduled.   Willeen Cass, FNP-BC [facility]/Anesthesiology Phone: [phone] 10/30/2017 2:58 PM

## 2017-11-05 MED ORDER — ACETAMINOPHEN 500 MG PO TABS
1000.0000 mg | ORAL_TABLET | Freq: Once | ORAL | Status: AC
Start: 2017-11-06 — End: 2017-11-06
  Administered 2017-11-06: 1000 mg via ORAL
  Filled 2017-11-05: qty 2

## 2017-11-05 MED ORDER — CEFAZOLIN SODIUM-DEXTROSE 2-4 GM/100ML-% IV SOLN
2.0000 g | INTRAVENOUS | Status: AC
  Administered 2017-11-06: 2 g via INTRAVENOUS
  Filled 2017-11-05: qty 100

## 2017-11-05 MED ORDER — TRANEXAMIC ACID 1000 MG/10ML IV SOLN
1000.0000 mg | INTRAVENOUS | Status: AC
  Administered 2017-11-06: 1000 mg via INTRAVENOUS
  Filled 2017-11-05: qty 10

## 2017-11-05 MED ORDER — GABAPENTIN 300 MG PO CAPS
300.0000 mg | ORAL_CAPSULE | Freq: Once | ORAL | Status: AC
Start: 2017-11-06 — End: 2017-11-06
  Administered 2017-11-06: 300 mg via ORAL
  Filled 2017-11-05: qty 1

## 2017-11-06 ENCOUNTER — Encounter (HOSPITAL_COMMUNITY): Payer: Self-pay

## 2017-11-06 ENCOUNTER — Ambulatory Visit (HOSPITAL_COMMUNITY): Payer: Medicare Other | Admitting: Certified Registered"

## 2017-11-06 ENCOUNTER — Encounter (HOSPITAL_COMMUNITY)
Admission: RE | Disposition: A | Discharge status: Home / Self Care | Payer: Self-pay | Source: Ambulatory Visit | Attending: Orthopedic Surgery

## 2017-11-06 ENCOUNTER — Ambulatory Visit (HOSPITAL_COMMUNITY): Payer: Medicare Other | Admitting: Emergency Medicine

## 2017-11-06 ENCOUNTER — Observation Stay (HOSPITAL_COMMUNITY)
Admission: RE | Admit: 2017-11-06 | Discharge: 2017-11-07 | Disposition: A | Discharge status: Home / Self Care | Payer: Medicare Other | Source: Ambulatory Visit | Attending: Orthopedic Surgery | Admitting: Orthopedic Surgery

## 2017-11-06 ENCOUNTER — Ambulatory Visit (HOSPITAL_COMMUNITY): Payer: Medicare Other

## 2017-11-06 ENCOUNTER — Other Ambulatory Visit: Payer: Self-pay

## 2017-11-06 DIAGNOSIS — Z79899 Other long term (current) drug therapy: Secondary | ICD-10-CM | POA: Insufficient documentation

## 2017-11-06 DIAGNOSIS — Z7982 Long term (current) use of aspirin: Secondary | ICD-10-CM | POA: Insufficient documentation

## 2017-11-06 DIAGNOSIS — M1712 Unilateral primary osteoarthritis, left knee: Principal | ICD-10-CM | POA: Insufficient documentation

## 2017-11-06 DIAGNOSIS — M419 Scoliosis, unspecified: Secondary | ICD-10-CM | POA: Insufficient documentation

## 2017-11-06 DIAGNOSIS — Z8249 Family history of ischemic heart disease and other diseases of the circulatory system: Secondary | ICD-10-CM | POA: Diagnosis not present

## 2017-11-06 DIAGNOSIS — M109 Gout, unspecified: Secondary | ICD-10-CM | POA: Diagnosis present

## 2017-11-06 DIAGNOSIS — Z0389 Encounter for observation for other suspected diseases and conditions ruled out: Secondary | ICD-10-CM

## 2017-11-06 DIAGNOSIS — I10 Essential (primary) hypertension: Secondary | ICD-10-CM | POA: Insufficient documentation

## 2017-11-06 DIAGNOSIS — Z86010 Personal history of colonic polyps: Secondary | ICD-10-CM | POA: Diagnosis not present

## 2017-11-06 DIAGNOSIS — E785 Hyperlipidemia, unspecified: Secondary | ICD-10-CM | POA: Insufficient documentation

## 2017-11-06 DIAGNOSIS — Z87891 Personal history of nicotine dependence: Secondary | ICD-10-CM | POA: Insufficient documentation

## 2017-11-06 DIAGNOSIS — Z91030 Bee allergy status: Secondary | ICD-10-CM | POA: Diagnosis not present

## 2017-11-06 DIAGNOSIS — M25562 Pain in left knee: Secondary | ICD-10-CM | POA: Diagnosis not present

## 2017-11-06 DIAGNOSIS — M25762 Osteophyte, left knee: Secondary | ICD-10-CM | POA: Insufficient documentation

## 2017-11-06 DIAGNOSIS — M1A9XX0 Chronic gout, unspecified, without tophus (tophi): Secondary | ICD-10-CM

## 2017-11-06 DIAGNOSIS — G8918 Other acute postprocedural pain: Secondary | ICD-10-CM | POA: Diagnosis not present

## 2017-11-06 HISTORY — PX: PARTIAL KNEE ARTHROPLASTY: SHX2174

## 2017-11-06 SURGERY — ARTHROPLASTY, KNEE, UNICOMPARTMENTAL
Anesthesia: Spinal | Site: Knee | Laterality: Left

## 2017-11-06 MED ORDER — CEFAZOLIN SODIUM-DEXTROSE 1-4 GM/50ML-% IV SOLN
1.0000 g | Freq: Four times a day (QID) | INTRAVENOUS | Status: AC
  Administered 2017-11-06 – 2017-11-07 (×2): 1 g via INTRAVENOUS
  Filled 2017-11-06 (×3): qty 50

## 2017-11-06 MED ORDER — METOCLOPRAMIDE HCL 5 MG PO TABS: 5.0000 mg | ORAL_TABLET | Freq: Three times a day (TID) | ORAL | Status: DC | PRN

## 2017-11-06 MED ORDER — CELECOXIB 200 MG PO CAPS
200.0000 mg | ORAL_CAPSULE | Freq: Two times a day (BID) | ORAL | Status: DC
  Administered 2017-11-06 – 2017-11-07 (×2): 200 mg via ORAL
  Filled 2017-11-06 (×2): qty 1

## 2017-11-06 MED ORDER — EPHEDRINE SULFATE 50 MG/ML IJ SOLN
INTRAMUSCULAR | Status: DC | PRN
  Administered 2017-11-06: 20 mg via INTRAVENOUS
  Administered 2017-11-06: 5 mg via INTRAVENOUS

## 2017-11-06 MED ORDER — DEXAMETHASONE SODIUM PHOSPHATE 10 MG/ML IJ SOLN
INTRAMUSCULAR | Status: AC
  Filled 2017-11-06: qty 1

## 2017-11-06 MED ORDER — ONDANSETRON HCL 4 MG/2ML IJ SOLN
INTRAMUSCULAR | Status: AC
  Filled 2017-11-06: qty 2

## 2017-11-06 MED ORDER — HYDROCHLOROTHIAZIDE 12.5 MG PO CAPS
12.5000 mg | ORAL_CAPSULE | Freq: Every day | ORAL | Status: DC
  Administered 2017-11-06 – 2017-11-07 (×2): 12.5 mg via ORAL
  Filled 2017-11-06 (×2): qty 1

## 2017-11-06 MED ORDER — DOCUSATE SODIUM 100 MG PO CAPS: 100.0000 mg | ORAL_CAPSULE | Freq: Two times a day (BID) | ORAL | 0 refills | Status: DC

## 2017-11-06 MED ORDER — PHENOL 1.4 % MT LIQD: 1.0000 | OROMUCOSAL | Status: DC | PRN

## 2017-11-06 MED ORDER — BUPIVACAINE HCL (PF) 0.25 % IJ SOLN
INTRAMUSCULAR | Status: AC
  Filled 2017-11-06: qty 30

## 2017-11-06 MED ORDER — ASPIRIN EC 325 MG PO TBEC
325.0000 mg | DELAYED_RELEASE_TABLET | Freq: Every day | ORAL | Status: DC
  Administered 2017-11-07: 325 mg via ORAL
  Filled 2017-11-06: qty 1

## 2017-11-06 MED ORDER — 0.9 % SODIUM CHLORIDE (POUR BTL) OPTIME
TOPICAL | Status: DC | PRN
  Administered 2017-11-06: 1000 mL

## 2017-11-06 MED ORDER — PROPOFOL 1000 MG/100ML IV EMUL
INTRAVENOUS | Status: AC
  Filled 2017-11-06: qty 100

## 2017-11-06 MED ORDER — ACETAMINOPHEN 500 MG PO TABS
1000.0000 mg | ORAL_TABLET | Freq: Three times a day (TID) | ORAL | 0 refills | Status: AC
Start: 2017-11-06 — End: 2017-11-20

## 2017-11-06 MED ORDER — ASPIRIN EC 325 MG PO TBEC: 325.0000 mg | DELAYED_RELEASE_TABLET | Freq: Every day | ORAL | 0 refills | Status: DC

## 2017-11-06 MED ORDER — ONDANSETRON HCL 4 MG/2ML IJ SOLN: 4.0000 mg | Freq: Four times a day (QID) | INTRAMUSCULAR | Status: DC | PRN

## 2017-11-06 MED ORDER — ALLOPURINOL 100 MG PO TABS
100.0000 mg | ORAL_TABLET | Freq: Every day | ORAL | Status: DC
  Administered 2017-11-07: 100 mg via ORAL
  Filled 2017-11-06: qty 1

## 2017-11-06 MED ORDER — OXYCODONE HCL 5 MG PO TABS: 5.0000 mg | ORAL_TABLET | ORAL | 0 refills | Status: AC | PRN

## 2017-11-06 MED ORDER — ACETAMINOPHEN 325 MG PO TABS: 650.0000 mg | ORAL_TABLET | ORAL | Status: DC | PRN

## 2017-11-06 MED ORDER — ONDANSETRON HCL 4 MG/2ML IJ SOLN
INTRAMUSCULAR | Status: DC | PRN
  Administered 2017-11-06: 4 mg via INTRAVENOUS

## 2017-11-06 MED ORDER — OXYCODONE HCL 5 MG PO TABS
5.0000 mg | ORAL_TABLET | ORAL | Status: DC | PRN
  Administered 2017-11-07 (×2): 5 mg via ORAL
  Filled 2017-11-06 (×2): qty 1

## 2017-11-06 MED ORDER — ONDANSETRON HCL 4 MG PO TABS: 4.0000 mg | ORAL_TABLET | Freq: Four times a day (QID) | ORAL | Status: DC | PRN

## 2017-11-06 MED ORDER — SORBITOL 70 % SOLN
30.0000 mL | Freq: Every day | Status: DC | PRN
  Filled 2017-11-06: qty 30

## 2017-11-06 MED ORDER — MIDAZOLAM HCL 2 MG/2ML IJ SOLN
INTRAMUSCULAR | Status: AC
  Administered 2017-11-06: 1 mg via INTRAVENOUS
  Filled 2017-11-06: qty 2

## 2017-11-06 MED ORDER — SODIUM CHLORIDE 0.9 % IR SOLN
Status: DC | PRN
  Administered 2017-11-06: 3000 mL

## 2017-11-06 MED ORDER — POLYETHYLENE GLYCOL 3350 17 G PO PACK: 17.0000 g | PACK | Freq: Every day | ORAL | Status: DC | PRN

## 2017-11-06 MED ORDER — METHOCARBAMOL 500 MG PO TABS: 500.0000 mg | ORAL_TABLET | Freq: Four times a day (QID) | ORAL | 0 refills | Status: DC | PRN

## 2017-11-06 MED ORDER — HYDROMORPHONE HCL 1 MG/ML IJ SOLN: 0.5000 mg | INTRAMUSCULAR | Status: DC | PRN

## 2017-11-06 MED ORDER — CELECOXIB 200 MG PO CAPS: 200.0000 mg | ORAL_CAPSULE | Freq: Two times a day (BID) | ORAL | 0 refills | Status: DC

## 2017-11-06 MED ORDER — OMEPRAZOLE 20 MG PO CPDR: 20.0000 mg | DELAYED_RELEASE_CAPSULE | Freq: Every day | ORAL | 0 refills | Status: DC

## 2017-11-06 MED ORDER — METOPROLOL SUCCINATE ER 25 MG PO TB24
25.0000 mg | ORAL_TABLET | Freq: Every day | ORAL | Status: DC
  Administered 2017-11-07: 25 mg via ORAL
  Filled 2017-11-06: qty 1

## 2017-11-06 MED ORDER — PROPOFOL 10 MG/ML IV BOLUS
INTRAVENOUS | Status: AC
  Filled 2017-11-06: qty 20

## 2017-11-06 MED ORDER — METHOCARBAMOL 1000 MG/10ML IJ SOLN
500.0000 mg | Freq: Four times a day (QID) | INTRAVENOUS | Status: DC | PRN
  Filled 2017-11-06: qty 5

## 2017-11-06 MED ORDER — PHENYLEPHRINE HCL 10 MG/ML IJ SOLN
INTRAVENOUS | Status: DC | PRN
  Administered 2017-11-06: 20 ug/min via INTRAVENOUS

## 2017-11-06 MED ORDER — KETOROLAC TROMETHAMINE 30 MG/ML IJ SOLN
INTRAMUSCULAR | Status: AC
  Filled 2017-11-06: qty 1

## 2017-11-06 MED ORDER — HYDROMORPHONE HCL 1 MG/ML IJ SOLN: 0.2500 mg | INTRAMUSCULAR | Status: DC | PRN

## 2017-11-06 MED ORDER — DOCUSATE SODIUM 100 MG PO CAPS
100.0000 mg | ORAL_CAPSULE | Freq: Two times a day (BID) | ORAL | Status: DC
  Administered 2017-11-06 – 2017-11-07 (×2): 100 mg via ORAL
  Filled 2017-11-06 (×2): qty 1

## 2017-11-06 MED ORDER — MENTHOL 3 MG MT LOZG: 1.0000 | LOZENGE | OROMUCOSAL | Status: DC | PRN

## 2017-11-06 MED ORDER — KETOROLAC TROMETHAMINE 30 MG/ML IJ SOLN
INTRAMUSCULAR | Status: DC | PRN
  Administered 2017-11-06: 30 mg

## 2017-11-06 MED ORDER — SIMVASTATIN 40 MG PO TABS
40.0000 mg | ORAL_TABLET | Freq: Every day | ORAL | Status: DC
  Administered 2017-11-06 – 2017-11-07 (×2): 40 mg via ORAL
  Filled 2017-11-06 (×2): qty 1

## 2017-11-06 MED ORDER — METOCLOPRAMIDE HCL 5 MG/ML IJ SOLN: 5.0000 mg | Freq: Three times a day (TID) | INTRAMUSCULAR | Status: DC | PRN

## 2017-11-06 MED ORDER — OXYCODONE HCL 5 MG PO TABS
10.0000 mg | ORAL_TABLET | ORAL | Status: DC | PRN
  Administered 2017-11-07: 10 mg via ORAL
  Filled 2017-11-06 (×2): qty 2

## 2017-11-06 MED ORDER — BUPIVACAINE HCL (PF) 0.25 % IJ SOLN
INTRAMUSCULAR | Status: DC | PRN
  Administered 2017-11-06: 30 mL

## 2017-11-06 MED ORDER — GABAPENTIN 300 MG PO CAPS
300.0000 mg | ORAL_CAPSULE | Freq: Three times a day (TID) | ORAL | Status: DC
  Administered 2017-11-06 – 2017-11-07 (×3): 300 mg via ORAL
  Filled 2017-11-06 (×4): qty 1

## 2017-11-06 MED ORDER — SENNA 8.6 MG PO TABS
1.0000 | ORAL_TABLET | Freq: Two times a day (BID) | ORAL | Status: DC
  Administered 2017-11-06 – 2017-11-07 (×2): 8.6 mg via ORAL
  Filled 2017-11-06 (×2): qty 1

## 2017-11-06 MED ORDER — DEXAMETHASONE SODIUM PHOSPHATE 10 MG/ML IJ SOLN
10.0000 mg | Freq: Once | INTRAMUSCULAR | Status: AC
  Administered 2017-11-07: 10 mg via INTRAVENOUS
  Filled 2017-11-06: qty 1

## 2017-11-06 MED ORDER — ROPIVACAINE HCL 7.5 MG/ML IJ SOLN
INTRAMUSCULAR | Status: DC | PRN
  Administered 2017-11-06: 20 mL via PERINEURAL

## 2017-11-06 MED ORDER — LISINOPRIL-HYDROCHLOROTHIAZIDE 10-12.5 MG PO TABS: 1.0000 | ORAL_TABLET | Freq: Every day | ORAL | Status: DC

## 2017-11-06 MED ORDER — LACTATED RINGERS IV SOLN
INTRAVENOUS | Status: DC
  Administered 2017-11-06: 18:00:00 via INTRAVENOUS

## 2017-11-06 MED ORDER — LISINOPRIL 10 MG PO TABS
10.0000 mg | ORAL_TABLET | Freq: Every day | ORAL | Status: DC
  Administered 2017-11-06 – 2017-11-07 (×2): 10 mg via ORAL
  Filled 2017-11-06 (×2): qty 1

## 2017-11-06 MED ORDER — PROPOFOL 10 MG/ML IV BOLUS
INTRAVENOUS | Status: DC | PRN
  Administered 2017-11-06: 20 mg via INTRAVENOUS

## 2017-11-06 MED ORDER — ACETAMINOPHEN 500 MG PO TABS
1000.0000 mg | ORAL_TABLET | Freq: Three times a day (TID) | ORAL | Status: DC
  Administered 2017-11-06 – 2017-11-07 (×3): 1000 mg via ORAL
  Filled 2017-11-06 (×3): qty 2

## 2017-11-06 MED ORDER — METHOCARBAMOL 500 MG PO TABS: 500.0000 mg | ORAL_TABLET | Freq: Four times a day (QID) | ORAL | Status: DC | PRN

## 2017-11-06 MED ORDER — DIPHENHYDRAMINE HCL 12.5 MG/5ML PO ELIX: 12.5000 mg | ORAL_SOLUTION | ORAL | Status: DC | PRN

## 2017-11-06 MED ORDER — SODIUM CHLORIDE FLUSH 0.9 % IV SOLN
INTRAVENOUS | Status: DC | PRN
  Administered 2017-11-06: 30 mL

## 2017-11-06 MED ORDER — LACTATED RINGERS IV SOLN
INTRAVENOUS | Status: DC
  Administered 2017-11-06 (×2): via INTRAVENOUS

## 2017-11-06 MED ORDER — BUPIVACAINE IN DEXTROSE 0.75-8.25 % IT SOLN
INTRATHECAL | Status: DC | PRN
  Administered 2017-11-06: 15 mg via INTRATHECAL

## 2017-11-06 MED ORDER — ONDANSETRON HCL 4 MG PO TABS: 4.0000 mg | ORAL_TABLET | Freq: Three times a day (TID) | ORAL | 0 refills | Status: DC | PRN

## 2017-11-06 MED ORDER — FENTANYL CITRATE (PF) 250 MCG/5ML IJ SOLN
INTRAMUSCULAR | Status: AC
  Filled 2017-11-06: qty 5

## 2017-11-06 MED ORDER — FENTANYL CITRATE (PF) 100 MCG/2ML IJ SOLN
100.0000 ug | Freq: Once | INTRAMUSCULAR | Status: AC
  Administered 2017-11-06: 100 ug via INTRAVENOUS

## 2017-11-06 MED ORDER — MIDAZOLAM HCL 2 MG/2ML IJ SOLN
INTRAMUSCULAR | Status: AC
  Filled 2017-11-06: qty 2

## 2017-11-06 MED ORDER — MIDAZOLAM HCL 2 MG/2ML IJ SOLN
1.0000 mg | Freq: Once | INTRAMUSCULAR | Status: AC
  Administered 2017-11-06: 1 mg via INTRAVENOUS

## 2017-11-06 MED ORDER — FENTANYL CITRATE (PF) 100 MCG/2ML IJ SOLN
INTRAMUSCULAR | Status: AC
  Administered 2017-11-06: 100 ug via INTRAVENOUS
  Filled 2017-11-06: qty 2

## 2017-11-06 MED ORDER — DEXAMETHASONE SODIUM PHOSPHATE 10 MG/ML IJ SOLN
INTRAMUSCULAR | Status: DC | PRN
  Administered 2017-11-06: 10 mg via INTRAVENOUS

## 2017-11-06 MED ORDER — PROPOFOL 500 MG/50ML IV EMUL
INTRAVENOUS | Status: DC | PRN
  Administered 2017-11-06: 100 ug/kg/min via INTRAVENOUS

## 2017-11-06 MED ORDER — CHLORHEXIDINE GLUCONATE 4 % EX LIQD: 60.0000 mL | Freq: Once | CUTANEOUS | Status: DC

## 2017-11-06 MED ORDER — ACETAMINOPHEN 650 MG RE SUPP: 650.0000 mg | RECTAL | Status: DC | PRN

## 2017-11-06 SURGICAL SUPPLY — 64 items
BANDAGE ACE 6X5 VEL STRL LF (GAUZE/BANDAGES/DRESSINGS) ×3 IMPLANT
BANDAGE ESMARK 6X9 LF (GAUZE/BANDAGES/DRESSINGS) ×1 IMPLANT
BNDG ESMARK 6X9 LF (GAUZE/BANDAGES/DRESSINGS) ×3
BOWL SMART MIX CTS (DISPOSABLE) ×3 IMPLANT
CAPT KNEE PARTIAL 2 ×3 IMPLANT
CEMENT BONE R 1X40 (Cement) ×3 IMPLANT
CHLORAPREP W/TINT 26ML (MISCELLANEOUS) ×3 IMPLANT
CLOSURE STERI-STRIP 1/2X4 (GAUZE/BANDAGES/DRESSINGS)
CLOSURE WOUND 1/2 X4 (GAUZE/BANDAGES/DRESSINGS) ×1
CLSR STERI-STRIP ANTIMIC 1/2X4 (GAUZE/BANDAGES/DRESSINGS) IMPLANT
COVER SURGICAL LIGHT HANDLE (MISCELLANEOUS) ×3 IMPLANT
CUFF TOURNIQUET SINGLE 34IN LL (TOURNIQUET CUFF) ×3 IMPLANT
DRAPE ARTHROSCOPY W/POUCH 114 (DRAPES) ×3 IMPLANT
DRAPE HALF SHEET 40X57 (DRAPES) ×3 IMPLANT
DRAPE IMP U-DRAPE 54X76 (DRAPES) ×6 IMPLANT
DRAPE U-SHAPE 47X51 STRL (DRAPES) ×3 IMPLANT
DRSG MEPILEX BORDER 4X12 (GAUZE/BANDAGES/DRESSINGS) ×3 IMPLANT
DRSG MEPILEX BORDER 4X4 (GAUZE/BANDAGES/DRESSINGS) IMPLANT
DRSG MEPILEX BORDER 4X8 (GAUZE/BANDAGES/DRESSINGS) IMPLANT
ELECT CAUTERY BLADE 6.4 (BLADE) ×3 IMPLANT
ELECT REM PT RETURN 9FT ADLT (ELECTROSURGICAL) ×3
ELECTRODE REM PT RTRN 9FT ADLT (ELECTROSURGICAL) ×1 IMPLANT
EVACUATOR 1/8 PVC DRAIN (DRAIN) IMPLANT
FACESHIELD WRAPAROUND (MASK) ×6 IMPLANT
GLOVE BIO SURGEON STRL SZ7.5 (GLOVE) ×6 IMPLANT
GLOVE BIOGEL PI IND STRL 6.5 (GLOVE) ×1 IMPLANT
GLOVE BIOGEL PI IND STRL 7.0 (GLOVE) ×1 IMPLANT
GLOVE BIOGEL PI IND STRL 8 (GLOVE) ×2 IMPLANT
GLOVE BIOGEL PI INDICATOR 6.5 (GLOVE) ×2
GLOVE BIOGEL PI INDICATOR 7.0 (GLOVE) ×2
GLOVE BIOGEL PI INDICATOR 8 (GLOVE) ×4
GLOVE ECLIPSE 6.5 STRL STRAW (GLOVE) ×3 IMPLANT
GLOVE ECLIPSE 8.5 STRL (GLOVE) ×3 IMPLANT
GOWN STRL REUS W/ TWL LRG LVL3 (GOWN DISPOSABLE) ×2 IMPLANT
GOWN STRL REUS W/ TWL XL LVL3 (GOWN DISPOSABLE) ×1 IMPLANT
GOWN STRL REUS W/TWL LRG LVL3 (GOWN DISPOSABLE) ×4
GOWN STRL REUS W/TWL XL LVL3 (GOWN DISPOSABLE) ×3
HANDPIECE INTERPULSE COAX TIP (DISPOSABLE) ×2
IMMOBILIZER KNEE 22 UNIV (SOFTGOODS) ×3 IMPLANT
KIT BASIN OR (CUSTOM PROCEDURE TRAY) ×3 IMPLANT
KIT ROOM TURNOVER OR (KITS) ×3 IMPLANT
MANIFOLD NEPTUNE II (INSTRUMENTS) ×3 IMPLANT
NEEDLE 22X1 1/2 (OR ONLY) (NEEDLE) ×3 IMPLANT
NS IRRIG 1000ML POUR BTL (IV SOLUTION) ×3 IMPLANT
PACK BLADE SAW RECIP 70 3 PT (BLADE) ×3 IMPLANT
PACK TOTAL JOINT (CUSTOM PROCEDURE TRAY) ×3 IMPLANT
PACK UNIVERSAL I (CUSTOM PROCEDURE TRAY) ×3 IMPLANT
PAD ARMBOARD 7.5X6 YLW CONV (MISCELLANEOUS) ×3 IMPLANT
SET HNDPC FAN SPRY TIP SCT (DISPOSABLE) ×1 IMPLANT
STAPLER VISISTAT 35W (STAPLE) IMPLANT
STRIP CLOSURE SKIN 1/2X4 (GAUZE/BANDAGES/DRESSINGS) ×2 IMPLANT
SUCTION FRAZIER HANDLE 10FR (MISCELLANEOUS) ×2
SUCTION TUBE FRAZIER 10FR DISP (MISCELLANEOUS) ×1 IMPLANT
SUT MNCRL AB 4-0 PS2 18 (SUTURE) IMPLANT
SUT MON AB 2-0 CT1 27 (SUTURE) ×6 IMPLANT
SUT MON AB 2-0 CT1 36 (SUTURE) ×3 IMPLANT
SUT VIC AB 0 CT1 27 (SUTURE) ×2
SUT VIC AB 0 CT1 27XBRD ANBCTR (SUTURE) ×1 IMPLANT
SUT VIC AB 1 CT1 27 (SUTURE) ×2
SUT VIC AB 1 CT1 27XBRD ANBCTR (SUTURE) ×1 IMPLANT
SUT VIC AB 1 CTX 36 (SUTURE) ×6
SUT VIC AB 1 CTX36XBRD ANBCTR (SUTURE) ×2 IMPLANT
TOWEL OR 17X24 6PK STRL BLUE (TOWEL DISPOSABLE) ×3 IMPLANT
TOWEL OR 17X26 10 PK STRL BLUE (TOWEL DISPOSABLE) ×3 IMPLANT

## 2017-11-06 NOTE — Discharge Instructions (Signed)

## 2017-11-06 NOTE — Anesthesia Procedure Notes (Signed)
Spinal  Patient location during procedure: OR Start time: 11/06/2017 9:58 AM End time: 11/06/2017 10:02 AM Staffing Anesthesiologist: Roderic Palau, MD Performed: anesthesiologist  Preanesthetic Checklist Completed: patient identified, surgical consent, pre-op evaluation, timeout performed, IV checked, risks and benefits discussed and monitors and equipment checked Spinal Block Patient position: sitting Prep: DuraPrep Patient monitoring: cardiac monitor, continuous pulse ox and blood pressure Approach: midline Location: L3-4 Injection technique: single-shot Needle Needle type: Pencan  Needle gauge: 24 G Needle length: 9 cm Assessment Sensory level: T8 Additional Notes Functioning IV was confirmed and monitors were applied. Sterile prep and drape, including hand hygiene and sterile gloves were used. The patient was positioned and the spine was prepped. The skin was anesthetized with lidocaine.  Free flow of clear CSF was obtained prior to injecting local anesthetic into the CSF.  The spinal needle aspirated freely following injection.  The needle was carefully withdrawn.  The patient tolerated the procedure well.

## 2017-11-06 NOTE — Transfer of Care (Signed)
Immediate Anesthesia Transfer of Care Note  Patient: [name]  Procedure(s) Performed: UNICOMPARTMENTAL KNEE (Left Knee)  Patient Location: PACU  Anesthesia Type:Spinal and MAC combined with regional for post-op pain  Level of Consciousness: awake, alert  and oriented  Airway & Oxygen Therapy: Patient Spontanous Breathing and Patient connected to face mask oxygen  Post-op Assessment: Report given to RN and Post -op Vital signs reviewed and stable  Post vital signs: Reviewed and stable  Last Vitals:  Vitals:   11/06/17 0835 11/06/17 0840  BP: 119/62 (!) 102/57  Pulse: (!) 57 61  Resp:    Temp:    SpO2: 99% 100%    Last Pain:  Vitals:   11/06/17 0816  TempSrc:   PainSc: 8          Complications: No apparent anesthesia complications

## 2017-11-06 NOTE — Progress Notes (Signed)
Orthopedic Tech Progress Note Patient Details:  [name] [birth year] [id number]  CPM Left Knee CPM Left Knee: On Left Knee Flexion (Degrees): 90 Left Knee Extension (Degrees): 0 Additional Comments: foot roll   [name] 11/06/2017, 1:15 PM

## 2017-11-06 NOTE — Interval H&P Note (Signed)
History and Physical Interval Note:  11/06/2017 8:58 AM  [name]  has presented today for surgery, with the diagnosis of OA LEFT KNEE  The various methods of treatment have been discussed with the patient and family. After consideration of risks, benefits and other options for treatment, the patient has consented to  Procedure(s): UNICOMPARTMENTAL KNEE (Left) as a surgical intervention .  The patient's history has been reviewed, patient examined, no change in status, stable for surgery.  I have reviewed the patient's chart and labs.  Questions were answered to the patient's satisfaction.     Kendalynn Wideman D

## 2017-11-06 NOTE — Op Note (Signed)
11/06/2017  11:13 AM  PATIENT:  [name]    PRE-OPERATIVE DIAGNOSIS:  OA LEFT KNEE  POST-OPERATIVE DIAGNOSIS:  Same  PROCEDURE:  UNICOMPARTMENTAL KNEE  SURGEON:  Lazariah Savard D, MD  PHYSICIAN ASSISTANT: Roxan Hockey, PA-C, he was present and scrubbed throughout the case, critical for completion in a timely fashion, and for retraction, instrumentation, and closure.   ANESTHESIA:   General  PREOPERATIVE INDICATIONS:  [name] is a  71 y.o. male with a diagnosis of OA LEFT KNEE who failed conservative measures and elected for surgical management.    The risks benefits and alternatives were discussed with the patient preoperatively including but not limited to the risks of infection, bleeding, nerve injury, cardiopulmonary complications, blood clots, the need for revision surgery, among others, and the patient was willing to proceed.  OPERATIVE IMPLANTS: Biomet Oxford mobile bearing medial compartment arthroplasty. Femoral Component: med. Tibial tray: D, Size 3 poly.   OPERATIVE FINDINGS: Endstage grade 4 medial compartment osteoarthritis. No significant changes in the lateral or patellofemoral joint  OPERATIVE PROCEDURE: The patient was brought to the operating room placed in supine position. General anesthesia was administered. IV antibiotics were given. The lower extremity was placed in the legholder and prepped and draped in usual sterile fashion.  Time out was performed.  The leg was elevated and exsanguinated and the tourniquet was inflated. Anteromedial incision was performed, and I took care to preserve the MCL. Parapatellar incision was carried out, and the osteophytes were excised, along with the medial meniscus and a small portion of the fat pad.  The extra medullary tibial cutting jig was applied, using the spoon and the 5mm G-Clamp, and I took care to protect the anterior cruciate ligament insertion and the tibial spine. The medial collateral ligament was  also protected, and I resected my proximal tibia, matching the anatomic slope.   The proximal tibial bony cut was removed in one piece, and I turned my attention to the femur.  The intramedullary femoral rod was placed using the drill, and then using the appropriate reference, I assembled the femoral jig, setting my posterior cutting block. I resected my posterior femur, and then measured my gap.   I then used the mill to match the extension gap to the flexion gap. The gaps were then measured again with the appropriate feeler gauges. Once I had balanced flexion and extension gaps, I then completed the preparation of the femur.  I milled off the anterior aspect of the distal femur to prevent impingement. I also exposed the tibia, and selected the above-named component, and then used the cutting jig to prepare the keel slot on the tibia. I also used the awl to curette out the bone to complete the preparation of the keel. The back wall was intact.  I then placed trial components, and it was found to have excellent motion, and appropriate balance.  I then cemented the components into place, cementing the tibia first, removing all excess cement, and then cementing the femur.  All loose cement was removed.  The real polyethylene insert was applied manually, and the knee was taken through functional range of motion, and found to have excellent stability and restoration of joint motion, with excellent balance.  The wounds were irrigated copiously, and the parapatellar tissue closed with Vicryl, followed by Vicryl for the subcutaneous tissue, with routine closure with Steri-Strips and sterile gauze.  The tourniquet was released, and the patient was awakened and extubated and returned to PACU in  stable and satisfactory condition. There were no complications.  POSTOPERATIVE PLAN: DVT px will consist of SCD's and chemical px, WBAT     [name], MD

## 2017-11-06 NOTE — Anesthesia Postprocedure Evaluation (Signed)
Anesthesia Post Note  Patient: [name]  Procedure(s) Performed: UNICOMPARTMENTAL KNEE (Left Knee)     Patient location during evaluation: PACU Anesthesia Type: Spinal and Regional Level of consciousness: awake and alert Pain management: pain level controlled Vital Signs Assessment: post-procedure vital signs reviewed and stable Respiratory status: spontaneous breathing and respiratory function stable Cardiovascular status: blood pressure returned to baseline and stable Postop Assessment: spinal receding Anesthetic complications: no    Last Vitals:  Vitals:   11/06/17 1345 11/06/17 1400  BP: 105/64 115/67  Pulse: (!) 49 (!) 51  Resp: 12 11  Temp:    SpO2: 100% 99%    Last Pain:  Vitals:   11/06/17 1300  TempSrc:   PainSc: 0-No pain                 Estephany Perot,W. EDMOND

## 2017-11-06 NOTE — Anesthesia Procedure Notes (Signed)
Anesthesia Regional Block: Adductor canal block   Pre-Anesthetic Checklist: ,, timeout performed, Correct Patient, Correct Site, Correct Laterality, Correct Procedure, Correct Position, site marked, Risks and benefits discussed, pre-op evaluation,  At surgeon's request and post-op pain management  Laterality: Left  Prep: Maximum Sterile Barrier Precautions used, chloraprep       Needles:  Injection technique: Single-shot  Needle Type: Echogenic Stimulator Needle     Needle Length: 9cm  Needle Gauge: 21     Additional Needles:   Procedures:,,,, ultrasound used (permanent image in chart),,,,  Narrative:  Start time: 11/06/2017 8:26 AM End time: 11/06/2017 8:36 AM Injection made incrementally with aspirations every 5 mL.  Performed by: Personally  Anesthesiologist: Roderic Palau, MD  Additional Notes: 2% Lidocaine skin wheel.

## 2017-11-06 NOTE — Anesthesia Preprocedure Evaluation (Signed)
Anesthesia Evaluation  Patient identified by MRN, date of birth, ID band Patient awake    Reviewed: Allergy & Precautions, H&P , NPO status , Patient's Chart, lab work & pertinent test results, reviewed documented beta blocker date and time   Airway Mallampati: II  TM Distance: >3 FB Neck ROM: Full    Dental no notable dental hx. (+) Upper Dentures, Lower Dentures, Dental Advisory Given   Pulmonary neg pulmonary ROS, former smoker,    Pulmonary exam normal breath sounds clear to auscultation       Cardiovascular hypertension, Pt. on medications and Pt. on home beta blockers  Rhythm:Regular Rate:Normal     Neuro/Psych negative neurological ROS  negative psych ROS   GI/Hepatic negative GI ROS, Neg liver ROS,   Endo/Other  negative endocrine ROS  Renal/GU negative Renal ROS  negative genitourinary   Musculoskeletal  (+) Arthritis , Osteoarthritis,    Abdominal   Peds  Hematology negative hematology ROS (+)   Anesthesia Other Findings   Reproductive/Obstetrics negative OB ROS                             Anesthesia Physical Anesthesia Plan  ASA: II  Anesthesia Plan: Spinal   Post-op Pain Management:  Regional for Post-op pain   Induction: Intravenous  PONV Risk Score and Plan: 2 and Ondansetron, Dexamethasone and Propofol infusion  Airway Management Planned: Simple Face Mask  Additional Equipment:   Intra-op Plan:   Post-operative Plan:   Informed Consent: I have reviewed the patients History and Physical, chart, labs and discussed the procedure including the risks, benefits and alternatives for the proposed anesthesia with the patient or authorized representative who has indicated his/her understanding and acceptance.   Dental advisory given  Plan Discussed with: CRNA  Anesthesia Plan Comments:         Anesthesia Quick Evaluation

## 2017-11-06 NOTE — Anesthesia Procedure Notes (Signed)
Procedure Name: MAC Date/Time: 11/06/2017 9:58 AM Performed by: Barrington Ellison, CRNA Pre-anesthesia Checklist: Patient identified, Emergency Drugs available, Suction available, Patient being monitored and Timeout performed Patient Re-evaluated:Patient Re-evaluated prior to induction Oxygen Delivery Method: Simple face mask

## 2017-11-07 DIAGNOSIS — E785 Hyperlipidemia, unspecified: Secondary | ICD-10-CM | POA: Diagnosis not present

## 2017-11-07 DIAGNOSIS — M25762 Osteophyte, left knee: Secondary | ICD-10-CM | POA: Diagnosis not present

## 2017-11-07 DIAGNOSIS — I10 Essential (primary) hypertension: Secondary | ICD-10-CM | POA: Diagnosis not present

## 2017-11-07 DIAGNOSIS — M109 Gout, unspecified: Secondary | ICD-10-CM | POA: Diagnosis not present

## 2017-11-07 DIAGNOSIS — M1712 Unilateral primary osteoarthritis, left knee: Secondary | ICD-10-CM | POA: Diagnosis not present

## 2017-11-07 DIAGNOSIS — M419 Scoliosis, unspecified: Secondary | ICD-10-CM | POA: Diagnosis not present

## 2017-11-07 NOTE — Progress Notes (Signed)
Patient was given discharge instructions and verbalized understanding. Patient was given equipment (walker) and aware of follow appointments. Patient left unit via wheelchair with nursing staff and spouse in stable condition.

## 2017-11-07 NOTE — Progress Notes (Signed)
Orthopedic Tech Progress Note Patient Details:  [name] [birth year] [id number]  Patient ID: [name], male   DOB: 1946-08-10, 71 y.o.   MRN: [id number]   [name] 11/07/2017, 11:07 AM Placed pt's lle on cpm @0 -60 degrees @1055 ; RN notified

## 2017-11-07 NOTE — Discharge Summary (Signed)
Discharge Summary  Patient ID: [name] MRN: [id number] DOB/AGE: [birth year] 71 y.o.  Admit date: 11/06/2017 Discharge date: 11/07/2017  Admission Diagnoses:  Primary osteoarthritis of left knee  Discharge Diagnoses:  Principal Problem:   Primary osteoarthritis of left knee Active Problems:   Essential hypertension   Hyperlipidemia   Gout   Past Medical History:  Diagnosis Date  . Cellulitis and abscess of other specified site   . Colon polyps   . Gout   . Hypertension   . Neuromuscular disorder (HCC)    arthritis  . Scoliosis     Surgeries: Procedure(s): UNICOMPARTMENTAL KNEE on 11/06/2017   Consultants (if any):   Discharged Condition: Improved  Hospital Course: [name] is an 71 y.o. male who was admitted 11/06/2017 with a diagnosis of Primary osteoarthritis of left knee and went to the operating room on 11/06/2017 and underwent the above named procedures.    He was given perioperative antibiotics:  Anti-infectives (From admission, onward)   Start     Dose/Rate Route Frequency Ordered Stop   11/06/17 1800  ceFAZolin (ANCEF) IVPB 1 g/50 mL premix     1 g 100 mL/hr over 30 Minutes Intravenous Every 6 hours 11/06/17 1708 11/07/17 0125   11/06/17 0930  ceFAZolin (ANCEF) IVPB 2g/100 mL premix     2 g 200 mL/hr over 30 Minutes Intravenous To [facility] 11/05/17 0945 11/06/17 0955    .  He was given sequential compression devices, early ambulation, and Aspirin for DVT prophylaxis.  He benefited maximally from the hospital stay and there were no complications.    Recent vital signs:  Vitals:   11/07/17 0100 11/07/17 0435  BP: 116/69 117/60  Pulse: 73 79  Resp: 15 17  Temp: 98.4 F (36.9 C) 97.6 F (36.4 C)  SpO2: 97% 96%    Recent laboratory studies:  Lab Results  Component Value Date   HGB 14.1 10/26/2017   HGB 14.6 07/18/2017   HGB 14.5 01/19/2017   Lab Results  Component Value Date   WBC 7.5 10/26/2017   PLT 182 10/26/2017    No results found for: INR Lab Results  Component Value Date   NA 141 10/29/2017   K 4.9 10/29/2017   CL 102 10/29/2017   CO2 29 10/29/2017   BUN 23 10/29/2017   CREATININE 1.15 10/29/2017   GLUCOSE 84 10/29/2017    Discharge Medications:   Allergies as of 11/07/2017      Reactions   Bee Venom Anaphylaxis      Medication List    STOP taking these medications   diclofenac 75 MG EC tablet Commonly known as:  VOLTAREN   HYDROcodone-acetaminophen 5-325 MG tablet Commonly known as:  NORCO/VICODIN     TAKE these medications   acetaminophen 500 MG tablet Commonly known as:  TYLENOL Take 2 tablets (1,000 mg total) by mouth every 8 (eight) hours for 14 days.   allopurinol 100 MG tablet Commonly known as:  ZYLOPRIM Take 1 tablet (100 mg total) by mouth daily.   aspirin EC 325 MG tablet Take 1 tablet (325 mg total) by mouth daily. For 30 days post op for DVT Prophylaxis What changed:    medication strength  how much to take  additional instructions   celecoxib 200 MG capsule Commonly known as:  CELEBREX Take 1 capsule (200 mg total) by mouth 2 (two) times daily. For 2 weeks post op.  Discontinue Ibuprofen when taking this medicine.   docusate sodium 100 MG  capsule Commonly known as:  COLACE Take 1 capsule (100 mg total) by mouth 2 (two) times daily. To prevent constipation while taking pain medication.   EPIPEN 2-PAK 0.3 mg/0.3 mL Soaj injection Generic drug:  EPINEPHrine Inject 0.3 mg into the muscle once.   gabapentin 300 MG capsule Commonly known as:  NEURONTIN Take 300 mg by mouth 3 (three) times daily.   lisinopril-hydrochlorothiazide 10-12.5 MG tablet Commonly known as:  PRINZIDE,ZESTORETIC TAKE 1 TABLET BY MOUTH DAILY.   methocarbamol 500 MG tablet Commonly known as:  ROBAXIN Take 1 tablet (500 mg total) by mouth every 6 (six) hours as needed for muscle spasms.   metoprolol succinate 25 MG 24 hr tablet Commonly known as:  TOPROL-XL Take 1 tablet  (25 mg total) by mouth daily.   omeprazole 20 MG capsule Commonly known as:  PRILOSEC Take 1 capsule (20 mg total) by mouth daily. 30 days for gastroprotection while taking Aspirin.   ondansetron 4 MG tablet Commonly known as:  ZOFRAN Take 1 tablet (4 mg total) by mouth every 8 (eight) hours as needed for nausea or vomiting.   oxyCODONE 5 MG immediate release tablet Commonly known as:  ROXICODONE Take 1-2 tablets (5-10 mg total) by mouth every 4 (four) hours as needed for breakthrough pain.   simvastatin 40 MG tablet Commonly known as:  ZOCOR Take 1 tablet (40 mg total) by mouth daily.       Diagnostic Studies: Dg Knee Left Port  Result Date: 11/06/2017 CLINICAL DATA:  Status post left hemiarthroplasty. EXAM: PORTABLE LEFT KNEE - 1-2 VIEW COMPARISON:  None. FINDINGS: Status post left medial hemiarthroplasty, with femoral and tibial components well situated. Fluid level is noted in suprapatellar bursa as well as other postoperative changes are noted anteriorly. No acute fracture or dislocation is noted. IMPRESSION: Status post left knee medial hemiarthroplasty. Electronically Signed   By: Marijo Conception, M.D.   On: 11/06/2017 13:06    Disposition: 01-Home or Self Care  Discharge Instructions    Discharge patient   Complete by:  As directed    After therapy evaluations.  Must be urinating spontaneously.   Discharge disposition:  01-Home or Self Care   Discharge patient date:  11/07/2017      Follow-up Information    Steven Butters, MD Follow up.   Specialty:  Orthopedic Surgery Contact information: [address] [place] [address] [phone]            Signed: Prudencio Burly III PA-C 11/07/2017, 7:40 AM

## 2017-11-07 NOTE — Care Management Note (Signed)
Case Management Note  Patient Details  Name: [name] MRN: [id number] Date of Birth: [birth year]  Subjective/Objective:                    Action/Plan:  Patient has walker and CPM at home  Expected Discharge Date:  11/07/17               Expected Discharge Plan:  [place]  In-House Referral:     Discharge planning Services  CM Consult  Post Acute Care Choice:  Durable Medical Equipment, Home Health Choice offered to:     DME Arranged:    DME Agency:     HH Arranged:  PT [place]:  [facility]  Status of Service:  Completed, signed off  If discussed at [employer] of Stay Meetings, dates discussed:    Additional Comments:  Marilu Favre, RN 11/07/2017, 12:14 PM

## 2017-11-07 NOTE — Evaluation (Signed)
Occupational Therapy Evaluation Patient Details Name: [name] MRN: [id number] DOB: Jan 01, 1946 Today's Date: 11/07/2017    History of Present Illness This 71 y.o. male admitted for unicompartmental knee on the Lt   PMH includes:  neuromuscluar disorder, scoliosis, Gout, HTN   Clinical Impression   Patient evaluated by Occupational Therapy with no further acute OT needs identified. All education has been completed and the patient has no further questions. Pt requires min a for LB ADLs.  Wife is very supportive and can assist.  All education completed.  See below for any follow-up Occupational Therapy or equipment needs. OT is signing off. Thank you for this referral.        Follow Up Recommendations  No OT follow up;Supervision/Assistance - 24 hour    Equipment Recommendations  None recommended by OT    Recommendations for Other Services       Precautions / Restrictions Precautions Precautions: Fall;Knee Restrictions Weight Bearing Restrictions: Yes LLE Weight Bearing: Weight bearing as tolerated      Mobility Bed Mobility Overal bed mobility: Modified Independent                Transfers Overall transfer level: Needs assistance Equipment used: Rolling walker (2 wheeled) Transfers: Sit to/from [employer] Transfers Sit to Stand: Supervision Stand pivot transfers: Supervision            Balance                                           ADL either performed or assessed with clinical judgement   ADL Overall ADL's : Needs assistance/impaired Eating/Feeding: Independent   Grooming: Wash/dry hands;Wash/dry face;Oral care;Brushing hair;Supervision/safety;Standing   Upper Body Bathing: Set up;Sitting   Lower Body Bathing: Sit to/from stand;Minimal assistance   Upper Body Dressing : Set up;Sitting   Lower Body Dressing: Sit to/from stand;Minimal assistance Lower Body Dressing Details (indicate cue type and reason): assist  to access Lt foot  Toilet Transfer: Supervision/safety;Ambulation;Comfort height toilet;Grab bars;RW   Toileting- Clothing Manipulation and Hygiene: Supervision/safety;Sit to/from stand     Tub/Shower Transfer Details (indicate cue type and reason): Instructed pt and wife on safe technique for shower transfer  Functional mobility during ADLs: Supervision/safety;Rolling walker General ADL Comments: reviewed use of walker bag and to remove area rugs      Vision         Perception     Praxis      Pertinent Vitals/Pain Pain Assessment: 0-10 Pain Score: 5  Pain Location: Lt knee  Pain Descriptors / Indicators: Operative site guarding;Aching Pain Intervention(s): Premedicated before session;Monitored during session;Repositioned     Hand Dominance Right   Extremity/Trunk Assessment Upper Extremity Assessment Upper Extremity Assessment: Overall WFL for tasks assessed   Lower Extremity Assessment Lower Extremity Assessment: Defer to PT evaluation   Cervical / Trunk Assessment Cervical / Trunk Assessment: Normal   Communication Communication Communication: No difficulties   Cognition Arousal/Alertness: Awake/alert Behavior During Therapy: WFL for tasks assessed/performed Overall Cognitive Status: Within Functional Limits for tasks assessed                                     General Comments  wife present     Exercises     Shoulder Instructions      Home Living Family/patient expects  to be discharged to:: Private residence Living Arrangements: Spouse/significant other Available Help at Discharge: Family;Available 24 hours/day Type of Home: House       Home Layout: One level     Bathroom Shower/Tub: Occupational psychologist: Handicapped height                Prior Functioning/Environment Level of Independence: Independent                 OT Problem List: Decreased strength;Decreased activity tolerance;Pain      OT  Treatment/Interventions:      OT Goals(Current goals can be found in the care plan section) Acute Rehab OT Goals Patient Stated Goal: to go home  OT Goal Formulation: All assessment and education complete, DC therapy  OT Frequency:     Barriers to D/C:            Co-evaluation              AM-PAC PT "6 Clicks" Daily Activity     Outcome Measure Help from another person eating meals?: None Help from another person taking care of personal grooming?: A Little Help from another person toileting, which includes using toliet, bedpan, or urinal?: A Little Help from another person bathing (including washing, rinsing, drying)?: A Little Help from another person to put on and taking off regular upper body clothing?: A Little Help from another person to put on and taking off regular lower body clothing?: A Little 6 Click Score: 19   End of Session Equipment Utilized During Treatment: Rolling walker CPM Left Knee CPM Left Knee: Off Left Knee Flexion (Degrees): 60 Nurse Communication: Mobility status  Activity Tolerance: Patient tolerated treatment well Patient left: in bed;with call bell/phone within reach;with family/visitor present  OT Visit Diagnosis: Pain Pain - Right/Left: Left Pain - part of body: Knee                Time: 1220-1238 OT Time Calculation (min): 18 min Charges:  OT General Charges $OT Visit: 1 Visit OT Evaluation $OT Eval Low Complexity: 1 Low G-Codes: OT G-codes **NOT FOR INPATIENT CLASS** Functional Assessment Tool Used: AM-PAC 6 Clicks Daily Activity Functional Limitation: Self care Self Care Current Status (X5400): At least 40 percent but less than 60 percent impaired, limited or restricted Self Care Goal Status (Q6761): At least 40 percent but less than 60 percent impaired, limited or restricted Self Care Discharge Status [phone]): At least 40 percent but less than 60 percent impaired, limited or restricted   [facility],  OTR/L [phone]   Lucille Passy M 11/07/2017, 2:15 PM

## 2017-11-07 NOTE — Evaluation (Signed)
Physical Therapy Evaluation Patient Details Name: [name] MRN: [id number] DOB: [birth year] Today's Date: 11/07/2017   History of Present Illness  This 71 y.o. male admitted for unicompartmental knee on the Lt   PMH includes:  neuromuscluar disorder, scoliosis, Gout, HTN  Clinical Impression  This pt has progressed to managing RW with minor assistance, has reduced his need for help with control of L knee without bracing and able to control esp the descent of stairs with one rail on the R ascending.  Follow him as needed for gait and strengthening, and progress to HHPT for follow up of same, and then to outpatient as tolerated.    Follow Up Recommendations Home health PT;Supervision for mobility/OOB    Equipment Recommendations  Rolling walker with 5" wheels    Recommendations for Other Services       Precautions / Restrictions Precautions Precautions: Fall;Knee Restrictions Weight Bearing Restrictions: Yes LLE Weight Bearing: Weight bearing as tolerated      Mobility  Bed Mobility Overal bed mobility: Modified Independent                Transfers Overall transfer level: Needs assistance Equipment used: Rolling walker (2 wheeled) Transfers: Sit to/from [employer] Transfers Sit to Stand: Supervision Stand pivot transfers: Supervision          Ambulation/Gait Ambulation/Gait assistance: Min guard Ambulation Distance (Feet): 250 Feet Assistive device: Rolling walker (2 wheeled);1 person hand held assist Gait Pattern/deviations: Step-to pattern;Step-through pattern;Decreased stance time - left;Decreased weight shift to left;Decreased dorsiflexion - left;Decreased step length - left;Wide base of support;Trunk flexed;Antalgic Gait velocity: reduced Gait velocity interpretation: Below normal speed for age/gender    Stairs Stairs: Yes Stairs assistance: Min guard;Min assist Stair Management: One rail Right Number of Stairs: 5 General stair comments:  Pt is up to walk with short trip tolerated and going home with HHPT to follow him.  Wheelchair Mobility    Modified Rankin (Stroke Patients Only)       Balance Overall balance assessment: Needs assistance Sitting-balance support: Feet supported;Single extremity supported Sitting balance-Leahy Scale: Good   Postural control: Right lateral lean Standing balance support: Bilateral upper extremity supported;During functional activity Standing balance-Leahy Scale: Fair                               Pertinent Vitals/Pain Pain Assessment: 0-10 Pain Score: 5  Pain Location: Lt knee  Pain Descriptors / Indicators: Operative site guarding;Aching Pain Intervention(s): Premedicated before session;Monitored during session;Repositioned    Home Living Family/patient expects to be discharged to:: Private residence Living Arrangements: Spouse/significant other Available Help at Discharge: Family;Available 24 hours/day Type of Home: House       Home Layout: One level        Prior Function Level of Independence: Independent               Hand Dominance   Dominant Hand: Right    Extremity/Trunk Assessment   Upper Extremity Assessment Upper Extremity Assessment: Overall WFL for tasks assessed    Lower Extremity Assessment Lower Extremity Assessment: Defer to PT evaluation    Cervical / Trunk Assessment Cervical / Trunk Assessment: Normal  Communication   Communication: No difficulties  Cognition Arousal/Alertness: Awake/alert Behavior During Therapy: WFL for tasks assessed/performed Overall Cognitive Status: Within Functional Limits for tasks assessed  General Comments General comments (skin integrity, edema, etc.): wife present     Exercises General Exercises - Lower Extremity Ankle Circles/Pumps: AROM;Both;5 reps Quad Sets: AROM;Both;10 reps Heel Slides: AROM;Both;10 reps Hip  ABduction/ADduction: AROM;AAROM;Both;10 reps Straight Leg Raises: AROM;AAROM;Both;10 reps   Assessment/Plan    PT Assessment Patient needs continued PT services  PT Problem List Decreased strength;Decreased range of motion;Decreased activity tolerance;Decreased balance;Decreased mobility;Decreased coordination;Decreased cognition;Decreased knowledge of use of DME;Decreased safety awareness;Decreased knowledge of precautions;Decreased skin integrity;Pain       PT Treatment Interventions DME instruction;Gait training;Stair training;Functional mobility training;Therapeutic activities;Therapeutic exercise;Balance training;Neuromuscular re-education;Patient/family education    PT Goals (Current goals can be found in the Care Plan section)  Acute Rehab PT Goals Patient Stated Goal: to go home  PT Goal Formulation: With patient/family Time For Goal Achievement: 11/21/17 Potential to Achieve Goals: Good    Frequency 7X/week   Barriers to discharge Inaccessible home environment stairs to enter house    Co-evaluation               AM-PAC PT "6 Clicks" Daily Activity  Outcome Measure Difficulty turning over in bed (including adjusting bedclothes, sheets and blankets)?: A Little Difficulty moving from lying on back to sitting on the side of the bed? : Unable Difficulty sitting down on and standing up from a chair with arms (e.g., wheelchair, bedside commode, etc,.)?: Unable Help needed moving to and from a bed to chair (including a wheelchair)?: A Little Help needed walking in hospital room?: A Little Help needed climbing 3-5 steps with a railing? : A Little 6 Click Score: 14    End of Session Equipment Utilized During Treatment: Gait belt Activity Tolerance: Patient tolerated treatment well;Patient limited by pain Patient left: in bed;with call bell/phone within reach;with nursing/sitter in room;with family/visitor present Nurse Communication: Mobility status PT Visit Diagnosis:  Unsteadiness on feet (R26.81);Other abnormalities of gait and mobility (R26.89);Muscle weakness (generalized) (M62.81);Pain;Difficulty in walking, not elsewhere classified (R26.2) Pain - Right/Left: Left Pain - part of body: Knee    Time: 5726-2035 PT Time Calculation (min) (ACUTE ONLY): 35 min   Charges:   PT Evaluation $PT Eval Moderate Complexity: 1 Mod PT Treatments $Gait Training: 8-22 mins   PT G Codes:   PT G-Codes **NOT FOR INPATIENT CLASS** Functional Assessment Tool Used: AM-PAC 6 Clicks Basic Mobility;Clinical judgement Functional Limitation: Mobility: Walking and moving around Mobility: Walking and Moving Around Current Status (D9741): At least 20 percent but less than 40 percent impaired, limited or restricted Mobility: Walking and Moving Around Goal Status [phone]): At least 1 percent but less than 20 percent impaired, limited or restricted    Ramond Dial 11/07/2017, 3:31 PM   Mee Hives, PT MS Acute Rehab Dept. Number: [place]

## 2017-11-07 NOTE — Progress Notes (Signed)
   Assessment / Plan: 1 Day Post-Op  S/P Procedure(s) (LRB): UNICOMPARTMENTAL KNEE (Left) by Dr. Ernesta Amble. Murphy on 11/06/2017  Principal Problem:   Primary osteoarthritis of left knee Active Problems:   Essential hypertension   Hyperlipidemia   Gout   Primary osteoarthritis left knee.  Status post unicompartmental knee arthroplasty Pain controlled.  Eating, drinking, voiding.  Therapy evaluations pending.  Remove Foley Advance diet Up with therapy D/C IV fluids Discharge home with home health Incentive Spirometry Elevate and apply ice  Weight Bearing: Weight Bearing as Tolerated (WBAT)  Dressings: Maintain Mepilex.  Place TED hose prior to discharge Operative leg VTE prophylaxis: Aspirin, SCDs, ambulation Dispo: Home today after therapy.  Must be urinating spontaneously.  Subjective: Patient reports pain as mild to moderate.  Tolerating diet.  Urinating.  +Flatus.  No CP, SOB.   Objective:   VITALS:   Vitals:   11/06/17 2019 11/06/17 2356 11/07/17 0100 11/07/17 0435  BP: 118/62  116/69 117/60  Pulse: 97  73 79  Resp: 15  15 17   Temp: 98.1 F (36.7 C)  98.4 F (36.9 C) 97.6 F (36.4 C)  TempSrc: Oral  Oral Oral  SpO2: 95%  97% 96%  Weight:  93.4 kg (206 lb)    Height:  6' (1.829 m)     CBC Latest Ref Rng & Units 10/26/2017 07/18/2017 01/19/2017  WBC 4.0 - 10.5 K/uL 7.5 6.3 6.4  Hemoglobin 13.0 - 17.0 g/dL 14.1 14.6 14.5  Hematocrit 39.0 - 52.0 % 42.9 45.0 43.3  Platelets 150 - 400 K/uL 182 202.0 194.0   BMP Latest Ref Rng & Units 10/29/2017 10/26/2017 07/18/2017  Glucose 70 - 99 mg/dL 84 89 88  BUN 6 - 23 mg/dL 23 27(H) 21  Creatinine 0.40 - 1.50 mg/dL 1.15 1.36(H) 1.24  Sodium 135 - 145 mEq/L 141 138 140  Potassium 3.5 - 5.1 mEq/L 4.9 4.2 4.7  Chloride 96 - 112 mEq/L 102 104 103  CO2 19 - 32 mEq/L 29 26 31   Calcium 8.4 - 10.5 mg/dL 9.9 9.4 9.8   Intake/Output      11/20 0701 - 11/21 0700 11/21 0701 - 11/22 0700   P.O. 540    I.V. (mL/kg) 2400 (25.7)      Total Intake(mL/kg) 2940 (31.5)    Urine (mL/kg/hr) 1325    Blood 25    Total Output 1350    Net +1590            Physical Exam: General: NAD.  Upright in bed.,  Pleasant, conversant.  Bone foam in place Resp: No increased wob Cardio: regular rate and rhythm ABD soft Neurologically intact MSK Neurovascularly intact Sensation intact distally Intact pulses distally Dorsiflexion/Plantar flexion intact Incision: dressing C/D/I   Prudencio Burly III, PA-C 11/07/2017, 7:33 AM

## 2017-11-09 ENCOUNTER — Encounter (HOSPITAL_COMMUNITY): Payer: Self-pay | Admitting: Orthopedic Surgery

## 2017-11-09 DIAGNOSIS — M419 Scoliosis, unspecified: Secondary | ICD-10-CM | POA: Diagnosis not present

## 2017-11-09 DIAGNOSIS — M109 Gout, unspecified: Secondary | ICD-10-CM | POA: Diagnosis not present

## 2017-11-09 DIAGNOSIS — Z471 Aftercare following joint replacement surgery: Secondary | ICD-10-CM | POA: Diagnosis not present

## 2017-11-09 DIAGNOSIS — Z96652 Presence of left artificial knee joint: Secondary | ICD-10-CM | POA: Diagnosis not present

## 2017-11-09 DIAGNOSIS — I10 Essential (primary) hypertension: Secondary | ICD-10-CM | POA: Diagnosis not present

## 2017-11-13 DIAGNOSIS — M109 Gout, unspecified: Secondary | ICD-10-CM | POA: Diagnosis not present

## 2017-11-13 DIAGNOSIS — I10 Essential (primary) hypertension: Secondary | ICD-10-CM | POA: Diagnosis not present

## 2017-11-13 DIAGNOSIS — Z471 Aftercare following joint replacement surgery: Secondary | ICD-10-CM | POA: Diagnosis not present

## 2017-11-13 DIAGNOSIS — Z96652 Presence of left artificial knee joint: Secondary | ICD-10-CM | POA: Diagnosis not present

## 2017-11-13 DIAGNOSIS — M419 Scoliosis, unspecified: Secondary | ICD-10-CM | POA: Diagnosis not present

## 2017-11-14 DIAGNOSIS — Z96652 Presence of left artificial knee joint: Secondary | ICD-10-CM | POA: Diagnosis not present

## 2017-11-14 DIAGNOSIS — M419 Scoliosis, unspecified: Secondary | ICD-10-CM | POA: Diagnosis not present

## 2017-11-14 DIAGNOSIS — Z471 Aftercare following joint replacement surgery: Secondary | ICD-10-CM | POA: Diagnosis not present

## 2017-11-14 DIAGNOSIS — I10 Essential (primary) hypertension: Secondary | ICD-10-CM | POA: Diagnosis not present

## 2017-11-14 DIAGNOSIS — M109 Gout, unspecified: Secondary | ICD-10-CM | POA: Diagnosis not present

## 2017-11-16 DIAGNOSIS — I10 Essential (primary) hypertension: Secondary | ICD-10-CM | POA: Diagnosis not present

## 2017-11-16 DIAGNOSIS — M419 Scoliosis, unspecified: Secondary | ICD-10-CM | POA: Diagnosis not present

## 2017-11-16 DIAGNOSIS — Z471 Aftercare following joint replacement surgery: Secondary | ICD-10-CM | POA: Diagnosis not present

## 2017-11-16 DIAGNOSIS — Z96652 Presence of left artificial knee joint: Secondary | ICD-10-CM | POA: Diagnosis not present

## 2017-11-16 DIAGNOSIS — M109 Gout, unspecified: Secondary | ICD-10-CM | POA: Diagnosis not present

## 2017-11-20 DIAGNOSIS — M419 Scoliosis, unspecified: Secondary | ICD-10-CM | POA: Diagnosis not present

## 2017-11-20 DIAGNOSIS — M109 Gout, unspecified: Secondary | ICD-10-CM | POA: Diagnosis not present

## 2017-11-20 DIAGNOSIS — I10 Essential (primary) hypertension: Secondary | ICD-10-CM | POA: Diagnosis not present

## 2017-11-20 DIAGNOSIS — Z471 Aftercare following joint replacement surgery: Secondary | ICD-10-CM | POA: Diagnosis not present

## 2017-11-20 DIAGNOSIS — Z96652 Presence of left artificial knee joint: Secondary | ICD-10-CM | POA: Diagnosis not present

## 2017-11-21 DIAGNOSIS — M1712 Unilateral primary osteoarthritis, left knee: Secondary | ICD-10-CM | POA: Diagnosis not present

## 2017-11-22 ENCOUNTER — Other Ambulatory Visit: Payer: Self-pay | Admitting: Cardiovascular Disease

## 2017-11-23 DIAGNOSIS — M419 Scoliosis, unspecified: Secondary | ICD-10-CM | POA: Diagnosis not present

## 2017-11-23 DIAGNOSIS — Z96652 Presence of left artificial knee joint: Secondary | ICD-10-CM | POA: Diagnosis not present

## 2017-11-23 DIAGNOSIS — Z471 Aftercare following joint replacement surgery: Secondary | ICD-10-CM | POA: Diagnosis not present

## 2017-11-23 DIAGNOSIS — M109 Gout, unspecified: Secondary | ICD-10-CM | POA: Diagnosis not present

## 2017-11-23 DIAGNOSIS — I10 Essential (primary) hypertension: Secondary | ICD-10-CM | POA: Diagnosis not present

## 2017-12-03 ENCOUNTER — Ambulatory Visit: Payer: Medicare Other | Attending: Orthopedic Surgery | Admitting: Physical Therapy

## 2017-12-03 ENCOUNTER — Encounter: Payer: Self-pay | Admitting: Physical Therapy

## 2017-12-03 DIAGNOSIS — M25562 Pain in left knee: Secondary | ICD-10-CM | POA: Insufficient documentation

## 2017-12-03 DIAGNOSIS — R262 Difficulty in walking, not elsewhere classified: Secondary | ICD-10-CM | POA: Diagnosis not present

## 2017-12-03 NOTE — Therapy (Signed)
[facility] [place], [place], [address] Phone: [phone]   Fax:  [phone]  Physical Therapy Evaluation  Patient Details  Name: [name] MRN: [id number] Date of Birth: 1946/02/19 Referring Provider: Edmonia Lynch, MD   Encounter Date: 12/03/2017    Past Medical History:  Diagnosis Date  . Cellulitis and abscess of other specified site   . Colon polyps   . Gout   . Hypertension   . Neuromuscular disorder (HCC)    arthritis  . Scoliosis     Past Surgical History:  Procedure Laterality Date  . BACK SURGERY    . COLONOSCOPY  01/10/2013   Procedure: COLONOSCOPY;  Surgeon: Rogene Houston, MD;  [facility];  Service: Endoscopy;  Laterality: N/A;  730  . COLONOSCOPY N/A 08/21/2013   Procedure: COLONOSCOPY;  Surgeon: Rogene Houston, MD;  [facility];  Service: Endoscopy;  Laterality: N/A;  1030  . COLONOSCOPY N/A 08/26/2015   Procedure: COLONOSCOPY;  Surgeon: Rogene Houston, MD;  [facility];  Service: Endoscopy;  Laterality: N/A;  240  . colonscopy    . PARTIAL KNEE ARTHROPLASTY Left 11/06/2017   Procedure: UNICOMPARTMENTAL KNEE;  Surgeon: [name], MD;  Location: [facility];  Service: Orthopedics;  Laterality: Left;    There were no vitals filed for this visit.   Subjective Assessment - 12/03/17 1114    Subjective  Pt arriving to therapy s/p left partial knee replacement on 11/06/17. pt reporting no pain at rest, 2/10 pain with ADL's and during the day and 5/10 pain at night. Pt reporting difficulty sleeping  (Pended)     Patient is accompained by:  Family member  (Pended)  wife    Limitations  House hold activities;Walking  (Pended)     How long can you walk comfortably?  15 minutes  (Pended)     Currently in Pain?  Yes  (Pended)     Pain Score  2   (Pended)     Pain Location  Knee  (Pended)     Pain Orientation  Left  (Pended)     Pain Descriptors / Indicators   Aching  (Pended)     Pain Type  Surgical pain  (Pended)          [facility] PT Assessment - 12/03/17 0001      Assessment   Medical Diagnosis  left partial knee replacement    Referring Provider  Edmonia Lynch, MD    Onset Date/Surgical Date  11/06/17    Hand Dominance  Right    Prior Therapy  no      Precautions   Precautions  None      Restrictions   Weight Bearing Restrictions  No      Balance Screen   Has the patient fallen in the past 6 months  No    Is the patient reluctant to leave their home because of a fear of falling?   No      Home Film/video editor residence    Living Arrangements  Spouse/significant other    Available Help at Discharge  Family    Type of [place] to enter    Entrance Stairs-Number of Steps  [place]  One level    Independence - single point  Prior Function   Level of Independence  Independent    Leisure  fishing      Cognition   Overall Cognitive Status  Within Functional Limits for tasks assessed      Observation/Other Assessments   Focus on Therapeutic Outcomes (FOTO)   55% limitation      Posture/Postural Control   Posture/Postural Control  Postural limitations    Posture Comments  sciolosis      ROM / Strength   AROM / PROM / Strength  AROM;Strength      AROM   AROM Assessment Site  Knee    Right/Left Knee  Left    Left Knee Extension  0    Left Knee Flexion  122      Strength   Strength Assessment Site  Knee    Right/Left Knee  Left    Left Knee Flexion  4/5    Left Knee Extension  4/5      Palpation   Patella mobility  mild swelling around joint site limiting medial and lateral mobility    Palpation comment  tenderness over tibial tuberosity and incision site      Ambulation/Gait   Gait Comments  pt amb with a straight cane with antalgic gait pattern with decresaed weight shifting to the left and decreased step  length on the right.              Objective measurements completed on examination: See above findings.      [facility] Adult PT Treatment/Exercise - 12/03/17 0001      Exercises   Exercises  -- added hip abduction with theraband, and terminal knee extens      Modalities   Modalities  Cryotherapy;Electrical Stimulation      Cryotherapy   Number Minutes Cryotherapy  15 Minutes    Cryotherapy Location  Knee    Type of Cryotherapy  Ice pack      Electrical Stimulation   Electrical Stimulation Location  left knee    Electrical Stimulation Action  IFC    Electrical Stimulation Parameters  80-150 Hz x 15 minutes    Electrical Stimulation Goals  Edema;Pain                  PT Long Term Goals - 12/03/17 1312      PT LONG TERM GOAL #1   Title  Pt will improve his FOTO from 55% limitation to </= 35% limitation.     Time  6    Period  Weeks    Status  New    Target Date  01/14/18      PT LONG TERM GOAL #2   Title  pt will improve his R knee flexion strength to 5/5.     Time  6    Period  Weeks    Status  New    Target Date  01/14/18      PT LONG TERM GOAL #3   Title  Pt will amb with step through gait pattern with no device.     Time  6    Period  Weeks    Status  New    Target Date  01/14/18             Plan - 12/03/17 1309    Clinical Impression Statement  Pt presenting to therpay s/p left partial knee replacment on 11/06/17. Pt currently amb with a straight cane with antalgic gait pattern. Pt with mild weakness in left knee of 4/5  strength. Pt reporting pain at night of 5-6/10 and pain during the day of 1-3/10 depending on the activity. Pt would benefit from skilled PT to progress toward his PLOF.     History and Personal Factors relevant to plan of care:  sciolosis    Clinical Presentation  Stable    Clinical Decision Making  Low    Rehab Potential  Excellent    PT Frequency  2x / week    PT Duration  6 weeks    PT Treatment/Interventions   ADLs/Self Care Home Management;Electrical Stimulation;Cryotherapy;Ultrasound;Gait training;Stair training;Functional mobility training;Therapeutic activities;Therapeutic exercise;Balance training;Patient/family education;Neuromuscular re-education;Manual techniques;Passive range of motion;Taping    PT Next Visit Plan  Strengthening, ROM, balance, modalities to tolerance    Consulted and Agree with Plan of Care  Patient;Family member/caregiver       Patient will benefit from skilled therapeutic intervention in order to improve the following deficits and impairments:  Pain, Postural dysfunction, Decreased range of motion, Decreased activity tolerance, Decreased strength, Difficulty walking, Increased edema, Decreased mobility  Visit Diagnosis: Acute pain of left knee  Difficulty in walking, not elsewhere classified  G-Codes - 12/19/17 1317    Functional Assessment Tool Used (Outpatient Only)  Foto 55% limitiation at eval, clinical assesment/judgement    Functional Limitation  Mobility: Walking and moving around    Mobility: Walking and Moving Around Current Status [phone])  At least 40 percent but less than 60 percent impaired, limited or restricted    Mobility: Walking and Moving Around Goal Status [phone])  At least 20 percent but less than 40 percent impaired, limited or restricted        Problem List Patient Active Problem List   Diagnosis Date Noted  . Primary osteoarthritis of left knee 10/22/2017  . Gout 01/19/2017  . Chest pain 09/01/2014  . Coronary artery disease excluded 05/20/2012  . Hyperlipidemia 04/29/2011  . Essential hypertension 11/13/2007    Oretha Caprice, MPT 2017-12-19, 1:18 PM  [facility] [address] [place], [place], [address] Phone: [phone]   Fax:  [phone]  Name: [name] MRN: [id number] Date of Birth: [birth year]

## 2017-12-06 ENCOUNTER — Ambulatory Visit: Payer: Medicare Other | Admitting: *Deleted

## 2017-12-06 DIAGNOSIS — M25562 Pain in left knee: Secondary | ICD-10-CM

## 2017-12-06 DIAGNOSIS — R262 Difficulty in walking, not elsewhere classified: Secondary | ICD-10-CM

## 2017-12-06 NOTE — Therapy (Signed)
[facility] [place], [place], [address] Phone: [phone]   Fax:  [phone]  Physical Therapy Treatment  Patient Details  Name: [name] MRN: [id number] Date of Birth: 1946-04-12 Referring Provider: Edmonia Lynch, MD   Encounter Date: 12/06/2017  PT End of Session - 12/06/17 1001    Visit Number  2       Past Medical History:  Diagnosis Date  . Cellulitis and abscess of other specified site   . Colon polyps   . Gout   . Hypertension   . Neuromuscular disorder (HCC)    arthritis  . Scoliosis     Past Surgical History:  Procedure Laterality Date  . BACK SURGERY    . COLONOSCOPY  01/10/2013   Procedure: COLONOSCOPY;  Surgeon: Rogene Houston, MD;  [facility];  Service: Endoscopy;  Laterality: N/A;  730  . COLONOSCOPY N/A 08/21/2013   Procedure: COLONOSCOPY;  Surgeon: Rogene Houston, MD;  [facility];  Service: Endoscopy;  Laterality: N/A;  1030  . COLONOSCOPY N/A 08/26/2015   Procedure: COLONOSCOPY;  Surgeon: Rogene Houston, MD;  [facility];  Service: Endoscopy;  Laterality: N/A;  240  . colonscopy    . PARTIAL KNEE ARTHROPLASTY Left 11/06/2017   Procedure: UNICOMPARTMENTAL KNEE;  Surgeon: [name], MD;  Location: [facility];  Service: Orthopedics;  Laterality: Left;    There were no vitals filed for this visit.  Subjective Assessment - 12/06/17 0910    Subjective  Doing much better. Able to sleep longer now    Currently in Pain?  Yes    Pain Score  2     Pain Location  Knee    Pain Orientation  Left    Pain Descriptors / Indicators  Sore    Pain Type  Surgical pain                      [facility] Adult PT Treatment/Exercise - 12/06/17 0001      Exercises   Exercises  Knee/Hip  (Pended)       Knee/Hip Exercises: Standing   Forward Lunges  Left;20 reps  (Pended)  14in box    Rocker Board  3 minutes  (Pended)       Knee/Hip Exercises:  Seated   Long Arc Quad  2 sets;10 reps  (Pended)     Long Arc Quad Weight  3 lbs.  (Pended)       Modalities   Modalities  Cryotherapy;Electrical Stimulation;Vasopneumatic  (Pended)       Cryotherapy   Number Minutes Cryotherapy  --  (Pended)     Cryotherapy Location  --  (Pended)     Type of Cryotherapy  --  (Pended)       Acupuncturist Stimulation Location  left knee IFC 80-150hz  x 15 mins    Electrical Stimulation Goals  Edema;Pain      Vasopneumatic   Number Minutes Vasopneumatic   15 minutes  (Pended)     Vasopnuematic Location   Knee  (Pended)     Vasopneumatic Pressure  Medium  (Pended)     Vasopneumatic Temperature   34  (Pended)                   PT Long Term Goals - 12/03/17 1312      PT LONG TERM GOAL #1   Title  Pt will improve his FOTO  from 55% limitation to </= 35% limitation.     Time  6    Period  Weeks    Status  New    Target Date  01/14/18      PT LONG TERM GOAL #2   Title  pt will improve his R knee flexion strength to 5/5.     Time  6    Period  Weeks    Status  New    Target Date  01/14/18      PT LONG TERM GOAL #3   Title  Pt will amb with step through gait pattern with no device.     Time  6    Period  Weeks    Status  New    Target Date  01/14/18            Plan - 12/06/17 1001    Clinical Impression Statement  Pt arrived today ambulating with a SPC and doing fairly well. Pt reports sleeping a little better now and knee is moving better. He was able to perform all exs with minimal complaints.Normal respons to modalities.    Clinical Presentation  Stable    Clinical Decision Making  Low    Rehab Potential  Excellent    PT Frequency  2x / week    PT Duration  6 weeks    PT Treatment/Interventions  ADLs/Self Care Home Management;Electrical Stimulation;Cryotherapy;Ultrasound;Gait training;Stair training;Functional mobility training;Therapeutic activities;Therapeutic exercise;Balance  training;Patient/family education;Neuromuscular re-education;Manual techniques;Passive range of motion;Taping    PT Next Visit Plan  Strengthening, ROM, balance, modalities to tolerance    Consulted and Agree with Plan of Care  Patient       Patient will benefit from skilled therapeutic intervention in order to improve the following deficits and impairments:  Pain, Postural dysfunction, Decreased range of motion, Decreased activity tolerance, Decreased strength, Difficulty walking, Increased edema, Decreased mobility  Visit Diagnosis: Acute pain of left knee  Difficulty in walking, not elsewhere classified     Problem List Patient Active Problem List   Diagnosis Date Noted  . Primary osteoarthritis of left knee 10/22/2017  . Gout 01/19/2017  . Chest pain 09/01/2014  . Coronary artery disease excluded 05/20/2012  . Hyperlipidemia 04/29/2011  . Essential hypertension 11/13/2007    Dalyah Pla,CHRIS, PTA 12/06/2017, 10:13 AM  [facility] [address] [place], [place], [address] Phone: [phone]   Fax:  [phone]  Name: [name] MRN: [id number] Date of Birth: 06-29-1946

## 2017-12-12 ENCOUNTER — Ambulatory Visit: Payer: Medicare Other | Admitting: Physical Therapy

## 2017-12-12 DIAGNOSIS — M25562 Pain in left knee: Secondary | ICD-10-CM

## 2017-12-12 DIAGNOSIS — R262 Difficulty in walking, not elsewhere classified: Secondary | ICD-10-CM | POA: Diagnosis not present

## 2017-12-12 NOTE — Therapy (Signed)
[facility] [place], [place], [address] Phone: [phone]   Fax:  [phone]  Physical Therapy Treatment  Patient Details  Name: [name] MRN: [id number] Date of Birth: 03-Aug-1946 Referring Provider: Edmonia Lynch, MD   Encounter Date: 12/12/2017  PT End of Session - 12/12/17 0955    Visit Number  3    PT Start Time  0945    PT Stop Time  1020 Patient denied modalities    PT Time Calculation (min)  35 min    Activity Tolerance  Patient tolerated treatment well    Behavior During Therapy  [facility] for tasks assessed/performed       Past Medical History:  Diagnosis Date  . Cellulitis and abscess of other specified site   . Colon polyps   . Gout   . Hypertension   . Neuromuscular disorder (HCC)    arthritis  . Scoliosis     Past Surgical History:  Procedure Laterality Date  . BACK SURGERY    . COLONOSCOPY  01/10/2013   Procedure: COLONOSCOPY;  Surgeon: Rogene Houston, MD;  [facility];  Service: Endoscopy;  Laterality: N/A;  730  . COLONOSCOPY N/A 08/21/2013   Procedure: COLONOSCOPY;  Surgeon: Rogene Houston, MD;  [facility];  Service: Endoscopy;  Laterality: N/A;  1030  . COLONOSCOPY N/A 08/26/2015   Procedure: COLONOSCOPY;  Surgeon: Rogene Houston, MD;  [facility];  Service: Endoscopy;  Laterality: N/A;  240  . colonscopy    . PARTIAL KNEE ARTHROPLASTY Left 11/06/2017   Procedure: UNICOMPARTMENTAL KNEE;  Surgeon: [name], MD;  Location: [facility];  Service: Orthopedics;  Laterality: Left;    There were no vitals filed for this visit.  Subjective Assessment - 12/12/17 0955    Subjective  Reports only L knee pain with end range flexion but eager to get back to finishing. Reports no real limitations and still completing HEP and walking program several times daily.    Currently in Pain?  No/denies         [facility] PT Assessment - 12/12/17 0001      Assessment    Medical Diagnosis  left partial knee replacement    Onset Date/Surgical Date  11/06/17    Hand Dominance  Right    Next MD Visit  12/19/2016    Prior Therapy  no      Precautions   Precautions  None      Restrictions   Weight Bearing Restrictions  No                  [facility] Adult PT Treatment/Exercise - 12/12/17 0001      Knee/Hip Exercises: Aerobic   Stationary Bike  L3, seat 5 x10 min      Knee/Hip Exercises: Machines for Strengthening   Cybex Knee Extension  10# 3x10 reps    Cybex Knee Flexion  30-40# 3x10 reps    Cybex Leg Press  2 pl, seat 5 x30 reps      Knee/Hip Exercises: Supine   Straight Leg Raises  Strengthening;Left;20 reps    Straight Leg Raise with External Rotation  Strengthening;Left;20 reps      Knee/Hip Exercises: Sidelying   Hip ABduction  Strengthening;Left;15 reps                  PT Long Term Goals - 12/03/17 1312      PT LONG TERM GOAL #1  Title  Pt will improve his FOTO from 55% limitation to </= 35% limitation.     Time  6    Period  Weeks    Status  New    Target Date  01/14/18      PT LONG TERM GOAL #2   Title  pt will improve his R knee flexion strength to 5/5.     Time  6    Period  Weeks    Status  New    Target Date  01/14/18      PT LONG TERM GOAL #3   Title  Pt will amb with step through gait pattern with no device.     Time  6    Period  Weeks    Status  New    Target Date  01/14/18            Plan - 12/12/17 1028    Clinical Impression Statement  Patient continues to excel following L partiall knee replacement. Patient able to tolerate resisted stationary bike with no reports of pain. Patient introduced to machine knee strengthening exercises with only minimal VCs and demo for proper technique. Minimal fatigue and weakness noted with SLR with ER. Patient reported dilligence with walking program and HEP. Normal modalities response noted following removal of the modalities. Patient denied modalities  as he ices several times a day per patient report.    Rehab Potential  Excellent    PT Frequency  2x / week    PT Duration  6 weeks    PT Treatment/Interventions  ADLs/Self Care Home Management;Electrical Stimulation;Cryotherapy;Ultrasound;Gait training;Stair training;Functional mobility training;Therapeutic activities;Therapeutic exercise;Balance training;Patient/family education;Neuromuscular re-education;Manual techniques;Passive range of motion;Taping    PT Next Visit Plan  Strengthening, ROM, balance, modalities to tolerance    Consulted and Agree with Plan of Care  Patient       Patient will benefit from skilled therapeutic intervention in order to improve the following deficits and impairments:  Pain, Postural dysfunction, Decreased range of motion, Decreased activity tolerance, Decreased strength, Difficulty walking, Increased edema, Decreased mobility  Visit Diagnosis: Acute pain of left knee  Difficulty in walking, not elsewhere classified     Problem List Patient Active Problem List   Diagnosis Date Noted  . Primary osteoarthritis of left knee 10/22/2017  . Gout 01/19/2017  . Chest pain 09/01/2014  . Coronary artery disease excluded 05/20/2012  . Hyperlipidemia 04/29/2011  . Essential hypertension 11/13/2007    Standley Brooking, PTA 12/12/2017, 10:39 AM  [facility] [address] [place], [place], [address] Phone: [phone]   Fax:  [phone]  Name: DIMITRIS SHANAHAN MRN: [id number] Date of Birth: 04-Jun-1946

## 2017-12-13 ENCOUNTER — Ambulatory Visit: Payer: Medicare Other | Admitting: Physical Therapy

## 2017-12-13 ENCOUNTER — Encounter: Payer: Self-pay | Admitting: Physical Therapy

## 2017-12-13 DIAGNOSIS — R262 Difficulty in walking, not elsewhere classified: Secondary | ICD-10-CM

## 2017-12-13 DIAGNOSIS — M25562 Pain in left knee: Secondary | ICD-10-CM

## 2017-12-13 NOTE — Therapy (Signed)
[facility] [place], [place], [address] Phone: [phone]   Fax:  [phone]  Physical Therapy Treatment  Patient Details  Name: [name] MRN: [id number] Date of Birth: 02/13/1946 Referring Provider: Edmonia Lynch, MD   Encounter Date: 12/13/2017  PT End of Session - 12/13/17 0736    Visit Number  4    PT Start Time  0730    PT Stop Time  0813    PT Time Calculation (min)  43 min    Activity Tolerance  Patient tolerated treatment well    Behavior During Therapy  [facility] for tasks assessed/performed       Past Medical History:  Diagnosis Date  . Cellulitis and abscess of other specified site   . Colon polyps   . Gout   . Hypertension   . Neuromuscular disorder (HCC)    arthritis  . Scoliosis     Past Surgical History:  Procedure Laterality Date  . BACK SURGERY    . COLONOSCOPY  01/10/2013   Procedure: COLONOSCOPY;  Surgeon: Rogene Houston, MD;  [facility];  Service: Endoscopy;  Laterality: N/A;  730  . COLONOSCOPY N/A 08/21/2013   Procedure: COLONOSCOPY;  Surgeon: Rogene Houston, MD;  [facility];  Service: Endoscopy;  Laterality: N/A;  1030  . COLONOSCOPY N/A 08/26/2015   Procedure: COLONOSCOPY;  Surgeon: Rogene Houston, MD;  [facility];  Service: Endoscopy;  Laterality: N/A;  240  . colonscopy    . PARTIAL KNEE ARTHROPLASTY Left 11/06/2017   Procedure: UNICOMPARTMENTAL KNEE;  Surgeon: [name], MD;  Location: [facility];  Service: Orthopedics;  Laterality: Left;    There were no vitals filed for this visit.  Subjective Assessment - 12/13/17 0734    Subjective  Reports a little soreness.    Currently in Pain?  Yes    Pain Score  1     Pain Location  Knee    Pain Orientation  Left    Pain Descriptors / Indicators  Sore    Pain Type  Surgical pain         [facility] PT Assessment - 12/13/17 0001      Assessment   Medical Diagnosis  left partial knee  replacement    Onset Date/Surgical Date  11/06/17    Hand Dominance  Right    Next MD Visit  12/19/2016    Prior Therapy  no      Precautions   Precautions  None      Restrictions   Weight Bearing Restrictions  No                  [facility] Adult PT Treatment/Exercise - 12/13/17 0001      Knee/Hip Exercises: Aerobic   Stationary Bike  L3, seat 6 x15 min      Knee/Hip Exercises: Machines for Strengthening   Cybex Knee Extension  10# 3x10 reps    Cybex Knee Flexion  40# 3x10 reps    Cybex Leg Press  2 pl, seat 5 x20 reps LLE only      Knee/Hip Exercises: Standing   Terminal Knee Extension  Strengthening;Left;20 reps Pink XTS with 5 sec hold    Step Down  Left;2 sets;10 reps;Hand Hold: 2;Step Height: 4" Heel dot    Wall Squat  20 reps    SLS  x2 reps 1 min each with intermittant R toe touch and UE assist  Knee/Hip Exercises: Supine   Bridges  Strengthening;Both;15 reps 5 sec holds    Straight Leg Raises  Strengthening;Left;20 reps    Straight Leg Raise with External Rotation  Strengthening;Left;20 reps      Knee/Hip Exercises: Sidelying   Hip ABduction  Strengthening;Left;20 reps                  PT Long Term Goals - 12/03/17 1312      PT LONG TERM GOAL #1   Title  Pt will improve his FOTO from 55% limitation to </= 35% limitation.     Time  6    Period  Weeks    Status  New    Target Date  01/14/18      PT LONG TERM GOAL #2   Title  pt will improve his R knee flexion strength to 5/5.     Time  6    Period  Weeks    Status  New    Target Date  01/14/18      PT LONG TERM GOAL #3   Title  Pt will amb with step through gait pattern with no device.     Time  6    Period  Weeks    Status  New    Target Date  01/14/18            Plan - 12/13/17 9326    Clinical Impression Statement  Patient continues to tolerate treatment well with only minimal L knee soreness. Patient tolerated more advanced exercises well without complaint and with  prolonged holds for increased strengthening. LLE SLS exercise initated with minimal instability to which patient correlated to previous concussion in 1970. Minimal weakness noted with L SLR with ER due to minimal extensor lag noted as exercise progressed. Following treatment patient only reported experiencing soreness in L anterior hip/groin region.    Rehab Potential  Excellent    PT Frequency  2x / week    PT Duration  6 weeks    PT Treatment/Interventions  ADLs/Self Care Home Management;Electrical Stimulation;Cryotherapy;Ultrasound;Gait training;Stair training;Functional mobility training;Therapeutic activities;Therapeutic exercise;Balance training;Patient/family education;Neuromuscular re-education;Manual techniques;Passive range of motion;Taping    PT Next Visit Plan  Strengthening, ROM, balance, modalities to tolerance    Consulted and Agree with Plan of Care  Patient       Patient will benefit from skilled therapeutic intervention in order to improve the following deficits and impairments:  Pain, Postural dysfunction, Decreased range of motion, Decreased activity tolerance, Decreased strength, Difficulty walking, Increased edema, Decreased mobility  Visit Diagnosis: Acute pain of left knee  Difficulty in walking, not elsewhere classified     Problem List Patient Active Problem List   Diagnosis Date Noted  . Primary osteoarthritis of left knee 10/22/2017  . Gout 01/19/2017  . Chest pain 09/01/2014  . Coronary artery disease excluded 05/20/2012  . Hyperlipidemia 04/29/2011  . Essential hypertension 11/13/2007    Standley Brooking, PTA 12/13/2017, 8:17 AM  [facility] [address] [place], [place], [address] Phone: [phone]   Fax:  [phone]  Name: [name] MRN: [id number] Date of Birth: July 20, 1946

## 2017-12-17 ENCOUNTER — Ambulatory Visit: Payer: Medicare Other | Admitting: Physical Therapy

## 2017-12-17 ENCOUNTER — Encounter: Payer: Self-pay | Admitting: Physical Therapy

## 2017-12-17 DIAGNOSIS — M25562 Pain in left knee: Secondary | ICD-10-CM | POA: Diagnosis not present

## 2017-12-17 DIAGNOSIS — R262 Difficulty in walking, not elsewhere classified: Secondary | ICD-10-CM

## 2017-12-17 NOTE — Therapy (Addendum)
[facility] [place], [place], [address] Phone: [phone]   Fax:  [phone]  Physical Therapy Treatment  Patient Details  Name: [name] MRN: [id number] Date of Birth: 08-12-1946 Referring Provider: Edmonia Lynch, MD   Encounter Date: 12/17/2017  PT End of Session - 12/17/17 1131    Visit Number  5    PT Start Time  7579    PT Stop Time  1157    PT Time Calculation (min)  41 min    Activity Tolerance  Patient tolerated treatment well    Behavior During Therapy  [facility] for tasks assessed/performed       Past Medical History:  Diagnosis Date  . Cellulitis and abscess of other specified site   . Colon polyps   . Gout   . Hypertension   . Neuromuscular disorder (HCC)    arthritis  . Scoliosis     Past Surgical History:  Procedure Laterality Date  . BACK SURGERY    . COLONOSCOPY  01/10/2013   Procedure: COLONOSCOPY;  Surgeon: Rogene Houston, MD;  [facility];  Service: Endoscopy;  Laterality: N/A;  730  . COLONOSCOPY N/A 08/21/2013   Procedure: COLONOSCOPY;  Surgeon: Rogene Houston, MD;  [facility];  Service: Endoscopy;  Laterality: N/A;  1030  . COLONOSCOPY N/A 08/26/2015   Procedure: COLONOSCOPY;  Surgeon: Rogene Houston, MD;  [facility];  Service: Endoscopy;  Laterality: N/A;  240  . colonscopy    . PARTIAL KNEE ARTHROPLASTY Left 11/06/2017   Procedure: UNICOMPARTMENTAL KNEE;  Surgeon: [name], MD;  Location: [facility];  Service: Orthopedics;  Laterality: Left;    There were no vitals filed for this visit.  Subjective Assessment - 12/17/17 1130    Subjective  Reports very little soreness but only at end range flexion and reports more discomfort at night.    Currently in Pain?  No/denies         [facility] PT Assessment - 12/17/17 0001      Assessment   Medical Diagnosis  left partial knee replacement    Onset Date/Surgical Date  11/06/17    Hand  Dominance  Right    Next MD Visit  12/19/2016    Prior Therapy  no      Precautions   Precautions  None      Restrictions   Weight Bearing Restrictions  No      ROM / Strength   AROM / PROM / Strength  Strength      Strength   Overall Strength  Within functional limits for tasks performed    Strength Assessment Site  Knee    Right/Left Knee  Left    Left Knee Flexion  4+/5    Left Knee Extension  5/5                  [facility] Adult PT Treatment/Exercise - 12/17/17 0001      Knee/Hip Exercises: Aerobic   Stationary Bike  L4 x13 min    Elliptical  L5, R5 x5 min      Knee/Hip Exercises: Machines for Strengthening   Cybex Knee Extension  20# 3x10 reps    Cybex Knee Flexion  40# 3x10 reps    Cybex Leg Press  2 pl, seat 5 x30 reps BLE      Knee/Hip Exercises: Standing   Terminal Knee Extension  Strengthening;Left;Other (comment) x30 reps Pink XTS  Step Down  Left;3 sets;10 reps;Hand Hold: 2;Step Height: 4"    Wall Squat  20 reps      Knee/Hip Exercises: Supine   Straight Leg Raise with External Rotation  Strengthening;Left;20 reps      Knee/Hip Exercises: Sidelying   Hip ABduction  Strengthening;Left;20 reps                  PT Long Term Goals - 12/17/17 1149      PT LONG TERM GOAL #1   Title  Pt will improve his FOTO from 55% limitation to </= 35% limitation.     Time  6    Period  Weeks    Status  Achieved      PT LONG TERM GOAL #2   Title  pt will improve his R knee flexion strength to 5/5.     Time  6    Period  Weeks    Status  Partially Met 4+/5 L knee flexion MMT 12/17/2017      PT LONG TERM GOAL #3   Title  Pt will amb with step through gait pattern with no device.     Time  6    Period  Weeks    Status  Achieved            Plan - 12/17/17 1159    Clinical Impression Statement  Patient has progressed very well following a partial L knee replacement. Patient denies any limitation in his daily life other than fatigue. No  complaint provided by patient during treatment and able to tolerate increased reps and resistance. Patient did experience greater fatigue following elliptical training. Patient denied any modalities today. Patient able to achieve or partailly achieve all goals set at evaluation.     Rehab Potential  Excellent    PT Frequency  2x / week    PT Duration  6 weeks    PT Treatment/Interventions  ADLs/Self Care Home Management;Electrical Stimulation;Cryotherapy;Ultrasound;Gait training;Stair training;Functional mobility training;Therapeutic activities;Therapeutic exercise;Balance training;Patient/family education;Neuromuscular re-education;Manual techniques;Passive range of motion;Taping    PT Next Visit Plan  Continue or D/C per MD discretion.    Consulted and Agree with Plan of Care  Patient       Patient will benefit from skilled therapeutic intervention in order to improve the following deficits and impairments:  Pain, Postural dysfunction, Decreased range of motion, Decreased activity tolerance, Decreased strength, Difficulty walking, Increased edema, Decreased mobility  Visit Diagnosis: Acute pain of left knee  Difficulty in walking, not elsewhere classified  PHYSICAL THERAPY DISCHARGE SUMMARY  Visits from Start of Care: 5  Current functional level related to goals / functional outcomes: see above    Remaining deficits: see above   Education / Equipment: HEP  Plan: Patient agrees to discharge.  Patient goals were partially met. Patient is being discharged due to not returning since the last visit.  ?????        Problem List Patient Active Problem List   Diagnosis Date Noted  . Primary osteoarthritis of left knee 10/22/2017  . Gout 01/19/2017  . Chest pain 09/01/2014  . Coronary artery disease excluded 05/20/2012  . Hyperlipidemia 04/29/2011  . Essential hypertension 11/13/2007    Standley Brooking, PTA 12/17/17 12:07 PM Kearney Hard, PT 09/30/20 12:59  PM     [facility] [address] [place], [place], [address] Phone: [phone]   Fax:  [phone]  Name: [name] MRN: [id number] Date of Birth: 1946/01/18

## 2017-12-19 DIAGNOSIS — M1712 Unilateral primary osteoarthritis, left knee: Secondary | ICD-10-CM | POA: Diagnosis not present

## 2018-01-09 ENCOUNTER — Other Ambulatory Visit: Payer: Self-pay | Admitting: Family Medicine

## 2018-01-17 ENCOUNTER — Encounter: Payer: Self-pay | Admitting: Family Medicine

## 2018-01-17 ENCOUNTER — Other Ambulatory Visit: Payer: Self-pay

## 2018-01-17 ENCOUNTER — Ambulatory Visit (INDEPENDENT_AMBULATORY_CARE_PROVIDER_SITE_OTHER): Payer: Medicare Other | Admitting: Family Medicine

## 2018-01-17 VITALS — BP 120/63 | HR 63 | Temp 98.1°F | Resp 16 | Ht 72.0 in | Wt 207.4 lb

## 2018-01-17 DIAGNOSIS — E785 Hyperlipidemia, unspecified: Secondary | ICD-10-CM | POA: Diagnosis not present

## 2018-01-17 DIAGNOSIS — I10 Essential (primary) hypertension: Secondary | ICD-10-CM

## 2018-01-17 LAB — CBC WITH DIFFERENTIAL/PLATELET
Basophils Absolute: 0.1 K/uL (ref 0.0–0.1)
Basophils Relative: 0.7 % (ref 0.0–3.0)
Eosinophils Absolute: 0.2 K/uL (ref 0.0–0.7)
Eosinophils Relative: 2.3 % (ref 0.0–5.0)
HCT: 42.4 % (ref 39.0–52.0)
Hemoglobin: 13.7 g/dL (ref 13.0–17.0)
Lymphocytes Relative: 20.4 % (ref 12.0–46.0)
Lymphs Abs: 1.6 K/uL (ref 0.7–4.0)
MCHC: 32.2 g/dL (ref 30.0–36.0)
MCV: 86.7 fl (ref 78.0–100.0)
Monocytes Absolute: 0.7 K/uL (ref 0.1–1.0)
Monocytes Relative: 8.4 % (ref 3.0–12.0)
Neutro Abs: 5.3 K/uL (ref 1.4–7.7)
Neutrophils Relative %: 68.2 % (ref 43.0–77.0)
Platelets: 213 K/uL (ref 150.0–400.0)
RBC: 4.9 Mil/uL (ref 4.22–5.81)
RDW: 13.8 % (ref 11.5–15.5)
WBC: 7.8 K/uL (ref 4.0–10.5)

## 2018-01-17 LAB — HEPATIC FUNCTION PANEL
ALBUMIN: 4.1 g/dL (ref 3.5–5.2)
ALK PHOS: 87 U/L (ref 39–117)
ALT: 24 U/L (ref 0–53)
AST: 16 U/L (ref 0–37)
Bilirubin, Direct: 0.1 mg/dL (ref 0.0–0.3)
TOTAL PROTEIN: 6.5 g/dL (ref 6.0–8.3)
Total Bilirubin: 0.7 mg/dL (ref 0.2–1.2)

## 2018-01-17 LAB — LIPID PANEL
Cholesterol: 118 mg/dL (ref 0–200)
HDL: 28.2 mg/dL — ABNORMAL LOW (ref >39.00)
LDL Cholesterol: 52 mg/dL (ref 0–99)
NonHDL: 89.72
Total CHOL/HDL Ratio: 4
Triglycerides: 187 mg/dL — ABNORMAL HIGH (ref 0.0–149.0)
VLDL: 37.4 mg/dL (ref 0.0–40.0)

## 2018-01-17 LAB — BASIC METABOLIC PANEL WITH GFR
BUN: 20 mg/dL (ref 6–23)
CO2: 31 meq/L (ref 19–32)
Calcium: 9.3 mg/dL (ref 8.4–10.5)
Chloride: 104 meq/L (ref 96–112)
Creatinine, Ser: 1.3 mg/dL (ref 0.40–1.50)
GFR: 58.15 mL/min — ABNORMAL LOW (ref >60.00)
Glucose, Bld: 91 mg/dL (ref 70–99)
Potassium: 4.5 meq/L (ref 3.5–5.1)
Sodium: 141 meq/L (ref 135–145)

## 2018-01-17 LAB — TSH: TSH: 1.49 u[IU]/mL (ref 0.35–4.50)

## 2018-01-17 NOTE — Assessment & Plan Note (Signed)
Chronic problem.  Tolerating statin w/o difficulty.  Check labs.  Adjust meds prn  

## 2018-01-17 NOTE — Progress Notes (Signed)
   Subjective:    Patient ID: [name], male    DOB: 1946/06/25, 72 y.o.   MRN: [id number]  HPI HTN- chronic problem, on Lisinopril HCTZ and metoprolol daily.  Denies CP, SOB, HAs, visual changes, edema.  Exercising regularly.  Hyperlipidemia- chronic problem, on Simvastatin 40mg  daily.  Denies abd pain, N/V.   Review of Systems For ROS see HPI     Objective:   Physical Exam  Constitutional: He is oriented to person, place, and time. He appears well-developed and well-nourished. No distress.  HENT:  Head: Normocephalic and atraumatic.  Eyes: Conjunctivae and EOM are normal. Pupils are equal, round, and reactive to light.  Neck: Normal range of motion. Neck supple. No thyromegaly present.  Cardiovascular: Normal rate, regular rhythm, normal heart sounds and intact distal pulses.  No murmur heard. Pulmonary/Chest: Effort normal and breath sounds normal. No respiratory distress.  Abdominal: Soft. Bowel sounds are normal. He exhibits no distension.  Musculoskeletal: He exhibits no edema.  Lymphadenopathy:    He has no cervical adenopathy.  Neurological: He is alert and oriented to person, place, and time. No cranial nerve deficit.  Skin: Skin is warm and dry.  Psychiatric: He has a normal mood and affect. His behavior is normal.  Vitals reviewed.         Assessment & Plan:

## 2018-01-17 NOTE — Assessment & Plan Note (Signed)
Chronic problem.  Well controlled today.  Asymptomatic.  Check labs.  No anticipated med changes.  Will follow. 

## 2018-01-17 NOTE — Patient Instructions (Signed)
Follow up in 6 months to recheck BP and cholesterol We'll notify you of your lab results and make any changes if needed Continue to work on healthy diet and regular exercise- you're doing great! Call with any questions or concerns Happy New Year!!!

## 2018-01-18 ENCOUNTER — Ambulatory Visit: Payer: Medicare Other | Admitting: Family Medicine

## 2018-01-18 ENCOUNTER — Other Ambulatory Visit: Payer: Self-pay | Admitting: Family Medicine

## 2018-01-18 DIAGNOSIS — R7989 Other specified abnormal findings of blood chemistry: Secondary | ICD-10-CM

## 2018-01-24 ENCOUNTER — Other Ambulatory Visit: Payer: Medicare Other

## 2018-01-25 ENCOUNTER — Other Ambulatory Visit: Payer: Medicare Other

## 2018-01-25 ENCOUNTER — Other Ambulatory Visit (INDEPENDENT_AMBULATORY_CARE_PROVIDER_SITE_OTHER): Payer: Medicare Other

## 2018-01-25 DIAGNOSIS — R7989 Other specified abnormal findings of blood chemistry: Secondary | ICD-10-CM | POA: Diagnosis not present

## 2018-01-25 LAB — BASIC METABOLIC PANEL
BUN: 19 mg/dL (ref 6–23)
CHLORIDE: 103 meq/L (ref 96–112)
CO2: 29 meq/L (ref 19–32)
CREATININE: 1.26 mg/dL (ref 0.40–1.50)
Calcium: 9.1 mg/dL (ref 8.4–10.5)
GFR: 60.28 mL/min (ref >60.00)
Glucose, Bld: 110 mg/dL — ABNORMAL HIGH (ref 70–99)
Potassium: 3.7 mEq/L (ref 3.5–5.1)
SODIUM: 138 meq/L (ref 135–145)

## 2018-02-01 DIAGNOSIS — M4156 Other secondary scoliosis, lumbar region: Secondary | ICD-10-CM | POA: Diagnosis not present

## 2018-02-01 DIAGNOSIS — M4726 Other spondylosis with radiculopathy, lumbar region: Secondary | ICD-10-CM | POA: Diagnosis not present

## 2018-02-01 DIAGNOSIS — M48062 Spinal stenosis, lumbar region with neurogenic claudication: Secondary | ICD-10-CM | POA: Diagnosis not present

## 2018-02-01 DIAGNOSIS — M5136 Other intervertebral disc degeneration, lumbar region: Secondary | ICD-10-CM | POA: Diagnosis not present

## 2018-02-01 DIAGNOSIS — M545 Low back pain: Secondary | ICD-10-CM | POA: Diagnosis not present

## 2018-02-01 DIAGNOSIS — M5416 Radiculopathy, lumbar region: Secondary | ICD-10-CM | POA: Diagnosis not present

## 2018-02-24 ENCOUNTER — Other Ambulatory Visit: Payer: Self-pay | Admitting: Cardiovascular Disease

## 2018-02-26 ENCOUNTER — Other Ambulatory Visit: Payer: Self-pay | Admitting: Cardiovascular Disease

## 2018-03-25 ENCOUNTER — Other Ambulatory Visit: Payer: Self-pay

## 2018-03-25 ENCOUNTER — Ambulatory Visit (INDEPENDENT_AMBULATORY_CARE_PROVIDER_SITE_OTHER): Payer: Medicare Other | Admitting: Physician Assistant

## 2018-03-25 ENCOUNTER — Other Ambulatory Visit: Payer: Self-pay | Admitting: Cardiovascular Disease

## 2018-03-25 ENCOUNTER — Encounter: Payer: Self-pay | Admitting: Physician Assistant

## 2018-03-25 VITALS — BP 112/60 | HR 63 | Temp 98.2°F | Resp 16 | Ht 72.0 in | Wt 208.0 lb

## 2018-03-25 DIAGNOSIS — B356 Tinea cruris: Secondary | ICD-10-CM | POA: Diagnosis not present

## 2018-03-25 MED ORDER — BUTENAFINE HCL 1 % EX CREA: 1.0000 "application " | TOPICAL_CREAM | Freq: Every day | CUTANEOUS | 0 refills | Status: DC

## 2018-03-25 MED ORDER — EPINEPHRINE 0.3 MG/0.3ML IJ SOAJ
0.3000 mg | Freq: Once | INTRAMUSCULAR | 0 refills | Status: AC
Start: 2018-03-25 — End: 2018-03-25

## 2018-03-25 MED ORDER — BUTENAFINE HCL 1 % EX CREA: 1.0000 "application " | TOPICAL_CREAM | Freq: Two times a day (BID) | CUTANEOUS | 0 refills | Status: DC

## 2018-03-25 NOTE — Progress Notes (Signed)
Patient presents to clinic today c/o rash noted on inguinal folds bilaterally x 1 day. Is unsure of how long it has been present. Is not painful. No significant pruritus noted presently. Has applied some Bactroban to the area. Notes rash is shiny. Denies fever, chills, malaise or fatigue. Denies concern for STI. Denies penile or testicular symptoms.   Past Medical History:  Diagnosis Date  . Cellulitis and abscess of other specified site   . Colon polyps   . Gout   . Hypertension   . Neuromuscular disorder (HCC)    arthritis  . Scoliosis     Current Outpatient Medications on File Prior to Visit  Medication Sig Dispense Refill  . allopurinol (ZYLOPRIM) 100 MG tablet TAKE 1 TABLET BY MOUTH EVERY DAY 90 tablet 1  . aspirin EC 325 MG tablet Take 1 tablet (325 mg total) by mouth daily. For 30 days post op for DVT Prophylaxis 30 tablet 0  . diclofenac (VOLTAREN) 75 MG EC tablet TAKE 1 TABLET (75 MG TOTAL) BY MOUTH 2 (TWO) TIMES DAILY AS NEEDED.  3  . docusate sodium (COLACE) 100 MG capsule Take 1 capsule (100 mg total) by mouth 2 (two) times daily. To prevent constipation while taking pain medication. 60 capsule 0  . EPIPEN 2-PAK 0.3 MG/0.3ML SOAJ injection Inject 0.3 mg into the muscle once.   3  . gabapentin (NEURONTIN) 300 MG capsule Take 300 mg by mouth 3 (three) times daily.     Marland Kitchen HYDROcodone-acetaminophen (NORCO/VICODIN) 5-325 MG tablet Take 1 tablet by mouth every 6 (six) hours as needed. for pain  0  . lisinopril-hydrochlorothiazide (PRINZIDE,ZESTORETIC) 10-12.5 MG tablet TAKE 1 TABLET BY MOUTH EVERY DAY 15 tablet 0  . metoprolol succinate (TOPROL-XL) 25 MG 24 hr tablet Take 1 tablet (25 mg total) by mouth daily. 90 tablet 1  . simvastatin (ZOCOR) 40 MG tablet Take 1 tablet (40 mg total) by mouth daily. 90 tablet 0   No current facility-administered medications on file prior to visit.     Allergies  Allergen Reactions  . Bee Venom Anaphylaxis    Family History  Problem  Relation Age of Onset  . Cancer Other        Family Hx of Cancer, CAD,Diabetes,Kidney Failure  . Colon cancer Neg Hx     Social History   Socioeconomic History  . Marital status: Married    Spouse name: Not on file  . Number of children: Not on file  . Years of education: Not on file  . Highest education level: Not on file  Occupational History  . Occupation: Fish farm manager  . Financial resource strain: Not on file  . Food insecurity:    Worry: Not on file    Inability: Not on file  . Transportation needs:    Medical: Not on file    Non-medical: Not on file  Tobacco Use  . Smoking status: Former Smoker    Packs/day: 3.00    Years: 30.00    Pack years: 90.00    Types: Cigarettes    Start date: 12/18/1957    Last attempt to quit: 12/18/1996    Years since quitting: 21.2  . Smokeless tobacco: Never Used  . Tobacco comment: started smoking as a child  Substance and Sexual Activity  . Alcohol use: Yes    Alcohol/week: 0.0 oz    Comment: beer occasionally  . Drug use: No  . Sexual activity: Not on file  Lifestyle  . Physical activity:  Days per week: Not on file    Minutes per session: Not on file  . Stress: Not on file  Relationships  . Social connections:    Talks on phone: Not on file    Gets together: Not on file    Attends religious service: Not on file    Active member of club or organization: Not on file    Attends meetings of clubs or organizations: Not on file    Relationship status: Not on file  Other Topics Concern  . Not on file  Social History Narrative  . Not on file    Review of Systems - See HPI.  All other ROS are negative.  BP 112/60   Pulse 63   Temp 98.2 F (36.8 C) (Oral)   Resp 16   Ht 6' (1.829 m)   Wt 208 lb (94.3 kg)   SpO2 98%   BMI 28.21 kg/m   Physical Exam  Constitutional: He appears well-developed and well-nourished.  HENT:  Head: Normocephalic and atraumatic.  Pulmonary/Chest: Effort normal.  Neurological: He  is alert.  Skin: Skin is warm and dry. Rash (erythematous, shiny rash of inguinal folds noted with R > L, seeming consistent with tinea) noted.  Vitals reviewed.   Recent Results (from the past 2160 hour(s))  Lipid panel     Status: Abnormal   Collection Time: 01/17/18 11:08 AM  Result Value Ref Range   Cholesterol 118 0 - 200 mg/dL    Comment: ATP III Classification       Desirable:  < 200 mg/dL               Borderline High:  200 - 239 mg/dL          High:  > = 240 mg/dL   Triglycerides 187.0 (H) 0.0 - 149.0 mg/dL    Comment: Normal:  <150 mg/dLBorderline High:  150 - 199 mg/dL   HDL 28.20 (L) >39.00 mg/dL   VLDL 37.4 0.0 - 40.0 mg/dL   LDL Cholesterol 52 0 - 99 mg/dL   Total CHOL/HDL Ratio 4     Comment:                Men          Women1/2 Average Risk     3.4          3.3Average Risk          5.0          4.42X Average Risk          9.6          7.13X Average Risk          15.0          11.0                       NonHDL 89.72     Comment: NOTE:  Non-HDL goal should be 30 mg/dL higher than patient's LDL goal (i.e. LDL goal of < 70 mg/dL, would have non-HDL goal of < 100 mg/dL)  Basic metabolic panel     Status: Abnormal   Collection Time: 01/17/18 11:08 AM  Result Value Ref Range   Sodium 141 135 - 145 mEq/L   Potassium 4.5 3.5 - 5.1 mEq/L   Chloride 104 96 - 112 mEq/L   CO2 31 19 - 32 mEq/L   Glucose, Bld 91 70 - 99 mg/dL   BUN 20 6 - 23 mg/dL  Creatinine, Ser 1.30 0.40 - 1.50 mg/dL   Calcium 9.3 8.4 - 10.5 mg/dL   GFR 58.15 (L) >60.00 mL/min  Hepatic function panel     Status: None   Collection Time: 01/17/18 11:08 AM  Result Value Ref Range   Total Bilirubin 0.7 0.2 - 1.2 mg/dL   Bilirubin, Direct 0.1 0.0 - 0.3 mg/dL   Alkaline Phosphatase 87 39 - 117 U/L   AST 16 0 - 37 U/L   ALT 24 0 - 53 U/L   Total Protein 6.5 6.0 - 8.3 g/dL   Albumin 4.1 3.5 - 5.2 g/dL  CBC with Differential/Platelet     Status: None   Collection Time: 01/17/18 11:08 AM  Result Value Ref Range    WBC 7.8 4.0 - 10.5 K/uL   RBC 4.90 4.22 - 5.81 Mil/uL   Hemoglobin 13.7 13.0 - 17.0 g/dL   HCT 42.4 39.0 - 52.0 %   MCV 86.7 78.0 - 100.0 fl   MCHC 32.2 30.0 - 36.0 g/dL   RDW 13.8 11.5 - 15.5 %   Platelets 213.0 150.0 - 400.0 K/uL    Comment: Giant Platelets seen on smear.   Neutrophils Relative % 68.2 43.0 - 77.0 %   Lymphocytes Relative 20.4 12.0 - 46.0 %   Monocytes Relative 8.4 3.0 - 12.0 %   Eosinophils Relative 2.3 0.0 - 5.0 %   Basophils Relative 0.7 0.0 - 3.0 %   Neutro Abs 5.3 1.4 - 7.7 K/uL   Lymphs Abs 1.6 0.7 - 4.0 K/uL   Monocytes Absolute 0.7 0.1 - 1.0 K/uL   Eosinophils Absolute 0.2 0.0 - 0.7 K/uL   Basophils Absolute 0.1 0.0 - 0.1 K/uL  TSH     Status: None   Collection Time: 01/17/18 11:08 AM  Result Value Ref Range   TSH 1.49 0.35 - 4.50 uIU/mL  Basic metabolic panel     Status: Abnormal   Collection Time: 01/25/18  8:55 AM  Result Value Ref Range   Sodium 138 135 - 145 mEq/L   Potassium 3.7 3.5 - 5.1 mEq/L   Chloride 103 96 - 112 mEq/L   CO2 29 19 - 32 mEq/L   Glucose, Bld 110 (H) 70 - 99 mg/dL   BUN 19 6 - 23 mg/dL   Creatinine, Ser 1.26 0.40 - 1.50 mg/dL   Calcium 9.1 8.4 - 10.5 mg/dL   GFR 60.28 >60.00 mL/min   Assessment/Plan: 1. Tinea cruris Rx Butenafine once daily. Supportive measures and OTC products reviewed. Follow-up if not improving within 7 days. May take a couple of weeks to resolve. Patient aware.    Leeanne Rio, PA-C

## 2018-03-25 NOTE — Patient Instructions (Addendum)
Please keep the skin clean and dry. Apply the Butenafine once daily to the area. Can apply baby powder to the area throughout the day to help keep dry.  Symptoms should slowly resolve over the next couple of weeks. If no improvement within 7 days, please call or come see me.

## 2018-04-08 ENCOUNTER — Other Ambulatory Visit: Payer: Self-pay | Admitting: Family Medicine

## 2018-04-08 NOTE — Telephone Encounter (Signed)
Last OV 03/25/18 for Tinea Cruris, Next OV 07/02/18  Last filled by Historical Provider on 01/09/18, ? Not sure how many, with 3 refills.

## 2018-04-14 ENCOUNTER — Other Ambulatory Visit: Payer: Self-pay | Admitting: Cardiovascular Disease

## 2018-04-15 ENCOUNTER — Other Ambulatory Visit: Payer: Self-pay | Admitting: Cardiovascular Disease

## 2018-04-15 ENCOUNTER — Other Ambulatory Visit: Payer: Self-pay | Admitting: Family Medicine

## 2018-04-15 ENCOUNTER — Other Ambulatory Visit: Payer: Self-pay

## 2018-04-15 MED ORDER — LISINOPRIL-HYDROCHLOROTHIAZIDE 10-12.5 MG PO TABS: 1.0000 | ORAL_TABLET | Freq: Every day | ORAL | 0 refills | Status: DC

## 2018-04-15 NOTE — Telephone Encounter (Signed)
Appt scheduled. Medication refilled. Patient made aware and verbalized understanding

## 2018-04-15 NOTE — Telephone Encounter (Signed)
Appointment scheduled. Medication refilled. Patient notified and verbalized understanding.

## 2018-04-15 NOTE — Telephone Encounter (Signed)
Pt is needing a refill on his lisinopril-hydrochlorothiazide (PRINZIDE,ZESTORETIC) 10-12.5 MG tablet [[id number]]

## 2018-04-30 ENCOUNTER — Ambulatory Visit (INDEPENDENT_AMBULATORY_CARE_PROVIDER_SITE_OTHER): Payer: Medicare Other | Admitting: Cardiovascular Disease

## 2018-04-30 ENCOUNTER — Other Ambulatory Visit: Payer: Self-pay

## 2018-04-30 ENCOUNTER — Encounter: Payer: Self-pay | Admitting: Cardiovascular Disease

## 2018-04-30 VITALS — BP 134/73 | HR 57 | Ht 72.0 in | Wt 208.0 lb

## 2018-04-30 DIAGNOSIS — E7800 Pure hypercholesterolemia, unspecified: Secondary | ICD-10-CM | POA: Diagnosis not present

## 2018-04-30 DIAGNOSIS — I10 Essential (primary) hypertension: Secondary | ICD-10-CM

## 2018-04-30 DIAGNOSIS — I32 Pericarditis in diseases classified elsewhere: Secondary | ICD-10-CM

## 2018-04-30 MED ORDER — LISINOPRIL-HYDROCHLOROTHIAZIDE 10-12.5 MG PO TABS: 1.0000 | ORAL_TABLET | Freq: Every day | ORAL | 1 refills | Status: DC

## 2018-04-30 NOTE — Patient Instructions (Signed)
Medication Instructions:   Stop Aspirin.    Continue all other medications.    Labwork: none  Testing/Procedures: none  Follow-Up: As needed   Any Other Special Instructions Will Be Listed Below (If Applicable).  If you need a refill on your cardiac medications before your next appointment, please call your pharmacy.

## 2018-04-30 NOTE — Progress Notes (Signed)
SUBJECTIVE: The patient is a 72 year old male with a history of chest pain and pericarditis, hypertension, and hyperlipidemia.  When I last saw him on 09/20/2016 I told him he could follow-up as needed.  He previously underwent a normal nuclear stress test and echocardiogram demonstrated normal left ventricular systolic function.  Lipid panel 01/17/2018: Total cholesterol 118, triglycerides 187, HDL 28, LDL 52.  The patient denies any symptoms of chest pain, palpitations, shortness of breath, lightheadedness, dizziness, leg swelling, orthopnea, PND, and syncope.  He stays very active around the house and doing yard work.  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  This has taken a toll on his back.  He wants to avoid surgery.  I personally reviewed the ECG performed on 10/26/2017 which demonstrated sinus rhythm with early repolarization.  Review of Systems: As per "subjective", otherwise negative.  Allergies  Allergen Reactions  . Bee Venom Anaphylaxis    Current Outpatient Medications  Medication Sig Dispense Refill  . allopurinol (ZYLOPRIM) 100 MG tablet TAKE 1 TABLET BY MOUTH EVERY DAY 90 tablet 1  . aspirin EC 325 MG tablet Take 1 tablet (325 mg total) by mouth daily. For 30 days post op for DVT Prophylaxis 30 tablet 0  . diclofenac (VOLTAREN) 75 MG EC tablet TAKE 1 TABLET (75 MG TOTAL) BY MOUTH 2 (TWO) TIMES DAILY AS NEEDED. 60 tablet 3  . docusate sodium (COLACE) 100 MG capsule Take 1 capsule (100 mg total) by mouth 2 (two) times daily. To prevent constipation while taking pain medication. 60 capsule 0  . EPINEPHrine 0.3 mg/0.3 mL IJ SOAJ injection INJECT 0.3 MLS (0.3 MG TOTAL) INTO THE MUSCLE ONCE FOR 1 DOSE AS NEEDED  0  . gabapentin (NEURONTIN) 300 MG capsule Take 300 mg by mouth 3 (three) times daily.     Marland Kitchen HYDROcodone-acetaminophen (NORCO/VICODIN) 5-325 MG tablet Take 1 tablet by mouth every 6 (six) hours as needed. for pain  0  .  lisinopril-hydrochlorothiazide (PRINZIDE,ZESTORETIC) 10-12.5 MG tablet Take 1 tablet by mouth daily. 30 tablet 0  . metoprolol succinate (TOPROL-XL) 25 MG 24 hr tablet Take 1 tablet (25 mg total) by mouth daily. 90 tablet 1  . simvastatin (ZOCOR) 40 MG tablet Take 1 tablet (40 mg total) by mouth daily. 90 tablet 0   No current facility-administered medications for this visit.     Past Medical History:  Diagnosis Date  . Cellulitis and abscess of other specified site   . Colon polyps   . Gout   . Hypertension   . Neuromuscular disorder (HCC)    arthritis  . Scoliosis     Past Surgical History:  Procedure Laterality Date  . BACK SURGERY    . COLONOSCOPY  01/10/2013   Procedure: COLONOSCOPY;  Surgeon: Rogene Houston, MD;  [facility];  Service: Endoscopy;  Laterality: N/A;  730  . COLONOSCOPY N/A 08/21/2013   Procedure: COLONOSCOPY;  Surgeon: Rogene Houston, MD;  [facility];  Service: Endoscopy;  Laterality: N/A;  1030  . COLONOSCOPY N/A 08/26/2015   Procedure: COLONOSCOPY;  Surgeon: Rogene Houston, MD;  [facility];  Service: Endoscopy;  Laterality: N/A;  240  . colonscopy    . PARTIAL KNEE ARTHROPLASTY Left 11/06/2017   Procedure: UNICOMPARTMENTAL KNEE;  Surgeon: [name], MD;  Location: [facility];  Service: Orthopedics;  Laterality: Left;    Social History   Socioeconomic History  .  Marital status: Married    Spouse name: Not on file  . Number of children: Not on file  . Years of education: Not on file  . Highest education level: Not on file  Occupational History  . Occupation: Fish farm manager  . Financial resource strain: Not on file  . Food insecurity:    Worry: Not on file    Inability: Not on file  . Transportation needs:    Medical: Not on file    Non-medical: Not on file  Tobacco Use  . Smoking status: Former Smoker    Packs/day: 3.00    Years: 30.00    Pack years: 90.00    Types: Cigarettes    Start date:  12/18/1957    Last attempt to quit: 12/18/1996    Years since quitting: 21.3  . Smokeless tobacco: Never Used  . Tobacco comment: started smoking as a child  Substance and Sexual Activity  . Alcohol use: Yes    Alcohol/week: 0.0 oz    Comment: beer occasionally  . Drug use: No  . Sexual activity: Not on file  Lifestyle  . Physical activity:    Days per week: Not on file    Minutes per session: Not on file  . Stress: Not on file  Relationships  . Social connections:    Talks on phone: Not on file    Gets together: Not on file    Attends religious service: Not on file    Active member of club or organization: Not on file    Attends meetings of clubs or organizations: Not on file    Relationship status: Not on file  . Intimate partner violence:    Fear of current or ex partner: Not on file    Emotionally abused: Not on file    Physically abused: Not on file    Forced sexual activity: Not on file  Other Topics Concern  . Not on file  Social History Narrative  . Not on file     Vitals:   04/30/18 1251  BP: 134/73  Pulse: (!) 57  SpO2: 99%  Weight: 208 lb (94.3 kg)  Height: 6' (1.829 m)    Wt Readings from Last 3 Encounters:  04/30/18 208 lb (94.3 kg)  03/25/18 208 lb (94.3 kg)  01/17/18 207 lb 6 oz (94.1 kg)     PHYSICAL EXAM General: NAD HEENT: Normal. Neck: No JVD, no thyromegaly. Lungs: Clear to auscultation bilaterally with normal respiratory effort. CV: Regular rate and rhythm, normal S1/S2, no S3/S4, no murmur. No pretibial or periankle edema.  No carotid bruit.   Abdomen: Soft, nontender, no distention.  Neurologic: Alert and oriented.  Psych: Normal affect. Skin: Normal. Musculoskeletal: No gross deformities.    ECG: Most recent ECG reviewed.   Labs: Lab Results  Component Value Date/Time   K 3.7 01/25/2018 08:55 AM   BUN 19 01/25/2018 08:55 AM   CREATININE 1.26 01/25/2018 08:55 AM   ALT 24 01/17/2018 11:08 AM   TSH 1.49 01/17/2018 11:08 AM     HGB 13.7 01/17/2018 11:08 AM     Lipids: Lab Results  Component Value Date/Time   LDLCALC 52 01/17/2018 11:08 AM   LDLDIRECT 61.0 01/19/2017 02:11 PM   CHOL 118 01/17/2018 11:08 AM   TRIG 187.0 (H) 01/17/2018 11:08 AM   HDL 28.20 (L) 01/17/2018 11:08 AM       ASSESSMENT AND PLAN: 1.  Hypertension: Controlled on present therapy.  No changes.  Future refills  will be done by his PCP.  2.  Hyperlipidemia: Lipids reviewed above.  Continue simvastatin 40 mg.  3.  Pericarditis: No recurrences.  No further testing is indicated.    Disposition: Follow up as needed   Kate Sable, M.D., F.A.C.C.

## 2018-05-07 DIAGNOSIS — M5416 Radiculopathy, lumbar region: Secondary | ICD-10-CM | POA: Diagnosis not present

## 2018-05-07 DIAGNOSIS — M4726 Other spondylosis with radiculopathy, lumbar region: Secondary | ICD-10-CM | POA: Diagnosis not present

## 2018-05-07 DIAGNOSIS — M4156 Other secondary scoliosis, lumbar region: Secondary | ICD-10-CM | POA: Diagnosis not present

## 2018-05-07 DIAGNOSIS — M545 Low back pain: Secondary | ICD-10-CM | POA: Diagnosis not present

## 2018-05-07 DIAGNOSIS — M5136 Other intervertebral disc degeneration, lumbar region: Secondary | ICD-10-CM | POA: Diagnosis not present

## 2018-07-02 ENCOUNTER — Other Ambulatory Visit: Payer: Self-pay

## 2018-07-02 ENCOUNTER — Ambulatory Visit (INDEPENDENT_AMBULATORY_CARE_PROVIDER_SITE_OTHER): Payer: Medicare Other | Admitting: Family Medicine

## 2018-07-02 ENCOUNTER — Encounter: Payer: Self-pay | Admitting: Family Medicine

## 2018-07-02 VITALS — BP 118/74 | HR 57 | Temp 97.7°F | Resp 15 | Ht 72.0 in | Wt 203.8 lb

## 2018-07-02 DIAGNOSIS — E785 Hyperlipidemia, unspecified: Secondary | ICD-10-CM

## 2018-07-02 DIAGNOSIS — I10 Essential (primary) hypertension: Secondary | ICD-10-CM

## 2018-07-02 LAB — CBC WITH DIFFERENTIAL/PLATELET
BASOS ABS: 0.1 10*3/uL (ref 0.0–0.1)
Basophils Relative: 0.9 % (ref 0.0–3.0)
EOS ABS: 0.2 10*3/uL (ref 0.0–0.7)
Eosinophils Relative: 2.6 % (ref 0.0–5.0)
HCT: 42.4 % (ref 39.0–52.0)
Hemoglobin: 14.1 g/dL (ref 13.0–17.0)
LYMPHS ABS: 1.1 10*3/uL (ref 0.7–4.0)
Lymphocytes Relative: 18.9 % (ref 12.0–46.0)
MCHC: 33.2 g/dL (ref 30.0–36.0)
MCV: 86.2 fl (ref 78.0–100.0)
MONO ABS: 0.6 10*3/uL (ref 0.1–1.0)
Monocytes Relative: 9.4 % (ref 3.0–12.0)
NEUTROS PCT: 68.2 % (ref 43.0–77.0)
Neutro Abs: 4 10*3/uL (ref 1.4–7.7)
PLATELETS: 199 10*3/uL (ref 150.0–400.0)
RBC: 4.92 Mil/uL (ref 4.22–5.81)
RDW: 14.8 % (ref 11.5–15.5)
WBC: 5.9 10*3/uL (ref 4.0–10.5)

## 2018-07-02 LAB — BASIC METABOLIC PANEL
BUN: 19 mg/dL (ref 6–23)
CALCIUM: 9.6 mg/dL (ref 8.4–10.5)
CO2: 31 meq/L (ref 19–32)
Chloride: 103 mEq/L (ref 96–112)
Creatinine, Ser: 1.16 mg/dL (ref 0.40–1.50)
GFR: 66.24 mL/min (ref >60.00)
Glucose, Bld: 97 mg/dL (ref 70–99)
Potassium: 4.6 mEq/L (ref 3.5–5.1)
SODIUM: 140 meq/L (ref 135–145)

## 2018-07-02 LAB — HEPATIC FUNCTION PANEL
ALBUMIN: 4.3 g/dL (ref 3.5–5.2)
ALK PHOS: 89 U/L (ref 39–117)
ALT: 23 U/L (ref 0–53)
AST: 16 U/L (ref 0–37)
Bilirubin, Direct: 0.1 mg/dL (ref 0.0–0.3)
TOTAL PROTEIN: 6.6 g/dL (ref 6.0–8.3)
Total Bilirubin: 0.7 mg/dL (ref 0.2–1.2)

## 2018-07-02 LAB — LDL CHOLESTEROL, DIRECT: LDL DIRECT: 74 mg/dL

## 2018-07-02 LAB — LIPID PANEL
Cholesterol: 131 mg/dL (ref 0–200)
HDL: 32.3 mg/dL — AB (ref >39.00)
NONHDL: 98.74
Total CHOL/HDL Ratio: 4
Triglycerides: 209 mg/dL — ABNORMAL HIGH (ref 0.0–149.0)
VLDL: 41.8 mg/dL — ABNORMAL HIGH (ref 0.0–40.0)

## 2018-07-02 LAB — TSH: TSH: 1.91 u[IU]/mL (ref 0.35–4.50)

## 2018-07-02 NOTE — Patient Instructions (Signed)
Schedule your Medicare Wellness Visit with [name] at your convenience and a follow up with me in 6 months to recheck BP and cholesterol We'll notify you of your lab results and make any changes if needed Keep up the good work on healthy diet and regular exercise- you can do it! Call with any questions or concerns Have a great summer!!

## 2018-07-02 NOTE — Assessment & Plan Note (Signed)
Chronic problem.  Well controlled.  Asymptomatic.  Check labs.  No anticipated med changes.  Will follow. 

## 2018-07-02 NOTE — Progress Notes (Signed)
   Subjective:    Patient ID: [name], male    DOB: [birth year], 72 y.o.   MRN: [id number]  HPI HTN- chronic problem, on Lisinopril HCTZ 10/12.5mg  daily and Metoprolol 25mg  daily.  No CP, SOB, HAs, visual changes, edema.  Hyperlipidemia- chronic problem, on Simvastatin 40mg  daily.  + very active- yard work.  No abd pain, N/V.   Review of Systems For ROS see HPI     Objective:   Physical Exam  Constitutional: He is oriented to person, place, and time. He appears well-developed and well-nourished. No distress.  HENT:  Head: Normocephalic and atraumatic.  Eyes: Pupils are equal, round, and reactive to light. Conjunctivae and EOM are normal.  Neck: Normal range of motion. Neck supple. No thyromegaly present.  Cardiovascular: Normal rate, regular rhythm, normal heart sounds and intact distal pulses.  No murmur heard. Pulmonary/Chest: Effort normal and breath sounds normal. No respiratory distress.  Abdominal: Soft. Bowel sounds are normal. He exhibits no distension.  Musculoskeletal: He exhibits no edema.  Lymphadenopathy:    He has no cervical adenopathy.  Neurological: He is alert and oriented to person, place, and time. No cranial nerve deficit.  Skin: Skin is warm and dry.  Psychiatric: He has a normal mood and affect. His behavior is normal.  Vitals reviewed.         Assessment & Plan:

## 2018-07-02 NOTE — Assessment & Plan Note (Signed)
Chronic problem.  Tolerating statin w/o difficulty.  Check labs.  Adjust meds prn  

## 2018-07-03 ENCOUNTER — Encounter: Payer: Self-pay | Admitting: General Practice

## 2018-07-07 ENCOUNTER — Other Ambulatory Visit: Payer: Self-pay | Admitting: Family Medicine

## 2018-07-08 ENCOUNTER — Other Ambulatory Visit: Payer: Self-pay | Admitting: General Practice

## 2018-07-08 MED ORDER — LISINOPRIL-HYDROCHLOROTHIAZIDE 10-12.5 MG PO TABS: 1.0000 | ORAL_TABLET | Freq: Every day | ORAL | 1 refills | Status: DC

## 2018-07-24 NOTE — Progress Notes (Addendum)
Subjective:   [name] is a 72 y.o. male who presents for Medicare Annual/Subsequent preventive examination.  Review of Systems:  No ROS.  Medicare Wellness Visit. Additional risk factors are reflected in the social history.  Cardiac Risk Factors include: advanced age (>87men, >51 women);hypertension;male gender   Sleep patterns: Sleeps 6 hours, feels rested. Up to void x 1.  Home Safety/Smoke Alarms: Feels safe in home. Smoke alarms in place.  Living environment; residence and Firearm Safety: Lives with wife in 1 story home.  Seat Belt Safety/Bike Helmet: Wears seat belt.   Male:   CCS-Colonoscopy 08/26/2015, pt reports polyps. Recall 5 years.     PSA-  Lab Results  Component Value Date   PSA 0.55 07/18/2017       Objective:    Vitals: BP 112/60 (BP Location: Left Arm, Patient Position: Sitting, Cuff Size: Normal)   Pulse (!) 57   Ht 6' (1.829 m)   Wt 207 lb 4 oz (94 kg)   SpO2 97%   BMI 28.11 kg/m   Body mass index is 28.11 kg/m.  Advanced Directives 07/25/2018 12/06/2017 12/03/2017 11/06/2017 10/26/2017 07/18/2017 08/26/2015  Does Patient Have a Medical Advance Directive? No No No No No Yes No;Yes  Type of Advance Directive - - - - - Press photographer;Living will Living will  Copy of [place] in Chart? - - - - - No - copy requested -  Would patient like information on creating a medical advance directive? Yes (MAU/Ambulatory/Procedural Areas - Information given) - - No - Patient declined No - Patient declined - Yes - Educational materials given  Pre-existing out of facility DNR order (yellow form or pink MOST form) - - - - - - -    Tobacco Social History   Tobacco Use  Smoking Status Former Smoker  . Packs/day: 3.00  . Years: 30.00  . Pack years: 90.00  . Types: Cigarettes  . Start date: 12/18/1957  . Last attempt to quit: 12/18/1996  . Years since quitting: 21.6  Smokeless Tobacco Never Used  Tobacco Comment   started smoking  as a child     Counseling given: Not Answered Comment: started smoking as a child    Past Medical History:  Diagnosis Date  . Cellulitis and abscess of other specified site   . Colon polyps   . Gout   . Hypertension   . Neuromuscular disorder (HCC)    arthritis  . Scoliosis    Past Surgical History:  Procedure Laterality Date  . BACK SURGERY    . COLONOSCOPY  01/10/2013   Procedure: COLONOSCOPY;  Surgeon: Rogene Houston, MD;  [facility];  Service: Endoscopy;  Laterality: N/A;  730  . COLONOSCOPY N/A 08/21/2013   Procedure: COLONOSCOPY;  Surgeon: Rogene Houston, MD;  [facility];  Service: Endoscopy;  Laterality: N/A;  1030  . COLONOSCOPY N/A 08/26/2015   Procedure: COLONOSCOPY;  Surgeon: Rogene Houston, MD;  [facility];  Service: Endoscopy;  Laterality: N/A;  240  . colonscopy    . PARTIAL KNEE ARTHROPLASTY Left 11/06/2017   Procedure: UNICOMPARTMENTAL KNEE;  Surgeon: [name], MD;  Location: [place];  Service: Orthopedics;  Laterality: Left;   Family History  Problem Relation Age of Onset  . Kidney disease Sister   . Cancer Other        Family Hx of Cancer, CAD,Diabetes,Kidney Failure  . Colon cancer Neg Hx  Social History   Socioeconomic History  . Marital status: Married    Spouse name: Not on file  . Number of children: Not on file  . Years of education: Not on file  . Highest education level: Not on file  Occupational History  . Occupation: Fish farm manager  . Financial resource strain: Not on file  . Food insecurity:    Worry: Not on file    Inability: Not on file  . Transportation needs:    Medical: Not on file    Non-medical: Not on file  Tobacco Use  . Smoking status: Former Smoker    Packs/day: 3.00    Years: 30.00    Pack years: 90.00    Types: Cigarettes    Start date: 12/18/1957    Last attempt to quit: 12/18/1996    Years since quitting: 21.6  . Smokeless tobacco: Never Used  . Tobacco  comment: started smoking as a child  Substance and Sexual Activity  . Alcohol use: Yes    Alcohol/week: 0.0 standard drinks    Comment: beer occasionally  . Drug use: No  . Sexual activity: Not on file  Lifestyle  . Physical activity:    Days per week: Not on file    Minutes per session: Not on file  . Stress: Not on file  Relationships  . Social connections:    Talks on phone: Not on file    Gets together: Not on file    Attends religious service: Not on file    Active member of club or organization: Not on file    Attends meetings of clubs or organizations: Not on file    Relationship status: Not on file  Other Topics Concern  . Not on file  Social History Narrative  . Not on file    Outpatient Encounter Medications as of 07/25/2018  Medication Sig  . allopurinol (ZYLOPRIM) 100 MG tablet TAKE 1 TABLET BY MOUTH EVERY DAY  . diclofenac (VOLTAREN) 75 MG EC tablet TAKE 1 TABLET (75 MG TOTAL) BY MOUTH 2 (TWO) TIMES DAILY AS NEEDED.  Marland Kitchen docusate sodium (COLACE) 100 MG capsule Take 1 capsule (100 mg total) by mouth 2 (two) times daily. To prevent constipation while taking pain medication.  Marland Kitchen EPINEPHrine 0.3 mg/0.3 mL IJ SOAJ injection INJECT 0.3 MLS (0.3 MG TOTAL) INTO THE MUSCLE ONCE FOR 1 DOSE AS NEEDED  . gabapentin (NEURONTIN) 300 MG capsule Take 300 mg by mouth 3 (three) times daily.   Marland Kitchen HYDROcodone-acetaminophen (NORCO/VICODIN) 5-325 MG tablet Take 1 tablet by mouth every 6 (six) hours as needed. for pain  . lisinopril-hydrochlorothiazide (PRINZIDE,ZESTORETIC) 10-12.5 MG tablet Take 1 tablet by mouth daily.  . metoprolol succinate (TOPROL-XL) 25 MG 24 hr tablet Take 1 tablet (25 mg total) by mouth daily.  . simvastatin (ZOCOR) 40 MG tablet Take 1 tablet (40 mg total) by mouth daily.   No facility-administered encounter medications on file as of 07/25/2018.     Activities of Daily Living In your present state of health, do you have any difficulty performing the following  activities: 07/25/2018 01/17/2018  Hearing? N N  Vision? N N  Difficulty concentrating or making decisions? N N  Walking or climbing stairs? N N  Dressing or bathing? N N  Doing errands, shopping? N N  Preparing Food and eating ? N -  Using the Toilet? N -  In the past six months, have you accidently leaked urine? N -  Do you have problems with  loss of bowel control? N -  Managing your Medications? N -  Managing your Finances? N -  Housekeeping or managing your Housekeeping? N -  Some recent data might be hidden    Patient Care Team: Midge Minium, MD as PCP - General (Family Medicine) Jovita Gamma, MD as Consulting Physician (Neurosurgery) Herminio Commons, MD as Attending Physician (Cardiology) Rogene Houston, MD as Consulting Physician (Gastroenterology)   Assessment:   This is a routine wellness examination for [name].  Exercise Activities and Dietary recommendations Current Exercise Habits: Home exercise routine, Type of exercise: walking(fishing, yard work), Frequency (Times/Week): 5, Exercise limited by: None identified   Diet (meal preparation, eat out, water intake, caffeinated beverages, dairy products, fruits and vegetables): Drinks water and soda.   Breakfast: honey bun; biscuit Lunch: crackers (pb) Dinner: protein, vegetables  Goals      Patient Stated   . maintain current health (pt-stated)     Maintain current health      Other   . Patient Stated     Maintain current health       Fall Risk Fall Risk  07/25/2018 01/17/2018 07/18/2017 07/18/2017 01/19/2017  Falls in the past year? No No No No No    Depression Screen PHQ 2/9 Scores 07/25/2018 01/17/2018 07/18/2017 07/18/2017  PHQ - 2 Score 0 0 0 0  PHQ- 9 Score - 0 - 0    Cognitive Function MMSE - Mini Mental State Exam 07/25/2018  Orientation to time 5  Orientation to Place 5  Registration 3  Attention/ Calculation 3  Recall 1  Language- name 2 objects 2  Language- repeat 1  Language- follow  3 step command 3  Language- read & follow direction 1  Write a sentence 1  Copy design 1  Total score 26        Immunization History  Administered Date(s) Administered  . Pneumococcal Conjugate-13 07/18/2017  . Tdap 07/30/2007  . Zoster 01/19/2015    Screening Tests Health Maintenance  Topic Date Due  . INFLUENZA VACCINE  07/18/2018  . TETANUS/TDAP  10/29/2018 (Originally 07/29/2017)  . Hepatitis C Screening  10/29/2018 (Originally 06-05-1946)  . PNA vac Low Risk Adult (2 of 2 - PPSV23) 12/18/2018 (Originally 07/18/2018)  . COLONOSCOPY  08/25/2025   Declines Shingrix      Plan:    Bring a copy of your living will and/or healthcare power of attorney to your next office visit.  Continue doing brain stimulating activities (puzzles, reading, adult coloring books, staying active) to keep memory sharp.   I have personally reviewed and noted the following in the patient's chart:   . Medical and social history . Use of alcohol, tobacco or illicit drugs  . Current medications and supplements . Functional ability and status . Nutritional status . Physical activity . Advanced directives . List of other physicians . Hospitalizations, surgeries, and ER visits in previous 12 months . Vitals . Screenings to include cognitive, depression, and falls . Referrals and appointments  In addition, I have reviewed and discussed with patient certain preventive protocols, quality metrics, and best practice recommendations. A written personalized care plan for preventive services as well as general preventive health recommendations were provided to patient.     Gerilyn Nestle, RN  07/25/2018  F/U with PCP 12/30/2018  Reviewed documentation provided by RN and agree w/ above.  [name], MD

## 2018-07-25 ENCOUNTER — Ambulatory Visit (INDEPENDENT_AMBULATORY_CARE_PROVIDER_SITE_OTHER): Payer: Medicare Other

## 2018-07-25 ENCOUNTER — Other Ambulatory Visit: Payer: Self-pay

## 2018-07-25 VITALS — BP 112/60 | HR 57 | Ht 72.0 in | Wt 207.2 lb

## 2018-07-25 DIAGNOSIS — Z0000 Encounter for general adult medical examination without abnormal findings: Secondary | ICD-10-CM

## 2018-07-25 NOTE — Patient Instructions (Addendum)
Bring a copy of your living will and/or healthcare power of attorney to your next office visit.  Continue doing brain stimulating activities (puzzles, reading, adult coloring books, staying active) to keep memory sharp.    Health Maintenance, Male A healthy lifestyle and preventive care is important for your health and wellness. Ask your health care provider about what schedule of regular examinations is right for you. What should I know about weight and diet? Eat a Healthy Diet  Eat plenty of vegetables, fruits, whole grains, low-fat dairy products, and lean protein.  Do not eat a lot of foods high in solid fats, added sugars, or salt.  Maintain a Healthy Weight Regular exercise can help you achieve or maintain a healthy weight. You should:  Do at least 150 minutes of exercise each week. The exercise should increase your heart rate and make you sweat (moderate-intensity exercise).  Do strength-training exercises at least twice a week.  Watch Your Levels of Cholesterol and Blood Lipids  Have your blood tested for lipids and cholesterol every 5 years starting at 70 years of age. If you are at high risk for heart disease, you should start having your blood tested when you are 70 years old. You may need to have your cholesterol levels checked more often if: ? Your lipid or cholesterol levels are high. ? You are older than 70 years of age. ? You are at high risk for heart disease.  What should I know about cancer screening? Many types of cancers can be detected early and may often be prevented. Lung Cancer  You should be screened every year for lung cancer if: ? You are a current smoker who has smoked for at least 30 years. ? You are a former smoker who has quit within the past 15 years.  Talk to your health care provider about your screening options, when you should start screening, and how often you should be screened.  Colorectal Cancer  Routine colorectal cancer screening  usually begins at 70 years of age and should be repeated every 5-10 years until you are 70 years old. You may need to be screened more often if early forms of precancerous polyps or small growths are found. Your health care provider may recommend screening at an earlier age if you have risk factors for colon cancer.  Your health care provider may recommend using home test kits to check for hidden blood in the stool.  A small camera at the end of a tube can be used to examine your colon (sigmoidoscopy or colonoscopy). This checks for the earliest forms of colorectal cancer.  Prostate and Testicular Cancer  Depending on your age and overall health, your health care provider may do certain tests to screen for prostate and testicular cancer.  Talk to your health care provider about any symptoms or concerns you have about testicular or prostate cancer.  Skin Cancer  Check your skin from head to toe regularly.  Tell your health care provider about any new moles or changes in moles, especially if: ? There is a change in a mole's size, shape, or color. ? You have a mole that is larger than a pencil eraser.  Always use sunscreen. Apply sunscreen liberally and repeat throughout the day.  Protect yourself by wearing long sleeves, pants, a wide-brimmed hat, and sunglasses when outside.  What should I know about heart disease, diabetes, and high blood pressure?  If you are 18-39 years of age, have your blood pressure checked every   3-5 years. If you are 40 years of age or older, have your blood pressure checked every year. You should have your blood pressure measured twice-once when you are at a hospital or clinic, and once when you are not at a hospital or clinic. Record the average of the two measurements. To check your blood pressure when you are not at a hospital or clinic, you can use: ? An automated blood pressure machine at a pharmacy. ? A home blood pressure monitor.  Talk to your health care  provider about your target blood pressure.  If you are between 45-79 years old, ask your health care provider if you should take aspirin to prevent heart disease.  Have regular diabetes screenings by checking your fasting blood sugar level. ? If you are at a normal weight and have a low risk for diabetes, have this test once every three years after the age of 45. ? If you are overweight and have a high risk for diabetes, consider being tested at a younger age or more often.  A one-time screening for abdominal aortic aneurysm (AAA) by ultrasound is recommended for men aged 65-75 years who are current or former smokers. What should I know about preventing infection? Hepatitis B If you have a higher risk for hepatitis B, you should be screened for this virus. Talk with your health care provider to find out if you are at risk for hepatitis B infection. Hepatitis C Blood testing is recommended for:  Everyone born from 1945 through 1965.  Anyone with known risk factors for hepatitis C.  Sexually Transmitted Diseases (STDs)  You should be screened each year for STDs including gonorrhea and chlamydia if: ? You are sexually active and are younger than 70 years of age. ? You are older than 70 years of age and your health care provider tells you that you are at risk for this type of infection. ? Your sexual activity has changed since you were last screened and you are at an increased risk for chlamydia or gonorrhea. Ask your health care provider if you are at risk.  Talk with your health care provider about whether you are at high risk of being infected with HIV. Your health care provider may recommend a prescription medicine to help prevent HIV infection.  What else can I do?  Schedule regular health, dental, and eye exams.  Stay current with your vaccines (immunizations).  Do not use any tobacco products, such as cigarettes, chewing tobacco, and e-cigarettes. If you need help quitting, ask  your health care provider.  Limit alcohol intake to no more than 2 drinks per day. One drink equals 12 ounces of beer, 5 ounces of wine, or 1 ounces of hard liquor.  Do not use street drugs.  Do not share needles.  Ask your health care provider for help if you need support or information about quitting drugs.  Tell your health care provider if you often feel depressed.  Tell your health care provider if you have ever been abused or do not feel safe at home. This information is not intended to replace advice given to you by your health care provider. Make sure you discuss any questions you have with your health care provider. Document Released: 06/01/2008 Document Revised: 08/02/2016 Document Reviewed: 09/07/2015 Elsevier Interactive Patient Education  2018 [employer]vier Inc[employer]  

## 2018-08-29 ENCOUNTER — Other Ambulatory Visit: Payer: Self-pay | Admitting: Family Medicine

## 2018-12-09 ENCOUNTER — Other Ambulatory Visit: Payer: Self-pay | Admitting: Family Medicine

## 2018-12-09 MED ORDER — SIMVASTATIN 40 MG PO TABS: 40.0000 mg | ORAL_TABLET | Freq: Every day | ORAL | 0 refills | Status: DC

## 2018-12-09 NOTE — Telephone Encounter (Signed)
Copied from [place] [phone] [id number]. Topic: Quick Communication - Rx Refill/Question >> Dec 09, 2018 10:02 AM Cecelia Byars, NT wrote: Medication:   simvastatin (ZOCOR) 40 MG tablet   Has the patient contacted their pharmacy? yes (Agent: If no, request that the patient contact the pharmacy for the refill (Agent: If yes, when and what did the pharmacy advise? The  prescription has expired   Preferred Pharmacy (with phone number or street name [id number] - [address], [place] [phone] (Phone) [phone] (Fax)    Agent: Please be advised that RX refills may take up to 3 business days. We ask that you follow-up with your pharmacy.

## 2018-12-26 ENCOUNTER — Encounter: Payer: Self-pay | Admitting: Family Medicine

## 2018-12-26 ENCOUNTER — Other Ambulatory Visit: Payer: Self-pay

## 2018-12-26 ENCOUNTER — Ambulatory Visit: Payer: Medicare Other | Admitting: Family Medicine

## 2018-12-26 VITALS — BP 94/60 | HR 80 | Temp 98.0°F | Resp 16 | Ht 72.0 in | Wt 213.0 lb

## 2018-12-26 DIAGNOSIS — J00 Acute nasopharyngitis [common cold]: Secondary | ICD-10-CM | POA: Diagnosis not present

## 2018-12-26 MED ORDER — CETIRIZINE HCL 10 MG PO TABS: 10.0000 mg | ORAL_TABLET | Freq: Every day | ORAL | 11 refills | Status: DC

## 2018-12-26 MED ORDER — FLUTICASONE PROPIONATE 50 MCG/ACT NA SUSP: 2.0000 | Freq: Every day | NASAL | 6 refills | Status: DC

## 2018-12-26 NOTE — Progress Notes (Signed)
   Subjective:    Patient ID: [name], male    DOB: November 25, 1946, 73 y.o.   MRN: [id number]  HPI URI- pt reports sxs started ~1 week ago.  Taking Sudafed and Nyquil.  No fevers.  + sick contacts.  No sore throat.  Pt reports cough is worse at night, cough is productive in the AM and then things are 'ok' for the rest of the day.  Pt developed sinus pressure 2 days ago.  No tooth pain.  No N/V.   Review of Systems For ROS see HPI     Objective:   Physical Exam Vitals signs reviewed.  Constitutional:      General: He is not in acute distress.    Appearance: Normal appearance. He is well-developed. He is not ill-appearing.  HENT:     Head: Normocephalic and atraumatic.     Right Ear: Tympanic membrane and ear canal normal.     Left Ear: Tympanic membrane and ear canal normal.     Nose: Congestion present.     Mouth/Throat:     Mouth: Mucous membranes are moist.     Pharynx: No posterior oropharyngeal erythema.     Comments: + PND Eyes:     Conjunctiva/sclera: Conjunctivae normal.     Pupils: Pupils are equal, round, and reactive to light.  Neck:     Musculoskeletal: Normal range of motion and neck supple.  Cardiovascular:     Rate and Rhythm: Normal rate and regular rhythm.     Heart sounds: Normal heart sounds.  Pulmonary:     Effort: Pulmonary effort is normal. No respiratory distress.     Breath sounds: Normal breath sounds. No wheezing.     Comments: No cough heard Lymphadenopathy:     Cervical: No cervical adenopathy.  Skin:    General: Skin is warm and dry.  Neurological:     General: No focal deficit present.     Mental Status: He is alert.  Psychiatric:        Mood and Affect: Mood normal.        Behavior: Behavior normal.        Thought Content: Thought content normal.           Assessment & Plan:  Rhinitis- new.  No evidence of bacterial infxn so no need for abx.  Start daily nasal steroid and antihistamine.  Reviewed supportive care and red flags  that should prompt return.  Pt expressed understanding and is in agreement w/ plan.

## 2018-12-26 NOTE — Patient Instructions (Signed)
Follow up as needed or as scheduled START the Cetirizine (Zyrtec) daily to decrease congestion and post-nasal drip START the Fluticasone nasal spray- 2 sprays each nostril daily Drink plenty of fluids Rest as needed Call with any questions or concerns Hang in there!

## 2018-12-30 ENCOUNTER — Encounter: Payer: Self-pay | Admitting: Family Medicine

## 2018-12-30 ENCOUNTER — Ambulatory Visit: Payer: Medicare Other | Admitting: Family Medicine

## 2018-12-30 ENCOUNTER — Other Ambulatory Visit: Payer: Self-pay

## 2018-12-30 ENCOUNTER — Other Ambulatory Visit: Payer: Self-pay | Admitting: Family Medicine

## 2018-12-30 VITALS — BP 114/72 | HR 78 | Temp 98.1°F | Resp 16 | Ht 72.0 in | Wt 213.0 lb

## 2018-12-30 DIAGNOSIS — E785 Hyperlipidemia, unspecified: Secondary | ICD-10-CM | POA: Diagnosis not present

## 2018-12-30 DIAGNOSIS — Z23 Encounter for immunization: Secondary | ICD-10-CM | POA: Diagnosis not present

## 2018-12-30 DIAGNOSIS — I10 Essential (primary) hypertension: Secondary | ICD-10-CM | POA: Diagnosis not present

## 2018-12-30 LAB — HEPATIC FUNCTION PANEL
ALK PHOS: 118 U/L — AB (ref 39–117)
ALT: 27 U/L (ref 0–53)
AST: 16 U/L (ref 0–37)
Albumin: 4.1 g/dL (ref 3.5–5.2)
Bilirubin, Direct: 0.1 mg/dL (ref 0.0–0.3)
Total Bilirubin: 0.6 mg/dL (ref 0.2–1.2)
Total Protein: 6.6 g/dL (ref 6.0–8.3)

## 2018-12-30 LAB — BASIC METABOLIC PANEL
BUN: 18 mg/dL (ref 6–23)
CO2: 30 mEq/L (ref 19–32)
Calcium: 9.4 mg/dL (ref 8.4–10.5)
Chloride: 101 mEq/L (ref 96–112)
Creatinine, Ser: 1.29 mg/dL (ref 0.40–1.50)
GFR: 58.52 mL/min — ABNORMAL LOW (ref >60.00)
Glucose, Bld: 88 mg/dL (ref 70–99)
Potassium: 4.3 mEq/L (ref 3.5–5.1)
Sodium: 138 mEq/L (ref 135–145)

## 2018-12-30 LAB — CBC WITH DIFFERENTIAL/PLATELET
Basophils Absolute: 0.1 10*3/uL (ref 0.0–0.1)
Basophils Relative: 0.7 % (ref 0.0–3.0)
Eosinophils Absolute: 0.1 10*3/uL (ref 0.0–0.7)
Eosinophils Relative: 1.9 % (ref 0.0–5.0)
HCT: 42.2 % (ref 39.0–52.0)
Hemoglobin: 14 g/dL (ref 13.0–17.0)
LYMPHS ABS: 1.3 10*3/uL (ref 0.7–4.0)
Lymphocytes Relative: 17.7 % (ref 12.0–46.0)
MCHC: 33.1 g/dL (ref 30.0–36.0)
MCV: 86.8 fl (ref 78.0–100.0)
Monocytes Absolute: 0.6 10*3/uL (ref 0.1–1.0)
Monocytes Relative: 8.7 % (ref 3.0–12.0)
Neutro Abs: 5.2 10*3/uL (ref 1.4–7.7)
Neutrophils Relative %: 71 % (ref 43.0–77.0)
Platelets: 261 10*3/uL (ref 150.0–400.0)
RBC: 4.86 Mil/uL (ref 4.22–5.81)
RDW: 13.9 % (ref 11.5–15.5)
WBC: 7.3 10*3/uL (ref 4.0–10.5)

## 2018-12-30 LAB — LIPID PANEL
Cholesterol: 131 mg/dL (ref 0–200)
HDL: 29.3 mg/dL — ABNORMAL LOW (ref >39.00)
LDL Cholesterol: 63 mg/dL (ref 0–99)
NonHDL: 101.59
Total CHOL/HDL Ratio: 4
Triglycerides: 191 mg/dL — ABNORMAL HIGH (ref 0.0–149.0)
VLDL: 38.2 mg/dL (ref 0.0–40.0)

## 2018-12-30 NOTE — Progress Notes (Signed)
   Subjective:    Patient ID: [name], male    DOB: [birth year], 73 y.o.   MRN: [id number]  HPI HTN- chronic problem, on Lisinopril HCTZ 10/12.5mg  daily and Metoprolol 25mg  daily w/ good control.  No CP, SOB, HAs, visual changes, edema.  Hyperlipidemia- chronic problem, on Simvastatin 40mg  daily.  No abd pain, N/V.  Continues to walk regularly   Review of Systems For ROS see HPI     Objective:   Physical Exam Vitals signs reviewed.  Constitutional:      General: He is not in acute distress.    Appearance: He is well-developed.  HENT:     Head: Normocephalic and atraumatic.  Eyes:     Conjunctiva/sclera: Conjunctivae normal.     Pupils: Pupils are equal, round, and reactive to light.  Neck:     Musculoskeletal: Normal range of motion and neck supple.     Thyroid: No thyromegaly.  Cardiovascular:     Rate and Rhythm: Normal rate and regular rhythm.     Heart sounds: Normal heart sounds. No murmur.  Pulmonary:     Effort: Pulmonary effort is normal. No respiratory distress.     Breath sounds: Normal breath sounds.  Abdominal:     General: Bowel sounds are normal. There is no distension.     Palpations: Abdomen is soft.  Lymphadenopathy:     Cervical: No cervical adenopathy.  Skin:    General: Skin is warm and dry.  Neurological:     Mental Status: He is alert and oriented to person, place, and time.     Cranial Nerves: No cranial nerve deficit.  Psychiatric:        Behavior: Behavior normal.           Assessment & Plan:

## 2018-12-30 NOTE — Patient Instructions (Signed)
Schedule your complete physical in 6 months We'll notify you of your lab results and make any changes if needed Keep up the good work on healthy diet and regular exercise! Call with any questions or concerns Happy New Year!!!

## 2018-12-30 NOTE — Addendum Note (Signed)
Addended by: Davis Gourd on: 12/30/2018 09:56 AM   Modules accepted: Orders

## 2018-12-30 NOTE — Assessment & Plan Note (Signed)
Chronic problem.  Tolerating statin w/o difficulty.  Check labs.  Adjust meds prn  

## 2018-12-30 NOTE — Assessment & Plan Note (Signed)
Chronic problem.  Well controlled.  Asymptomatic.  Check labs.  No anticipated med changes.  Will follow. 

## 2018-12-31 ENCOUNTER — Other Ambulatory Visit: Payer: Self-pay | Admitting: Family Medicine

## 2019-01-06 ENCOUNTER — Other Ambulatory Visit: Payer: Self-pay | Admitting: Family Medicine

## 2019-01-22 IMAGING — DX DG KNEE 1-2V PORT*L*
2 series · 2 of 2 positions shown · non-contrast
Comparison: None.

CLINICAL DATA: Status post left hemiarthroplasty.

EXAM:
PORTABLE LEFT KNEE - 1-2 VIEW

[knee ap]
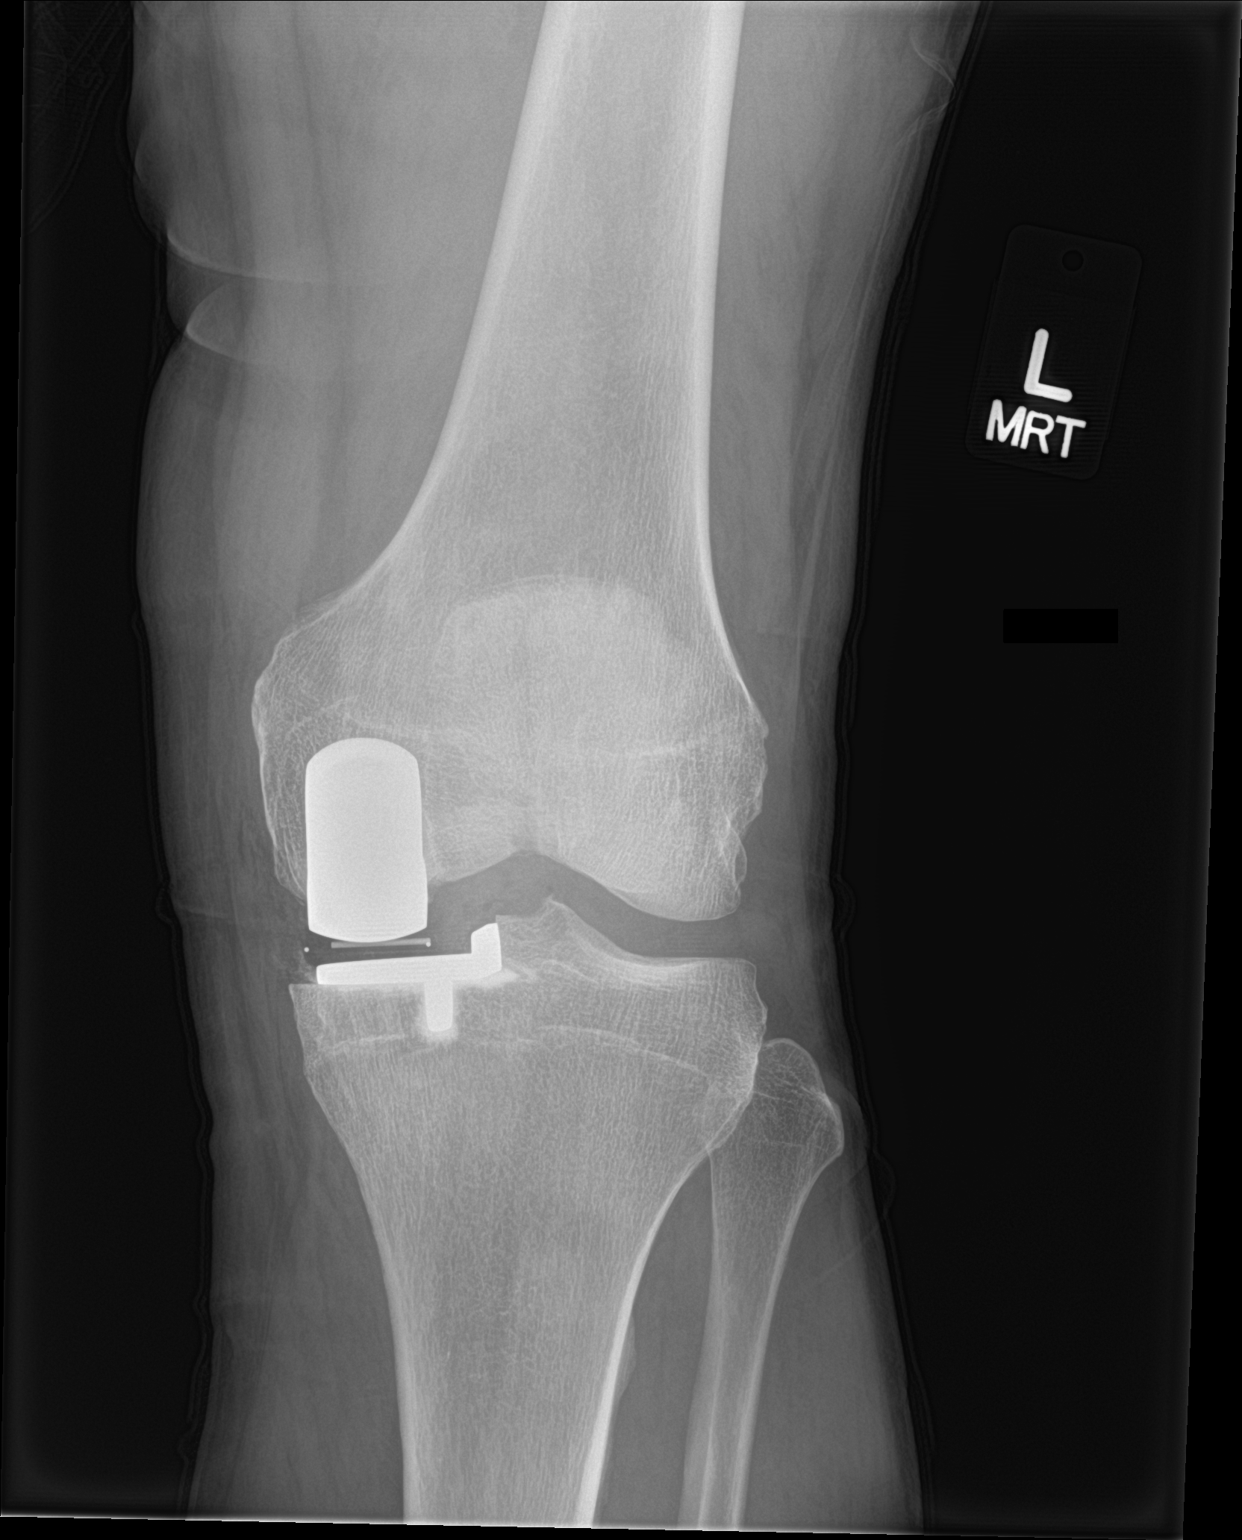

[knee lat]
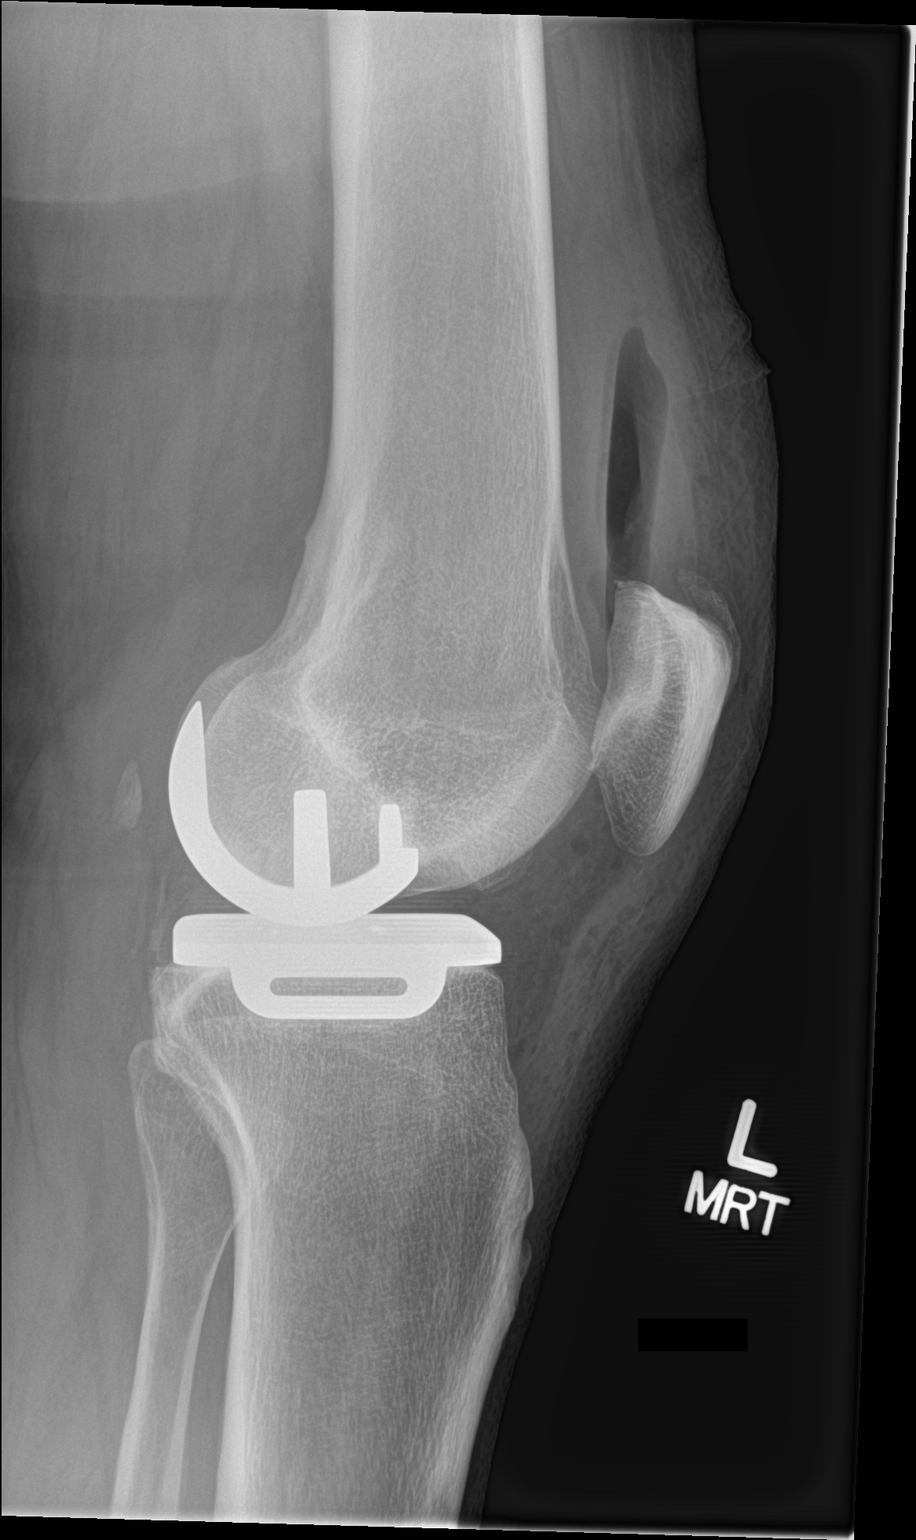

[2 of 2 positions shown; findings below may reference images not displayed]

FINDINGS: Status post left medial hemiarthroplasty, with femoral and tibial
components well situated. Fluid level is noted in suprapatellar
bursa as well as other postoperative changes are noted anteriorly.
No acute fracture or dislocation is noted.
IMPRESSION: Status post left knee medial hemiarthroplasty.

## 2019-01-29 DIAGNOSIS — M4726 Other spondylosis with radiculopathy, lumbar region: Secondary | ICD-10-CM | POA: Diagnosis not present

## 2019-01-29 DIAGNOSIS — M5416 Radiculopathy, lumbar region: Secondary | ICD-10-CM | POA: Diagnosis not present

## 2019-01-29 DIAGNOSIS — M5136 Other intervertebral disc degeneration, lumbar region: Secondary | ICD-10-CM | POA: Diagnosis not present

## 2019-01-29 DIAGNOSIS — M4156 Other secondary scoliosis, lumbar region: Secondary | ICD-10-CM | POA: Diagnosis not present

## 2019-03-26 ENCOUNTER — Other Ambulatory Visit: Payer: Self-pay | Admitting: Family Medicine

## 2019-04-23 DIAGNOSIS — M1712 Unilateral primary osteoarthritis, left knee: Secondary | ICD-10-CM | POA: Diagnosis not present

## 2019-04-23 DIAGNOSIS — M19011 Primary osteoarthritis, right shoulder: Secondary | ICD-10-CM | POA: Diagnosis not present

## 2019-05-03 ENCOUNTER — Other Ambulatory Visit: Payer: Self-pay | Admitting: Family Medicine

## 2019-06-05 ENCOUNTER — Other Ambulatory Visit: Payer: Self-pay | Admitting: Family Medicine

## 2019-06-22 ENCOUNTER — Other Ambulatory Visit: Payer: Self-pay | Admitting: Family Medicine

## 2019-06-25 ENCOUNTER — Other Ambulatory Visit: Payer: Self-pay | Admitting: Family Medicine

## 2019-06-27 ENCOUNTER — Other Ambulatory Visit: Payer: Self-pay | Admitting: Family Medicine

## 2019-07-11 ENCOUNTER — Encounter: Payer: Self-pay | Admitting: Family Medicine

## 2019-07-11 ENCOUNTER — Ambulatory Visit (INDEPENDENT_AMBULATORY_CARE_PROVIDER_SITE_OTHER): Payer: Medicare Other | Admitting: Family Medicine

## 2019-07-11 ENCOUNTER — Other Ambulatory Visit: Payer: Self-pay

## 2019-07-11 VITALS — BP 110/72 | HR 62 | Temp 97.0°F | Resp 16 | Ht 72.0 in | Wt 210.2 lb

## 2019-07-11 DIAGNOSIS — Z125 Encounter for screening for malignant neoplasm of prostate: Secondary | ICD-10-CM | POA: Diagnosis not present

## 2019-07-11 DIAGNOSIS — Z0000 Encounter for general adult medical examination without abnormal findings: Secondary | ICD-10-CM | POA: Diagnosis not present

## 2019-07-11 DIAGNOSIS — I10 Essential (primary) hypertension: Secondary | ICD-10-CM | POA: Diagnosis not present

## 2019-07-11 LAB — CBC WITH DIFFERENTIAL/PLATELET
Basophils Absolute: 0.1 10*3/uL (ref 0.0–0.1)
Basophils Relative: 1 % (ref 0.0–3.0)
Eosinophils Absolute: 0.1 10*3/uL (ref 0.0–0.7)
Eosinophils Relative: 2.3 % (ref 0.0–5.0)
HCT: 43.1 % (ref 39.0–52.0)
Hemoglobin: 14.1 g/dL (ref 13.0–17.0)
Lymphocytes Relative: 17.5 % (ref 12.0–46.0)
Lymphs Abs: 1.1 10*3/uL (ref 0.7–4.0)
MCHC: 32.8 g/dL (ref 30.0–36.0)
MCV: 88 fl (ref 78.0–100.0)
Monocytes Absolute: 0.6 10*3/uL (ref 0.1–1.0)
Monocytes Relative: 10.1 % (ref 3.0–12.0)
Neutro Abs: 4.4 10*3/uL (ref 1.4–7.7)
Neutrophils Relative %: 69.1 % (ref 43.0–77.0)
Platelets: 184 10*3/uL (ref 150.0–400.0)
RBC: 4.9 Mil/uL (ref 4.22–5.81)
RDW: 14.1 % (ref 11.5–15.5)
WBC: 6.4 10*3/uL (ref 4.0–10.5)

## 2019-07-11 LAB — BASIC METABOLIC PANEL
BUN: 25 mg/dL — ABNORMAL HIGH (ref 6–23)
CO2: 30 mEq/L (ref 19–32)
Calcium: 9.7 mg/dL (ref 8.4–10.5)
Chloride: 103 mEq/L (ref 96–112)
Creatinine, Ser: 1.29 mg/dL (ref 0.40–1.50)
GFR: 54.97 mL/min — ABNORMAL LOW (ref >60.00)
Glucose, Bld: 76 mg/dL (ref 70–99)
Potassium: 4.6 mEq/L (ref 3.5–5.1)
Sodium: 140 mEq/L (ref 135–145)

## 2019-07-11 LAB — HEPATIC FUNCTION PANEL
ALT: 23 U/L (ref 0–53)
AST: 16 U/L (ref 0–37)
Albumin: 4.3 g/dL (ref 3.5–5.2)
Alkaline Phosphatase: 87 U/L (ref 39–117)
Bilirubin, Direct: 0.1 mg/dL (ref 0.0–0.3)
Total Bilirubin: 0.7 mg/dL (ref 0.2–1.2)
Total Protein: 6.5 g/dL (ref 6.0–8.3)

## 2019-07-11 LAB — LIPID PANEL
Cholesterol: 124 mg/dL (ref 0–200)
HDL: 29.7 mg/dL — ABNORMAL LOW (ref >39.00)
NonHDL: 94.73
Total CHOL/HDL Ratio: 4
Triglycerides: 230 mg/dL — ABNORMAL HIGH (ref 0.0–149.0)
VLDL: 46 mg/dL — ABNORMAL HIGH (ref 0.0–40.0)

## 2019-07-11 LAB — TSH: TSH: 2.04 u[IU]/mL (ref 0.35–4.50)

## 2019-07-11 LAB — PSA, MEDICARE: PSA: 0.32 ng/ml (ref 0.10–4.00)

## 2019-07-11 LAB — LDL CHOLESTEROL, DIRECT: Direct LDL: 68 mg/dL

## 2019-07-11 NOTE — Assessment & Plan Note (Signed)
Chronic problem.  Currently well controlled.  Asymptomatic.  Check labs.  No anticipated med changes.  Will follow. 

## 2019-07-11 NOTE — Patient Instructions (Signed)
Follow up in 6 months to recheck BP and cholesterol We'll notify you of your lab results and make any changes if needed Keep up the good work!  You look great! Call with any questions or concerns Stay Safe!!!

## 2019-07-11 NOTE — Assessment & Plan Note (Signed)
Pt's PE WNL.  UTD on colonoscopy, immunizations.  Check labs.  Anticipatory guidance provided.  

## 2019-07-11 NOTE — Progress Notes (Signed)
   Subjective:    Patient ID: [name], male    DOB: [birth year], 73 y.o.   MRN: [id number]  HPI CPE- UTD on colonoscopy, immunizations.  No concerns today.   Review of Systems Patient reports no vision/hearing changes, anorexia, fever ,adenopathy, persistant/recurrent hoarseness, swallowing issues, chest pain, palpitations, edema, persistant/recurrent cough, hemoptysis, dyspnea (rest,exertional, paroxysmal nocturnal), gastrointestinal  bleeding (melena, rectal bleeding), abdominal pain, excessive heart burn, GU symptoms (dysuria, hematuria, voiding/incontinence issues) syncope, focal weakness, memory loss, numbness & tingling, skin/hair/nail changes, depression, anxiety, abnormal bruising/bleeding, musculoskeletal symptoms/signs.     Objective:   Physical Exam General Appearance:    Alert, cooperative, no distress, appears stated age  Head:    Normocephalic, without obvious abnormality, atraumatic  Eyes:    PERRL, conjunctiva/corneas clear, EOM's intact, fundi    benign, both eyes       Ears:    Normal TM's and external ear canals, both ears  Nose:   Deferred due to COVID  Throat:   Neck:   Supple, symmetrical, trachea midline, no adenopathy;       thyroid:  No enlargement/tenderness/nodules  Back:     Symmetric, no curvature, ROM normal, no CVA tenderness  Lungs:     Clear to auscultation bilaterally, respirations unlabored  Chest wall:    No tenderness or deformity  Heart:    Regular rate and rhythm, S1 and S2 normal, no murmur, rub   or gallop  Abdomen:     Soft, non-tender, bowel sounds active all four quadrants,    no masses, no organomegaly  Genitalia:    Deferred at pt's request  Rectal:    Extremities:   Extremities normal, atraumatic, no cyanosis or edema  Pulses:   2+ and symmetric all extremities  Skin:   Skin color, texture, turgor normal, no rashes or lesions  Lymph nodes:   Cervical, supraclavicular, and axillary nodes normal  Neurologic:   CNII-XII intact.  Normal strength, sensation and reflexes      throughout          Assessment & Plan:

## 2019-08-07 ENCOUNTER — Ambulatory Visit: Payer: Medicare Other

## 2019-09-05 ENCOUNTER — Other Ambulatory Visit: Payer: Self-pay | Admitting: Family Medicine

## 2019-09-15 ENCOUNTER — Other Ambulatory Visit: Payer: Self-pay | Admitting: Family Medicine

## 2019-12-08 ENCOUNTER — Other Ambulatory Visit: Payer: Self-pay | Admitting: Family Medicine

## 2019-12-15 ENCOUNTER — Other Ambulatory Visit: Payer: Self-pay | Admitting: Family Medicine

## 2020-01-11 ENCOUNTER — Other Ambulatory Visit: Payer: Self-pay | Admitting: Family Medicine

## 2020-01-12 ENCOUNTER — Other Ambulatory Visit: Payer: Self-pay | Admitting: Family Medicine

## 2020-01-14 ENCOUNTER — Encounter: Payer: Self-pay | Admitting: Family Medicine

## 2020-01-14 ENCOUNTER — Other Ambulatory Visit: Payer: Self-pay

## 2020-01-14 ENCOUNTER — Ambulatory Visit (INDEPENDENT_AMBULATORY_CARE_PROVIDER_SITE_OTHER): Payer: Medicare Other

## 2020-01-14 ENCOUNTER — Ambulatory Visit (INDEPENDENT_AMBULATORY_CARE_PROVIDER_SITE_OTHER): Payer: Medicare Other | Admitting: Family Medicine

## 2020-01-14 VITALS — BP 126/72 | HR 82 | Temp 97.5°F | Ht 72.0 in | Wt 214.8 lb

## 2020-01-14 DIAGNOSIS — E785 Hyperlipidemia, unspecified: Secondary | ICD-10-CM

## 2020-01-14 DIAGNOSIS — I10 Essential (primary) hypertension: Secondary | ICD-10-CM | POA: Diagnosis not present

## 2020-01-14 DIAGNOSIS — Z0000 Encounter for general adult medical examination without abnormal findings: Secondary | ICD-10-CM | POA: Diagnosis not present

## 2020-01-14 DIAGNOSIS — E663 Overweight: Secondary | ICD-10-CM | POA: Insufficient documentation

## 2020-01-14 LAB — LIPID PANEL
Cholesterol: 128 mg/dL (ref 0–200)
HDL: 30 mg/dL — ABNORMAL LOW (ref >39.00)
NonHDL: 98.14
Total CHOL/HDL Ratio: 4
Triglycerides: 225 mg/dL — ABNORMAL HIGH (ref 0.0–149.0)
VLDL: 45 mg/dL — ABNORMAL HIGH (ref 0.0–40.0)

## 2020-01-14 LAB — BASIC METABOLIC PANEL
BUN: 24 mg/dL — ABNORMAL HIGH (ref 6–23)
CO2: 30 mEq/L (ref 19–32)
Calcium: 9 mg/dL (ref 8.4–10.5)
Chloride: 103 mEq/L (ref 96–112)
Creatinine, Ser: 1.19 mg/dL (ref 0.40–1.50)
GFR: 60.24 mL/min (ref >60.00)
Glucose, Bld: 88 mg/dL (ref 70–99)
Potassium: 4.3 mEq/L (ref 3.5–5.1)
Sodium: 139 mEq/L (ref 135–145)

## 2020-01-14 LAB — CBC WITH DIFFERENTIAL/PLATELET
Basophils Absolute: 0.1 10*3/uL (ref 0.0–0.1)
Basophils Relative: 0.9 % (ref 0.0–3.0)
Eosinophils Absolute: 0.1 10*3/uL (ref 0.0–0.7)
Eosinophils Relative: 2 % (ref 0.0–5.0)
HCT: 44.7 % (ref 39.0–52.0)
Hemoglobin: 14.4 g/dL (ref 13.0–17.0)
Lymphocytes Relative: 20.1 % (ref 12.0–46.0)
Lymphs Abs: 1.5 10*3/uL (ref 0.7–4.0)
MCHC: 32.2 g/dL (ref 30.0–36.0)
MCV: 86.8 fl (ref 78.0–100.0)
Monocytes Absolute: 0.7 10*3/uL (ref 0.1–1.0)
Monocytes Relative: 9.2 % (ref 3.0–12.0)
Neutro Abs: 5 10*3/uL (ref 1.4–7.7)
Neutrophils Relative %: 67.8 % (ref 43.0–77.0)
Platelets: 194 10*3/uL (ref 150.0–400.0)
RBC: 5.15 Mil/uL (ref 4.22–5.81)
RDW: 13.6 % (ref 11.5–15.5)
WBC: 7.3 10*3/uL (ref 4.0–10.5)

## 2020-01-14 LAB — HEPATIC FUNCTION PANEL
ALT: 27 U/L (ref 0–53)
AST: 15 U/L (ref 0–37)
Albumin: 4.2 g/dL (ref 3.5–5.2)
Alkaline Phosphatase: 95 U/L (ref 39–117)
Bilirubin, Direct: 0.2 mg/dL (ref 0.0–0.3)
Total Bilirubin: 0.7 mg/dL (ref 0.2–1.2)
Total Protein: 6.3 g/dL (ref 6.0–8.3)

## 2020-01-14 LAB — TSH: TSH: 2.77 u[IU]/mL (ref 0.35–4.50)

## 2020-01-14 LAB — LDL CHOLESTEROL, DIRECT: Direct LDL: 71 mg/dL

## 2020-01-14 NOTE — Progress Notes (Addendum)
   Subjective:    Patient ID: [name], male    DOB: [birth year], 74 y.o.   MRN: [id number]  HPI HTN- chronic problem, Lisinopril HCTZ 10/12.5mg  daily, Metoprolol 25mg  daily w/ good control.  Pt reports feeling good.  No CP, SOB, HAs, visual changes, edema.  Hyperlipidemia- chronic problem, on Simvastatin 40mg  daily.  No abd pain, N/V.  Overweight- pt's BMI is 29.13 after gaining 4 lbs.  Continues to walk regularly.   Review of Systems For ROS see HPI   This visit occurred during the SARS-CoV-2 public health emergency.  Safety protocols were in place, including screening questions prior to the visit, additional usage of staff PPE, and extensive cleaning of exam room while observing appropriate contact time as indicated for disinfecting solutions.       Objective:   Physical Exam Vitals reviewed.  Constitutional:      General: He is not in acute distress.    Appearance: Normal appearance. He is well-developed.  HENT:     Head: Normocephalic and atraumatic.  Eyes:     Conjunctiva/sclera: Conjunctivae normal.     Pupils: Pupils are equal, round, and reactive to light.  Neck:     Thyroid: No thyromegaly.  Cardiovascular:     Rate and Rhythm: Normal rate and regular rhythm.     Heart sounds: Normal heart sounds. No murmur.  Pulmonary:     Effort: Pulmonary effort is normal. No respiratory distress.     Breath sounds: Normal breath sounds.  Abdominal:     General: Bowel sounds are normal. There is no distension.     Palpations: Abdomen is soft.  Musculoskeletal:     Cervical back: Normal range of motion and neck supple.  Lymphadenopathy:     Cervical: No cervical adenopathy.  Skin:    General: Skin is warm and dry.  Neurological:     Mental Status: He is alert and oriented to person, place, and time.     Cranial Nerves: No cranial nerve deficit.  Psychiatric:        Behavior: Behavior normal.           Assessment & Plan:

## 2020-01-14 NOTE — Assessment & Plan Note (Signed)
Chronic problem.  Well controlled today.  Asymptomatic.  Check labs.  No anticipated med changes.  Will follow. 

## 2020-01-14 NOTE — Patient Instructions (Addendum)
Mr. [name] , Thank you for taking time to come for your Medicare Wellness Visit. I appreciate your ongoing commitment to your health goals. Please review the following plan we discussed and let me know if I can assist you in the future.   Screening recommendations/referrals: Colorectal Screening: up to date; last colonoscopy 08/26/15  Vision and Dental Exams: Recommended annual ophthalmology exams for early detection of glaucoma and other disorders of the eye Recommended annual dental exams for proper oral hygiene   Vaccinations: Influenza vaccine: recommended Pneumococcal vaccine: up to date; last 12/30/18 Tdap vaccine: recommended; Please call your insurance company to determine your out of pocket expense. You may receive this vaccine at your local pharmacy or Health Dept. Shingles vaccine: Please call your insurance company to determine your out of pocket expense for the Shingrix vaccine. You may receive this vaccine at your local pharmacy. (see attached handouts)   Advanced directives: Advance directives discussed with you today. I have provided a copy for you to complete at home and have notarized. Once this is complete please bring a copy in to our office so we can scan it into your chart.  Goals: Recommend to drink at least 6-8 8oz glasses of water per day and consume a balanced diet rich in fresh fruits and vegetables.   Next appointment: Please schedule your Annual Wellness Visit with your Nurse Health Advisor in one year.  Preventive Care 72 Years and Older, Male Preventive care refers to lifestyle choices and visits with your health care provider that can promote health and wellness. What does preventive care include?  A yearly physical exam. This is also called an annual well check.  Dental exams once or twice a year.  Routine eye exams. Ask your health care provider how often you should have your eyes checked.  Personal lifestyle choices, including:  Daily care of your  teeth and gums.  Regular physical activity.  Eating a healthy diet.  Avoiding tobacco and drug use.  Limiting alcohol use.  Practicing safe sex.  Taking low doses of aspirin every day if recommended by your health care provider..  Taking vitamin and mineral supplements as recommended by your health care provider. What happens during an annual well check? The services and screenings done by your health care provider during your annual well check will depend on your age, overall health, lifestyle risk factors, and family history of disease. Counseling  Your health care provider may ask you questions about your:  Alcohol use.  Tobacco use.  Drug use.  Emotional well-being.  Home and relationship well-being.  Sexual activity.  Eating habits.  History of falls.  Memory and ability to understand (cognition).  Work and work Statistician. Screening  You may have the following tests or measurements:  Height, weight, and BMI.  Blood pressure.  Lipid and cholesterol levels. These may be checked every 5 years, or more frequently if you are over 14 years old.  Skin check.  Lung cancer screening. You may have this screening every year starting at age 72 if you have a 30-pack-year history of smoking and currently smoke or have quit within the past 15 years.  Fecal occult blood test (FOBT) of the stool. You may have this test every year starting at age 72.  Flexible sigmoidoscopy or colonoscopy. You may have a sigmoidoscopy every 5 years or a colonoscopy every 10 years starting at age 72.  Prostate cancer screening. Recommendations will vary depending on your family history and other risks.  Hepatitis  C blood test.  Hepatitis B blood test.  Sexually transmitted disease (STD) testing.  Diabetes screening. This is done by checking your blood sugar (glucose) after you have not eaten for a while (fasting). You may have this done every 1-3 years.  Abdominal aortic aneurysm  (AAA) screening. You may need this if you are a current or former smoker.  Osteoporosis. You may be screened starting at age 65 if you are at high risk. Talk with your health care provider about your test results, treatment options, and if necessary, the need for more tests. Vaccines  Your health care provider may recommend certain vaccines, such as:  Influenza vaccine. This is recommended every year.  Tetanus, diphtheria, and acellular pertussis (Tdap, Td) vaccine. You may need a Td booster every 10 years.  Zoster vaccine. You may need this after age 67.  Pneumococcal 13-valent conjugate (PCV13) vaccine. One dose is recommended after age 15.  Pneumococcal polysaccharide (PPSV23) vaccine. One dose is recommended after age 57. Talk to your health care provider about which screenings and vaccines you need and how often you need them. This information is not intended to replace advice given to you by your health care provider. Make sure you discuss any questions you have with your health care provider. Document Released: 12/31/2015 Document Revised: 08/23/2016 Document Reviewed: 10/05/2015 Elsevier Interactive Patient Education  2017 [place] Prevention in the Home Falls can cause injuries. They can happen to people of all ages. There are many things you can do to make your home safe and to help prevent falls. What can I do on the outside of my home?  Regularly fix the edges of walkways and driveways and fix any cracks.  Remove anything that might make you trip as you walk through a door, such as a raised step or threshold.  Trim any bushes or trees on the path to your home.  Use bright outdoor lighting.  Clear any walking paths of anything that might make someone trip, such as rocks or tools.  Regularly check to see if handrails are loose or broken. Make sure that both sides of any steps have handrails.  Any raised decks and porches should have guardrails on the  edges.  Have any leaves, snow, or ice cleared regularly.  Use sand or salt on walking paths during winter.  Clean up any spills in your garage right away. This includes oil or grease spills. What can I do in the bathroom?  Use night lights.  Install grab bars by the toilet and in the tub and shower. Do not use towel bars as grab bars.  Use non-skid mats or decals in the tub or shower.  If you need to sit down in the shower, use a plastic, non-slip stool.  Keep the floor dry. Clean up any water that spills on the floor as soon as it happens.  Remove soap buildup in the tub or shower regularly.  Attach bath mats securely with double-sided non-slip rug tape.  Do not have throw rugs and other things on the floor that can make you trip. What can I do in the bedroom?  Use night lights.  Make sure that you have a light by your bed that is easy to reach.  Do not use any sheets or blankets that are too big for your bed. They should not hang down onto the floor.  Have a firm chair that has side arms. You can use this for support while you get  dressed.  Do not have throw rugs and other things on the floor that can make you trip. What can I do in the kitchen?  Clean up any spills right away.  Avoid walking on wet floors.  Keep items that you use a lot in easy-to-reach places.  If you need to reach something above you, use a strong step stool that has a grab bar.  Keep electrical cords out of the way.  Do not use floor polish or wax that makes floors slippery. If you must use wax, use non-skid floor wax.  Do not have throw rugs and other things on the floor that can make you trip. What can I do with my stairs?  Do not leave any items on the stairs.  Make sure that there are handrails on both sides of the stairs and use them. Fix handrails that are broken or loose. Make sure that handrails are as long as the stairways.  Check any carpeting to make sure that it is firmly  attached to the stairs. Fix any carpet that is loose or worn.  Avoid having throw rugs at the top or bottom of the stairs. If you do have throw rugs, attach them to the floor with carpet tape.  Make sure that you have a light switch at the top of the stairs and the bottom of the stairs. If you do not have them, ask someone to add them for you. What else can I do to help prevent falls?  Wear shoes that:  Do not have high heels.  Have rubber bottoms.  Are comfortable and fit you well.  Are closed at the toe. Do not wear sandals.  If you use a stepladder:  Make sure that it is fully opened. Do not climb a closed stepladder.  Make sure that both sides of the stepladder are locked into place.  Ask someone to hold it for you, if possible.  Clearly mark and make sure that you can see:  Any grab bars or handrails.  First and last steps.  Where the edge of each step is.  Use tools that help you move around (mobility aids) if they are needed. These include:  Canes.  Walkers.  Scooters.  Crutches.  Turn on the lights when you go into a dark area. Replace any light bulbs as soon as they burn out.  Set up your furniture so you have a clear path. Avoid moving your furniture around.  If any of your floors are uneven, fix them.  If there are any pets around you, be aware of where they are.  Review your medicines with your doctor. Some medicines can make you feel dizzy. This can increase your chance of falling. Ask your doctor what other things that you can do to help prevent falls. This information is not intended to replace advice given to you by your health care provider. Make sure you discuss any questions you have with your health care provider. Document Released: 09/30/2009 Document Revised: 05/11/2016 Document Reviewed: 01/08/2015 Elsevier Interactive Patient Education  2017 [employer].

## 2020-01-14 NOTE — Patient Instructions (Signed)
Schedule your complete physical in 6 months We'll notify you of your lab results and make any changes if needed Continue to work on healthy diet and regular exercise Call with any questions or concerns Stay Safe!  Stay Healthy!

## 2020-01-14 NOTE — Assessment & Plan Note (Signed)
Chronic problem.  Tolerating statin w/o difficulty.  Check labs.  Adjust meds prn  

## 2020-01-14 NOTE — Progress Notes (Signed)
Subjective:   [name] is a 74 y.o. male who presents for Medicare Annual/Subsequent preventive examination.  Review of Systems:   Cardiac Risk Factors include: advanced age (>60men, >70 women);male gender;hypertension;dyslipidemia    Objective:    Vitals: BP 126/72   Pulse 82   Temp (!) 97.5 F (36.4 C) (Tympanic)   Ht 6' (1.829 m)   Wt 214 lb 12.8 oz (97.4 kg)   BMI 29.13 kg/m   Body mass index is 29.13 kg/m.  Advanced Directives 01/14/2020 07/25/2018 12/06/2017 12/03/2017 11/06/2017 10/26/2017 07/18/2017  Does Patient Have a Medical Advance Directive? No No No No No No Yes  Type of Advance Directive - - - - - - Press photographer;Living will  Copy of [place] in Chart? - - - - - - No - copy requested  Would patient like information on creating a medical advance directive? Yes (MAU/Ambulatory/Procedural Areas - Information given) Yes (MAU/Ambulatory/Procedural Areas - Information given) - - No - Patient declined No - Patient declined -  Pre-existing out of facility DNR order (yellow form or pink MOST form) - - - - - - -    Tobacco Social History   Tobacco Use  Smoking Status Former Smoker  . Packs/day: 3.00  . Years: 30.00  . Pack years: 90.00  . Types: Cigarettes  . Start date: 12/18/1957  . Quit date: 12/18/1996  . Years since quitting: 23.0  Smokeless Tobacco Never Used  Tobacco Comment   started smoking as a child     Counseling given: Not Answered Comment: started smoking as a child   Clinical Intake:  Pre-visit preparation completed: Yes  Pain : No/denies pain  Diabetes: No  How often do you need to have someone help you when you read instructions, pamphlets, or other written materials from your doctor or pharmacy?: 1 - Never  Interpreter Needed?: No  Information entered by :: Denman George LPN  Past Medical History:  Diagnosis Date  . Cellulitis and abscess of other specified site   . Colon polyps   . Gout   .  Hypertension   . Neuromuscular disorder (HCC)    arthritis  . Scoliosis    Past Surgical History:  Procedure Laterality Date  . BACK SURGERY    . COLONOSCOPY  01/10/2013   Procedure: COLONOSCOPY;  Surgeon: Rogene Houston, MD;  [facility];  Service: Endoscopy;  Laterality: N/A;  730  . COLONOSCOPY N/A 08/21/2013   Procedure: COLONOSCOPY;  Surgeon: Rogene Houston, MD;  [facility];  Service: Endoscopy;  Laterality: N/A;  1030  . COLONOSCOPY N/A 08/26/2015   Procedure: COLONOSCOPY;  Surgeon: Rogene Houston, MD;  [facility];  Service: Endoscopy;  Laterality: N/A;  240  . colonscopy    . PARTIAL KNEE ARTHROPLASTY Left 11/06/2017   Procedure: UNICOMPARTMENTAL KNEE;  Surgeon: [name], MD;  Location: [facility];  Service: Orthopedics;  Laterality: Left;   Family History  Problem Relation Age of Onset  . Kidney disease Sister   . Cancer Other        Family Hx of Cancer, CAD,Diabetes,Kidney Failure  . Colon cancer Neg Hx    Social History   Socioeconomic History  . Marital status: Married    Spouse name: Not on file  . Number of children: 2  . Years of education: Not on file  . Highest education level: Not on file  Occupational History  . 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 Smoking status: Former Smoker    Packs/day: 3.00    Years: 30.00    Pack years: 90.00    Types: Cigarettes    Start date: 12/18/1957    Quit date: 12/18/1996    Years since quitting: 23.0  . Smokeless tobacco: Never Used  . Tobacco comment: started smoking as a child  Substance and Sexual Activity  . Alcohol use: Yes    Alcohol/week: 0.0 standard drinks    Comment: beer occasionally  . Drug use: No  . Sexual activity: Not on file  Other Topics Concern  . Not on file  Social History Narrative   2 sons; 4 Grandchildren       Enjoys fishing    Social Determinants of Radio broadcast assistant Strain:   . Difficulty  of Paying Living Expenses: Not on file  Food Insecurity:   . Worried About Charity fundraiser in the Last Year: Not on file  . Ran Out of Food in the Last Year: Not on file  Transportation Needs:   . Lack of Transportation (Medical): Not on file  . Lack of Transportation (Non-Medical): Not on file  Physical Activity:   . Days of Exercise per Week: Not on file  . Minutes of Exercise per Session: Not on file  Stress:   . Feeling of Stress : Not on file  Social Connections:   . Frequency of Communication with Friends and Family: Not on file  . Frequency of Social Gatherings with Friends and Family: Not on file  . Attends Religious Services: Not on file  . Active Member of Clubs or Organizations: Not on file  . Attends Archivist Meetings: Not on file  . Marital Status: Not on file    Outpatient Encounter Medications as of 01/14/2020  Medication Sig  . allopurinol (ZYLOPRIM) 100 MG tablet TAKE 1 TABLET BY MOUTH EVERY DAY  . cetirizine (ZYRTEC) 10 MG tablet Take 1 tablet (10 mg total) by mouth daily.  Marland Kitchen docusate sodium (COLACE) 100 MG capsule Take 1 capsule (100 mg total) by mouth 2 (two) times daily. To prevent constipation while taking pain medication.  Marland Kitchen EPINEPHrine 0.3 mg/0.3 mL IJ SOAJ injection INJECT 0.3 MLS (0.3 MG TOTAL) INTO THE MUSCLE ONCE FOR 1 DOSE AS NEEDED  . gabapentin (NEURONTIN) 300 MG capsule Take 300 mg by mouth 3 (three) times daily.   Marland Kitchen HYDROcodone-acetaminophen (NORCO/VICODIN) 5-325 MG tablet Take 1 tablet by mouth every 6 (six) hours as needed. for pain  . lisinopril-hydrochlorothiazide (ZESTORETIC) 10-12.5 MG tablet TAKE 1 TABLET BY MOUTH EVERY DAY  . metoprolol succinate (TOPROL-XL) 25 MG 24 hr tablet TAKE 1 TABLET BY MOUTH EVERY DAY  . simvastatin (ZOCOR) 40 MG tablet TAKE 1 TABLET BY MOUTH EVERY DAY  . diclofenac (VOLTAREN) 75 MG EC tablet TAKE 1 TABLET BY MOUTH TWICE A DAY AS NEEDED (Patient not taking: Reported on 01/14/2020)  . fluticasone (FLONASE)  50 MCG/ACT nasal spray Place 2 sprays into both nostrils daily. (Patient not taking: Reported on 01/14/2020)   No facility-administered encounter medications on file as of 01/14/2020.    Activities of Daily Living In your present state of health, do you have any difficulty performing the following activities: 01/14/2020 07/11/2019  Hearing? N N  Vision? N N  Difficulty concentrating or making decisions? N N  Walking or climbing stairs? N N  Dressing or bathing? N N  Doing errands, shopping? N N  Preparing Food and eating ? N -  Using the Toilet? N -  In the past six months, have you accidently leaked urine? N -  Do you have problems with loss of bowel control? N -  Managing your Medications? N -  Managing your Finances? N -  Housekeeping or managing your Housekeeping? N -  Some recent data might be hidden    Patient Care Team: Midge Minium, MD as PCP - General (Family Medicine) Jovita Gamma, MD as Consulting Physician (Neurosurgery) Herminio Commons, MD as Attending Physician (Cardiology) Rogene Houston, MD as Consulting Physician (Gastroenterology)   Assessment:   This is a routine wellness examination for [name].  Exercise Activities and Dietary recommendations Current Exercise Habits: Home exercise routine, Type of exercise: walking;Other - see comments(yardwork and other outside activities), Time (Minutes): 30, Frequency (Times/Week): 5, Weekly Exercise (Minutes/Week): 150, Intensity: Mild  Goals    . maintain current health (pt-stated)     Maintain current health    . Patient Stated     Maintain current health       Fall Risk Fall Risk  01/14/2020 07/11/2019 12/30/2018 07/25/2018 01/17/2018  Falls in the past year? 0 0 0 No No  Number falls in past yr: 0 0 - - -  Injury with Fall? 0 0 - - -  Follow up Falls evaluation completed;Education provided;Falls prevention discussed - - - -   Is the patient's home free of loose throw rugs in walkways, pet beds,  electrical cords, etc?   yes      Grab bars in the bathroom? yes      Handrails on the stairs?   yes      Adequate lighting?   yes  Timed Get Up and Go Performed: completed and within normal timeframe; no gait abnormalities noted   Depression Screen PHQ 2/9 Scores 01/14/2020 07/11/2019 12/30/2018 07/25/2018  PHQ - 2 Score 0 0 0 0  PHQ- 9 Score - 0 0 -    Cognitive Function MMSE - Mini Mental State Exam 07/25/2018  Orientation to time 5  Orientation to Place 5  Registration 3  Attention/ Calculation 3  Recall 1  Language- name 2 objects 2  Language- repeat 1  Language- follow 3 step command 3  Language- read & follow direction 1  Write a sentence 1  Copy design 1  Total score 26     6CIT Screen 01/14/2020  What Year? 0 points  What month? 0 points  What time? 0 points  Count back from 20 0 points  Months in reverse 0 points  Repeat phrase 0 points  Total Score 0    Immunization History  Administered Date(s) Administered  . Pneumococcal Conjugate-13 07/18/2017  . Pneumococcal Polysaccharide-23 12/30/2018  . Tdap 07/30/2007  . Zoster 01/19/2015    Qualifies for Shingles Vaccine? Discussed and patient will check with pharmacy for coverage.  Patient education handout provided   Screening Tests Health Maintenance  Topic Date Due  . Hepatitis C Screening  Sep 23, 1946  . INFLUENZA VACCINE  03/17/2020 (Originally 07/19/2019)  . TETANUS/TDAP  01/13/2021 (Originally 07/29/2017)  . COLONOSCOPY  08/25/2025  . PNA vac Low Risk Adult  Completed   Cancer Screenings: Lung: Low Dose CT Chest recommended if Age 78-80 years, 30 pack-year currently smoking OR have quit w/in 15years. Patient does not qualify. Colorectal: colonoscopy 08/26/15   Plan:    I have personally reviewed and addressed the Medicare Annual Wellness  questionnaire and have noted the following in the patient's chart:  A. Medical and social history B. Use of alcohol, tobacco or illicit drugs  C. Current medications  and supplements D. Functional ability and status E.  Nutritional status F.  Physical activity G. Advance directives H. List of other physicians I.  Hospitalizations, surgeries, and ER visits in previous 12 months J.  [place] such as hearing and vision if needed, cognitive and depression L. Referrals, records requested, and appointments- none   In addition, I have reviewed and discussed with patient certain preventive protocols, quality metrics, and best practice recommendations. A written personalized care plan for preventive services as well as general preventive health recommendations were provided to patient.   Signed,  Denman George, LPN  Nurse Health Advisor   Nurse Notes: no additional

## 2020-01-14 NOTE — Assessment & Plan Note (Signed)
Pt has gained 4 lbs since last visit.  Discussed need for healthy diet and regular exercise.  Check labs to risk stratify.  Will follow.

## 2020-01-23 ENCOUNTER — Other Ambulatory Visit: Payer: Self-pay | Admitting: Family Medicine

## 2020-01-27 DIAGNOSIS — M4156 Other secondary scoliosis, lumbar region: Secondary | ICD-10-CM | POA: Diagnosis not present

## 2020-01-27 DIAGNOSIS — M4726 Other spondylosis with radiculopathy, lumbar region: Secondary | ICD-10-CM | POA: Diagnosis not present

## 2020-01-27 DIAGNOSIS — M5136 Other intervertebral disc degeneration, lumbar region: Secondary | ICD-10-CM | POA: Diagnosis not present

## 2020-04-02 ENCOUNTER — Telehealth: Payer: Self-pay | Admitting: Family Medicine

## 2020-04-02 NOTE — Progress Notes (Signed)
  Chronic Care Management   Outreach Note  04/02/2020 Name: DAYVIAN NIEMEYER MRN: [id number] DOB: January 17, 1946  Referred by: Midge Minium, MD Reason for referral : No chief complaint on file.   An unsuccessful telephone outreach was attempted today. The patient was referred to the pharmacist for assistance with care management and care coordination.   Follow Up Plan:   Earney Hamburg Upstream Scheduler

## 2020-04-06 ENCOUNTER — Telehealth: Payer: Self-pay | Admitting: Family Medicine

## 2020-04-06 NOTE — Progress Notes (Signed)
  Chronic Care Management   Outreach Note  04/06/2020 Name: [name] MRN: [id number] DOB: 06-Jul-1946  Referred by: Midge Minium, MD Reason for referral : No chief complaint on file.   An unsuccessful telephone outreach was attempted today. The patient was referred to the pharmacist for assistance with care management and care coordination.   Follow Up Plan:   Earney Hamburg Upstream Scheduler

## 2020-04-09 ENCOUNTER — Telehealth: Payer: Self-pay | Admitting: Family Medicine

## 2020-04-09 NOTE — Progress Notes (Signed)
  Chronic Care Management   Note  04/09/2020 Name: [name] MRN: [id number] DOB: September 16, 1946  [name] is a 74 y.o. year old male who is a primary care patient of Tabori, Aundra Millet, MD. I reached out to [name] by phone today in response to a referral sent by Mr. [name]'s PCP, Midge Minium, MD.   [name] was given information about Chronic Care Management services today including:  1. CCM service includes personalized support from designated clinical staff supervised by his physician, including individualized plan of care and coordination with other care providers 2. 24/7 contact phone numbers for assistance for urgent and routine care needs. 3. Service will only be billed when office clinical staff spend 20 minutes or more in a month to coordinate care. 4. Only one practitioner may furnish and bill the service in a calendar month. 5. The patient may stop CCM services at any time (effective at the end of the month) by phone call to the office staff.   Patient agreed to services and verbal consent obtained.    This note is not being shared with the patient for the following reason: To respect privacy (The patient or proxy has requested that the information not be shared).  Follow up plan:   Earney Hamburg Upstream Scheduler

## 2020-04-28 ENCOUNTER — Other Ambulatory Visit: Payer: Self-pay | Admitting: General Practice

## 2020-04-28 DIAGNOSIS — E785 Hyperlipidemia, unspecified: Secondary | ICD-10-CM

## 2020-04-28 DIAGNOSIS — I10 Essential (primary) hypertension: Secondary | ICD-10-CM

## 2020-04-28 DIAGNOSIS — M1712 Unilateral primary osteoarthritis, left knee: Secondary | ICD-10-CM

## 2020-05-21 ENCOUNTER — Other Ambulatory Visit: Payer: Self-pay | Admitting: Family Medicine

## 2020-05-21 MED ORDER — DICLOFENAC SODIUM 75 MG PO TBEC: 75.0000 mg | DELAYED_RELEASE_TABLET | Freq: Two times a day (BID) | ORAL | 3 refills | Status: DC | PRN

## 2020-05-21 NOTE — Telephone Encounter (Signed)
[place] for refill? Per visit on 01/14/20 he was not taking.

## 2020-05-21 NOTE — Telephone Encounter (Signed)
Patients spouse is calling in regard to refill on diclofenac 75mg .    Is requesting refill to be sent to [facility].    States pharmacy was to have reached out to our office on 6/3.

## 2020-05-27 ENCOUNTER — Other Ambulatory Visit: Payer: Self-pay

## 2020-05-27 ENCOUNTER — Ambulatory Visit: Payer: Medicare Other

## 2020-05-27 DIAGNOSIS — E785 Hyperlipidemia, unspecified: Secondary | ICD-10-CM

## 2020-05-27 DIAGNOSIS — M1A9XX0 Chronic gout, unspecified, without tophus (tophi): Secondary | ICD-10-CM

## 2020-05-27 DIAGNOSIS — I10 Essential (primary) hypertension: Secondary | ICD-10-CM

## 2020-05-27 NOTE — Progress Notes (Signed)
Chronic Care Management Pharmacy  Name: [name]  MRN: [id number] DOB: August 13, 1946  Chief Complaint/ HPI  [name],  74 y.o. , male presents for their Initial CCM visit with the clinical pharmacist via telephone due to COVID-19 Pandemic. Reports feeling like he is in great health and does not have any concerns related to his health or medications. Gout has not been an issue for him now that he has started allopurinol and cut shrimp out of diet. Reports staying active in through yard work, walking, fishing.   PCP : Midge Minium, MD  Encounter Diagnoses  Name Primary?   Hyperlipidemia, unspecified hyperlipidemia type Yes   Essential hypertension    Chronic gout without tophus, unspecified cause, unspecified site    Patient Active Problem List   Diagnosis Date Noted   Overweight (BMI 25.0-29.9) 01/14/2020   Physical exam 07/11/2019   Primary osteoarthritis of left knee 10/22/2017   Gout 01/19/2017   Chest pain 09/01/2014   Coronary artery disease excluded 05/20/2012   Hyperlipidemia 04/29/2011   Essential hypertension 11/13/2007   Past Surgical History:  Procedure Laterality Date   BACK SURGERY     COLONOSCOPY  01/10/2013   Procedure: COLONOSCOPY;  Surgeon: Rogene Houston, MD;  [facility];  Service: Endoscopy;  Laterality: N/A;  730   COLONOSCOPY N/A 08/21/2013   Procedure: COLONOSCOPY;  Surgeon: Rogene Houston, MD;  [facility];  Service: Endoscopy;  Laterality: N/A;  1030   COLONOSCOPY N/A 08/26/2015   Procedure: COLONOSCOPY;  Surgeon: Rogene Houston, MD;  [facility];  Service: Endoscopy;  Laterality: N/A;  240   colonscopy     PARTIAL KNEE ARTHROPLASTY Left 11/06/2017   Procedure: UNICOMPARTMENTAL KNEE;  Surgeon: [name], MD;  Location: [facility];  Service: Orthopedics;  Laterality: Left;   Social History   Socioeconomic History   Marital status: Married    Spouse name: Not on file   Number  of children: 2   Years of education: Not on file   Highest education level: Not on file  Occupational History   Occupation: Retired     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  Tobacco Use   Smoking status: Former Smoker    Packs/day: 3.00    Years: 30.00    Pack years: 90.00    Types: Cigarettes    Start date: 12/18/1957    Quit date: 12/18/1996    Years since quitting: 23.4   Smokeless tobacco: Never Used   Tobacco comment: started smoking as a child  Vaping Use   Vaping Use: Never used  Substance and Sexual Activity   Alcohol use: Yes    Alcohol/week: 0.0 standard drinks    Comment: beer occasionally   Drug use: No   Sexual activity: Not on file  Other Topics Concern   Not on file  Social History Narrative   2 sons; 47 Grandchildren       Enjoys fishing    Social Determinants of Radio broadcast assistant Strain:    Difficulty of Paying Living Expenses:   Food Insecurity: No Food Insecurity   Worried About Charity fundraiser in the Last Year: Never true   Arboriculturist in the Last Year: Never true  Transportation Needs: No Transportation Needs   Lack of Transportation (Medical): No   Lack of Transportation (Non-Medical): No  Physical Activity: Sufficiently Active   Days of Exercise per Week: 5 days  Minutes of Exercise per Session: 30 min  Stress:    Feeling of Stress :   Social Connections:    Frequency of Communication with Friends and Family:    Frequency of Social Gatherings with Friends and Family:    Attends Religious Services:    Active Member of Clubs or Organizations:    Attends Music therapist:    Marital Status:    Family History  Problem Relation Age of Onset   Kidney disease Sister    Cancer Other        Family Hx of Cancer, CAD,Diabetes,Kidney Failure   Colon cancer Neg Hx    Allergies  Allergen Reactions   Bee Venom Anaphylaxis   Outpatient Encounter Medications as of 05/27/2020  Medication  Sig   allopurinol (ZYLOPRIM) 100 MG tablet TAKE 1 TABLET BY MOUTH EVERY DAY   cetirizine (ZYRTEC) 10 MG tablet Take 1 tablet (10 mg total) by mouth daily.   diclofenac (VOLTAREN) 75 MG EC tablet Take 1 tablet (75 mg total) by mouth 2 (two) times daily as needed.   docusate sodium (COLACE) 100 MG capsule Take 1 capsule (100 mg total) by mouth 2 (two) times daily. To prevent constipation while taking pain medication.   EPINEPHrine 0.3 mg/0.3 mL IJ SOAJ injection INJECT 0.3 MLS (0.3 MG TOTAL) INTO THE MUSCLE ONCE FOR 1 DOSE AS NEEDED   fluticasone (FLONASE) 50 MCG/ACT nasal spray Place 2 sprays into both nostrils daily.   gabapentin (NEURONTIN) 300 MG capsule Take 300 mg by mouth 3 (three) times daily.    HYDROcodone-acetaminophen (NORCO/VICODIN) 5-325 MG tablet Take 1 tablet by mouth every 6 (six) hours as needed. for pain   lisinopril-hydrochlorothiazide (ZESTORETIC) 10-12.5 MG tablet TAKE 1 TABLET BY MOUTH EVERY DAY   metoprolol succinate (TOPROL-XL) 25 MG 24 hr tablet TAKE 1 TABLET BY MOUTH EVERY DAY   simvastatin (ZOCOR) 40 MG tablet TAKE 1 TABLET BY MOUTH EVERY DAY   No facility-administered encounter medications on file as of 05/27/2020.   Patient Care Team    Relationship Specialty Notifications Start End  Midge Minium, MD PCP - General Family Medicine  01/19/17   Jovita Gamma, MD Consulting Physician Neurosurgery  01/19/17   Herminio Commons, MD Attending Physician Cardiology  01/19/17   Rogene Houston, MD Consulting Physician Gastroenterology  01/19/17   [name], [facility] Pharmacist Pharmacist  04/09/20    Comment: PHONE NUMBER [phone]   Current Diagnosis/Assessment: Goals Addressed            This Visit's Progress    PharmD Care Plan       CARE PLAN ENTRY  Current Barriers:   Chronic Disease Management support, education, and care coordination needs related to Hypertension, Hyperlipidemia, and Gout   Hypertension  Pharmacist Clinical  Goal(s): o Over the next 180 days, patient will work with PharmD and providers to maintain BP goal <140/90  Current regimen:  o Lisinopril-hydrochlorothiazide 10-12.5 mg daily o Metoprolol succinate 25 mg daily   Interventions: o Continue current management  Patient self care activities - Over the next 180 days, patient will: o Check BP at least once every 1-2 weeks, document, and provide at future appointments o Ensure daily salt intake < 2300 mg/day  Hyperlipidemia  Pharmacist Clinical Goal(s): o Over the next 180 days, patient will work with PharmD and providers to maintain LDL goal < 100  Current regimen:  o Simvastatin 40 mg daily   Interventions: o Continue current management  Patient self  care activities - Over the next 180 days, patient will: o Continue current management  Gout  Pharmacist Clinical Goal(s) o Over the next 180 days, patient will work with PharmD and providers to minimize gout attacks  Current regimen:  o Allopurinol 100 mg daily   Interventions: o Continue current medication management o Diet recommendations  Patient self care activities - Over the next 180 days, patient will: o Continue allopurinol  o Maintain healthy diet  Medication management  Pharmacist Clinical Goal(s): o Over the next 180 days, patient will work with PharmD and providers to maintain optimal medication adherence  Current pharmacy: [facility]  Interventions o Comprehensive medication review performed. o Continue current medication management strategy  Patient self care activities - Over the next 180 days, patient will: o Take medications as prescribed o Report any questions or concerns to PharmD and/or provider(s) Initial goal documentation.        Hypertension   BP Readings from Last 3 Encounters:  01/14/20 126/72  01/14/20 126/72  07/11/19 110/72   Does not check BP at home. Consistently at goal at recent visits. Denies dizziness, SOB, chest  pain. Patient is currently controlled on the following medications:   Lisinopril-hydrochlorothiazide 10-12.5 mg daily   Metoprolol succinate 25 mg daily   Plan  Continue current medications and control with diet and exercise.     Hyperlipidemia      Component Value Date/Time   CHOL 128 01/14/2020 0906   TRIG 225.0 (H) 01/14/2020 0906   HDL 30.00 (L) 01/14/2020 0906   LDLCALC 63 12/30/2018 0958   LDLDIRECT 71.0 01/14/2020 0906   The ASCVD Risk score (Goff DC Jr., et al., 2013) failed to calculate for the following reasons:   The valid total cholesterol range is 130 to 320 mg/dL   Example diet: am - water bottle and honey bun, snack - crackers, lunch - sandwich with wheat bread, dinner - taco with tossed salad and snap beans. Exercise consists of routine walks, yard work, fishing.   LDL well controled on statin. Denies any muscle or abdominal pain or n/v. Patient is currently controlled on the following medications:   Simvastatin 40 mg daily  Plan  Continue current medications   Gout    Reports not having a gout attack in over 2 years now that he is on allopurinol and has cut out shrimp.  Lab Results  Component Value Date   LABURIC 7.4 01/19/2017   Counseled on avoidance of alcohol and low purine diet.  Patient is currently controlled on the following medications:   Allopurinol 100 mg daily   Plan  Continue current medications and control with diet and exercise.   Pain   Back pain, operation on back sometime in the 1980s. Taking docusate 100 mg twice daily. Has cut down to 1 tab of diclofenac/day. Denies any side effects at this time. Patient is currently controlled on the following medications:   Diclofenac 75 mg EC twice daily as needed   Hydrocodone-acetaminophen 5-325 - 1 tablet every 6 hours as needed for pain  Gabapentin 300 mg three times daily   Plan  Continue current medications   Vaccines   Reviewed and discussed patient's vaccination  history.  Due for Shingrix, Tdap.   Immunization History  Administered Date(s) Administered   Pneumococcal Conjugate-13 07/18/2017   Pneumococcal Polysaccharide-23 12/30/2018   Tdap 07/30/2007   Zoster 01/19/2015   Plan  Recommended patient receive Shingrix/Tdap vaccine in pharmacy.   Medication Management   Receives prescription  medications from:  [facility], [place] [address] Phone: [phone] Fax: [phone]   Denies any issues with current medication management.   Plan  Continue current medication management strategy.  Follow up: 6 month phone visit. ______________ Visit Information SDOH (Social Determinants of Health) assessments performed: Yes.  Mr. Burlingame was given information about Chronic Care Management services today including:  1. CCM service includes personalized support from designated clinical staff supervised by his physician, including individualized plan of care and coordination with other care providers 2. 24/7 contact phone numbers for assistance for urgent and routine care needs. 3. Standard insurance, coinsurance, copays and deductibles apply for chronic care management only during months in which we provide at least 20 minutes of these services. Most insurances cover these services at 100%, however patients may be responsible for any copay, coinsurance and/or deductible if applicable. This service may help you avoid the need for more expensive face-to-face services. 4. Only one practitioner may furnish and bill the service in a calendar month. 5. The patient may stop CCM services at any time (effective at the end of the month) by phone call to the office staff.  Patient agreed to services and verbal consent obtained.   [name], Pharm.D., BCGP Clinical Pharmacist [facility] at [facility] [phone]

## 2020-05-27 NOTE — Patient Instructions (Addendum)
Please call me at [phone] (direct line) with any questions - thank you!  - Edyth Gunnels., Clinical Pharmacist  Goals Addressed            This Visit's Progress   . PharmD Care Plan       CARE PLAN ENTRY  Current Barriers:  . Chronic Disease Management support, education, and care coordination needs related to Hypertension, Hyperlipidemia, and Gout   Hypertension . Pharmacist Clinical Goal(s): o Over the next 180 days, patient will work with PharmD and providers to maintain BP goal <140/90 . Current regimen:  o Lisinopril-hydrochlorothiazide 10-12.5 mg daily o Metoprolol succinate 25 mg daily  . Interventions: o Continue current management . Patient self care activities - Over the next 180 days, patient will: o Check BP at least once every 1-2 weeks, document, and provide at future appointments o Ensure daily salt intake < 2300 mg/day  Hyperlipidemia . Pharmacist Clinical Goal(s): o Over the next 180 days, patient will work with PharmD and providers to maintain LDL goal < 100 . Current regimen:  o Simvastatin 40 mg daily  . Interventions: o Continue current management . Patient self care activities - Over the next 180 days, patient will: o Continue current management  Gout . Pharmacist Clinical Goal(s) o Over the next 180 days, patient will work with PharmD and providers to minimize gout attacks . Current regimen:  o Allopurinol 100 mg daily  . Interventions: o Continue current medication management o Diet recommendations . Patient self care activities - Over the next 180 days, patient will: o Continue allopurinol  o Maintain healthy diet  Medication management . Pharmacist Clinical Goal(s): o Over the next 180 days, patient will work with PharmD and providers to maintain optimal medication adherence . Current pharmacy: [facility] . Interventions o Comprehensive medication review performed. o Continue current medication management strategy . Patient self  care activities - Over the next 180 days, patient will: o Take medications as prescribed o Report any questions or concerns to PharmD and/or provider(s) Initial goal documentation.        Mr. Turgeon was given information about Chronic Care Management services today including:  1. CCM service includes personalized support from designated clinical staff supervised by his physician, including individualized plan of care and coordination with other care providers 2. 24/7 contact phone numbers for assistance for urgent and routine care needs. 3. Standard insurance, coinsurance, copays and deductibles apply for chronic care management only during months in which we provide at least 20 minutes of these services. Most insurances cover these services at 100%, however patients may be responsible for any copay, coinsurance and/or deductible if applicable. This service may help you avoid the need for more expensive face-to-face services. 4. Only one practitioner may furnish and bill the service in a calendar month. 5. The patient may stop CCM services at any time (effective at the end of the month) by phone call to the office staff.  Patient agreed to services and verbal consent obtained.   The patient verbalized understanding of instructions provided today and agreed to receive a mailed copy of patient instruction and/or educational materials. Telephone follow up appointment with pharmacy team member scheduled for: See next appointment with "Care Management Staff" under "What's Next" below.   Thank you!  [name], Pharm.D., BCGP Clinical Pharmacist [facility] at [facility] [phone] Low-Purine Eating Plan A low-purine eating plan involves making food choices to limit your intake of purine. Purine is a kind of  uric acid. Too much uric acid in your blood can cause certain conditions, such as gout and kidney stones. Eating a low-purine diet can help control these  conditions. What are tips for following this plan? Reading food labels   Avoid foods with saturated or Trans fat.  Check the ingredient list of grains-based foods, such as bread and cereal, to make sure that they contain whole grains.  Check the ingredient list of sauces or soups to make sure they do not contain meat or fish.  When choosing soft drinks, check the ingredient list to make sure they do not contain high-fructose corn syrup. Shopping  Buy plenty of fresh fruits and vegetables.  Avoid buying canned or fresh fish.  Buy dairy products labeled as low-fat or nonfat.  Avoid buying premade or processed foods. These foods are often high in fat, salt (sodium), and added sugar. Cooking  Use olive oil instead of butter when cooking. Oils like olive oil, canola oil, and sunflower oil contain healthy fats. Meal planning  Learn which foods do or do not affect you. If you find out that a food tends to cause your gout symptoms to flare up, avoid eating that food. You can enjoy foods that do not cause problems. If you have any questions about a food item, talk with your dietitian or health care provider.  Limit foods high in fat, especially saturated fat. Fat makes it harder for your body to get rid of uric acid.  Choose foods that are lower in fat and are lean sources of protein. General guidelines  Limit alcohol intake to no more than 1 drink a day for nonpregnant women and 2 drinks a day for men. One drink equals 12 oz of beer, 5 oz of wine, or 1 oz of hard liquor. Alcohol can affect the way your body gets rid of uric acid.  Drink plenty of water to keep your urine clear or pale yellow. Fluids can help remove uric acid from your body.  If directed by your health care provider, take a vitamin C supplement.  Work with your health care provider and dietitian to develop a plan to achieve or maintain a healthy weight. Losing weight can help reduce uric acid in your blood. What foods  are recommended? The items listed may not be a complete list. Talk with your dietitian about what dietary choices are best for you. Foods low in purines Foods low in purines do not need to be limited. These include:  All fruits.  All low-purine vegetables, pickles, and olives.  Breads, pasta, rice, cornbread, and popcorn. Cake and other baked goods.  All dairy foods.  Eggs, nuts, and nut butters.  Spices and condiments, such as salt, herbs, and vinegar.  Plant oils, butter, and margarine.  Water, sugar-free soft drinks, tea, coffee, and cocoa.  Vegetable-based soups, broths, sauces, and gravies. Foods moderate in purines Foods moderate in purines should be limited to the amounts listed.   cup of asparagus, cauliflower, spinach, mushrooms, or green peas, each day.  2/3 cup uncooked oatmeal, each day.   cup dry wheat bran or wheat germ, each day.  2-3 ounces of meat or poultry, each day.  4-6 ounces of shellfish, such as crab, lobster, oysters, or shrimp, each day.  1 cup cooked beans, peas, or lentils, each day.  Soup, broths, or bouillon made from meat or fish. Limit these foods as much as possible. What foods are not recommended? The items listed may not be a complete  list. Talk with your dietitian about what dietary choices are best for you. Limit your intake of foods high in purines, including:  Beer and other alcohol.  Meat-based gravy or sauce.  Canned or fresh fish, such as: ? Anchovies, sardines, herring, and tuna. ? Mussels and scallops. ? Codfish, trout, and haddock.  Berniece Salines.  Organ meats, such as: ? Liver or kidney. ? Tripe. ? Sweetbreads (thymus gland or pancreas).  Wild Clinical biochemist.  Yeast or yeast extract supplements.  Drinks sweetened with high-fructose corn syrup. Summary  Eating a low-purine diet can help control conditions caused by too much uric acid in the body, such as gout or kidney stones.  Choose low-purine foods, limit  alcohol, and limit foods high in fat.  You will learn over time which foods do or do not affect you. If you find out that a food tends to cause your gout symptoms to flare up, avoid eating that food. This information is not intended to replace advice given to you by your health care provider. Make sure you discuss any questions you have with your health care provider. Document Revised: 11/16/2017 Document Reviewed: 01/17/2017 Elsevier Patient Education  2020 [employer].

## 2020-06-03 ENCOUNTER — Telehealth: Payer: Self-pay | Admitting: Family Medicine

## 2020-06-03 MED ORDER — METOPROLOL SUCCINATE ER 25 MG PO TB24: 25.0000 mg | ORAL_TABLET | Freq: Every day | ORAL | 1 refills | Status: DC

## 2020-06-03 NOTE — Telephone Encounter (Signed)
Patient is requesting a refill on Toprol XL - He would like it sent to [facility]

## 2020-06-03 NOTE — Telephone Encounter (Signed)
Medication filled to pharmacy as requested.   

## 2020-06-29 ENCOUNTER — Telehealth: Payer: Self-pay | Admitting: Family Medicine

## 2020-06-29 NOTE — Telephone Encounter (Signed)
Pt called in asking for a new script of the lisinopril to go to the [facility]. Pt is leaving to go out of town on Saturday. Please advise

## 2020-06-30 MED ORDER — LISINOPRIL-HYDROCHLOROTHIAZIDE 10-12.5 MG PO TABS: 1.0000 | ORAL_TABLET | Freq: Every day | ORAL | 1 refills | Status: DC

## 2020-06-30 NOTE — Telephone Encounter (Signed)
Medication filled to pharmacy as requested.   

## 2020-07-19 ENCOUNTER — Ambulatory Visit (INDEPENDENT_AMBULATORY_CARE_PROVIDER_SITE_OTHER): Payer: Medicare Other | Admitting: Family Medicine

## 2020-07-19 ENCOUNTER — Encounter: Payer: Self-pay | Admitting: Family Medicine

## 2020-07-19 ENCOUNTER — Other Ambulatory Visit: Payer: Self-pay

## 2020-07-19 VITALS — BP 122/81 | HR 67 | Temp 97.4°F | Resp 16 | Ht 72.0 in | Wt 213.4 lb

## 2020-07-19 DIAGNOSIS — I10 Essential (primary) hypertension: Secondary | ICD-10-CM

## 2020-07-19 DIAGNOSIS — Z125 Encounter for screening for malignant neoplasm of prostate: Secondary | ICD-10-CM

## 2020-07-19 DIAGNOSIS — Z0000 Encounter for general adult medical examination without abnormal findings: Secondary | ICD-10-CM | POA: Diagnosis not present

## 2020-07-19 LAB — PSA, MEDICARE: PSA: 0.4 ng/ml (ref 0.10–4.00)

## 2020-07-19 LAB — CBC WITH DIFFERENTIAL/PLATELET
Basophils Absolute: 0.1 10*3/uL (ref 0.0–0.1)
Basophils Relative: 0.7 % (ref 0.0–3.0)
Eosinophils Absolute: 0.1 10*3/uL (ref 0.0–0.7)
Eosinophils Relative: 1.8 % (ref 0.0–5.0)
HCT: 44.5 % (ref 39.0–52.0)
Hemoglobin: 14.7 g/dL (ref 13.0–17.0)
Lymphocytes Relative: 17.9 % (ref 12.0–46.0)
Lymphs Abs: 1.4 10*3/uL (ref 0.7–4.0)
MCHC: 33 g/dL (ref 30.0–36.0)
MCV: 88.9 fl (ref 78.0–100.0)
Monocytes Absolute: 0.7 10*3/uL (ref 0.1–1.0)
Monocytes Relative: 8.7 % (ref 3.0–12.0)
Neutro Abs: 5.6 10*3/uL (ref 1.4–7.7)
Neutrophils Relative %: 70.9 % (ref 43.0–77.0)
Platelets: 206 10*3/uL (ref 150.0–400.0)
RBC: 5.01 Mil/uL (ref 4.22–5.81)
RDW: 13.9 % (ref 11.5–15.5)
WBC: 7.8 10*3/uL (ref 4.0–10.5)

## 2020-07-19 LAB — LIPID PANEL
Cholesterol: 138 mg/dL (ref 0–200)
HDL: 33.8 mg/dL — ABNORMAL LOW (ref >39.00)
LDL Cholesterol: 66 mg/dL (ref 0–99)
NonHDL: 104.49
Total CHOL/HDL Ratio: 4
Triglycerides: 192 mg/dL — ABNORMAL HIGH (ref 0.0–149.0)
VLDL: 38.4 mg/dL (ref 0.0–40.0)

## 2020-07-19 LAB — HEPATIC FUNCTION PANEL
ALT: 27 U/L (ref 0–53)
AST: 18 U/L (ref 0–37)
Albumin: 4.3 g/dL (ref 3.5–5.2)
Alkaline Phosphatase: 105 U/L (ref 39–117)
Bilirubin, Direct: 0.1 mg/dL (ref 0.0–0.3)
Total Bilirubin: 0.4 mg/dL (ref 0.2–1.2)
Total Protein: 6.7 g/dL (ref 6.0–8.3)

## 2020-07-19 LAB — BASIC METABOLIC PANEL
BUN: 23 mg/dL (ref 6–23)
CO2: 29 mEq/L (ref 19–32)
Calcium: 9.5 mg/dL (ref 8.4–10.5)
Chloride: 102 mEq/L (ref 96–112)
Creatinine, Ser: 1.29 mg/dL (ref 0.40–1.50)
GFR: 54.81 mL/min — ABNORMAL LOW (ref >60.00)
Glucose, Bld: 85 mg/dL (ref 70–99)
Potassium: 4.9 mEq/L (ref 3.5–5.1)
Sodium: 139 mEq/L (ref 135–145)

## 2020-07-19 LAB — TSH: TSH: 2.12 u[IU]/mL (ref 0.35–4.50)

## 2020-07-19 NOTE — Assessment & Plan Note (Signed)
Pt's PE WNL w/ exception of skin cancer anterior to L ear and being overweight.  UTD on colonoscopy, COVID, and PNA vaccines.  Will get Td at pharmacy.  Stressed need to f/u w/ derm- pt plans to schedule.  Check labs.  Anticipatory guidance provided.

## 2020-07-19 NOTE — Patient Instructions (Addendum)
Follow up in 6 months to recheck BP and cholesterol We'll notify you of your lab results and make any changes if needed SCHEDULE a Dermatology appt Get your tetanus shot at the pharmacy Call with any questions or concerns Stay Safe!  Stay Healthy!

## 2020-07-19 NOTE — Assessment & Plan Note (Signed)
Chronic problem.  Well controlled.  Currently asymptomatic.  Check labs.  No anticipated med changes.  Will follow. 

## 2020-07-19 NOTE — Addendum Note (Signed)
Addended by: Midge Minium on: 07/19/2020 10:39 AM   Modules accepted: Orders

## 2020-07-19 NOTE — Progress Notes (Signed)
   Subjective:    Patient ID: [name], male    DOB: [birth year], 74 y.o.   MRN: [id number]  HPI CPE- UTD on colonoscopy, COVID vaccine.  Due for repeat Td  Reviewed past medical, surgical, family and social histories.   Patient Care Team    Relationship Specialty Notifications Start End  Midge Minium, MD PCP - General Family Medicine  01/19/17   Jovita Gamma, MD Consulting Physician Neurosurgery  01/19/17   Herminio Commons, MD (Inactive) Attending Physician Cardiology  01/19/17   Rogene Houston, MD Consulting Physician Gastroenterology  01/19/17   [name], [facility] Pharmacist Pharmacist  04/09/20    Comment: PHONE NUMBER [phone]     Health Maintenance  Topic Date Due  . INFLUENZA VACCINE  07/18/2020  . TETANUS/TDAP  01/13/2021 (Originally 07/29/2017)  . Hepatitis C Screening  07/19/2021 (Originally 08-14-1946)  . COLONOSCOPY  08/25/2025  . COVID-19 Vaccine  Completed  . PNA vac Low Risk Adult  Completed      Review of Systems Patient reports no vision/hearing changes, anorexia, fever ,adenopathy, persistant/recurrent hoarseness, swallowing issues, chest pain, palpitations, edema, persistant/recurrent cough, hemoptysis, dyspnea (rest,exertional, paroxysmal nocturnal), gastrointestinal  bleeding (melena, rectal bleeding), abdominal pain, excessive heart burn, GU symptoms (dysuria, hematuria, voiding/incontinence issues) syncope, focal weakness, memory loss, numbness & tingling, skin/hair/nail changes, depression, anxiety, abnormal bruising/bleeding, musculoskeletal symptoms/signs.   This visit occurred during the SARS-CoV-2 public health emergency.  Safety protocols were in place, including screening questions prior to the visit, additional usage of staff PPE, and extensive cleaning of exam room while observing appropriate contact time as indicated for disinfecting solutions.       Objective:   Physical Exam General Appearance:    Alert, cooperative, no  distress, appears stated age  Head:    Normocephalic, without obvious abnormality, atraumatic  Eyes:    PERRL, conjunctiva/corneas clear, EOM's intact, fundi    benign, both eyes       Ears:    Normal TM's and external ear canals, both ears  Nose:   Deferred due to COVID  Throat:   Neck:   Supple, symmetrical, trachea midline, no adenopathy;       thyroid:  No enlargement/tenderness/nodules  Back:     Symmetric, no curvature, ROM normal, no CVA tenderness  Lungs:     Clear to auscultation bilaterally, respirations unlabored  Chest wall:    No tenderness or deformity  Heart:    Regular rate and rhythm, S1 and S2 normal, no murmur, rub   or gallop  Abdomen:     Soft, non-tender, bowel sounds active all four quadrants,    no masses, no organomegaly  Genitalia:    deferred  Rectal:    Extremities:   Extremities normal, atraumatic, no cyanosis or edema  Pulses:   2+ and symmetric all extremities  Skin:   Skin color, texture, turgor normal, no rashes, lesion anterior to L ear  Lymph nodes:   Cervical, supraclavicular, and axillary nodes normal  Neurologic:   CNII-XII intact. Normal strength, sensation and reflexes      throughout          Assessment & Plan:

## 2020-07-20 ENCOUNTER — Encounter: Payer: Self-pay | Admitting: General Practice

## 2020-07-30 ENCOUNTER — Other Ambulatory Visit: Payer: Self-pay | Admitting: General Practice

## 2020-07-30 MED ORDER — SIMVASTATIN 40 MG PO TABS: 40.0000 mg | ORAL_TABLET | Freq: Every day | ORAL | 1 refills | Status: DC

## 2020-08-06 ENCOUNTER — Encounter: Payer: Self-pay | Admitting: Physician Assistant

## 2020-08-06 ENCOUNTER — Other Ambulatory Visit: Payer: Self-pay

## 2020-08-06 ENCOUNTER — Ambulatory Visit (INDEPENDENT_AMBULATORY_CARE_PROVIDER_SITE_OTHER): Payer: Medicare Other | Admitting: Physician Assistant

## 2020-08-06 VITALS — BP 120/68 | HR 59 | Temp 98.0°F | Wt 215.6 lb

## 2020-08-06 DIAGNOSIS — M545 Low back pain, unspecified: Secondary | ICD-10-CM

## 2020-08-06 MED ORDER — CYCLOBENZAPRINE HCL 10 MG PO TABS: 10.0000 mg | ORAL_TABLET | Freq: Every day | ORAL | 0 refills | Status: DC

## 2020-08-06 NOTE — Progress Notes (Signed)
Patient presents to clinic today c/o 2-1/2 days of right-sided low back pain, initially radiating around his right hip and into the thigh.  Denies any trauma or injury.  Notes symptoms did start after he pushed mowed the yard.  Denies any numbness, tingling or weakness of lower extremity.  Notes pain was quite significant yesterday but much improved today.  Has applied heating pad to the area and taken over-the-counter pain relievers.  Pain currently 2-3/10.  Past Medical History:  Diagnosis Date  . Cellulitis and abscess of other specified site   . Colon polyps   . Gout   . Hypertension   . Neuromuscular disorder (HCC)    arthritis  . Scoliosis     Current Outpatient Medications on File Prior to Visit  Medication Sig Dispense Refill  . allopurinol (ZYLOPRIM) 100 MG tablet TAKE 1 TABLET BY MOUTH EVERY DAY 90 tablet 1  . cetirizine (ZYRTEC) 10 MG tablet Take 1 tablet (10 mg total) by mouth daily. 30 tablet 11  . diclofenac (VOLTAREN) 75 MG EC tablet Take 1 tablet (75 mg total) by mouth 2 (two) times daily as needed. 60 tablet 3  . docusate sodium (COLACE) 100 MG capsule Take 1 capsule (100 mg total) by mouth 2 (two) times daily. To prevent constipation while taking pain medication. 60 capsule 0  . EPINEPHrine 0.3 mg/0.3 mL IJ SOAJ injection INJECT 0.3 MLS (0.3 MG TOTAL) INTO THE MUSCLE ONCE FOR 1 DOSE AS NEEDED  0  . fluticasone (FLONASE) 50 MCG/ACT nasal spray Place 2 sprays into both nostrils daily. 16 g 6  . gabapentin (NEURONTIN) 300 MG capsule Take 300 mg by mouth 3 (three) times daily.     Marland Kitchen HYDROcodone-acetaminophen (NORCO/VICODIN) 5-325 MG tablet Take 1 tablet by mouth every 6 (six) hours as needed. for pain  0  . lisinopril-hydrochlorothiazide (ZESTORETIC) 10-12.5 MG tablet Take 1 tablet by mouth daily. 90 tablet 1  . metoprolol succinate (TOPROL-XL) 25 MG 24 hr tablet Take 1 tablet (25 mg total) by mouth daily. 90 tablet 1  . simvastatin (ZOCOR) 40 MG tablet Take 1 tablet (40 mg  total) by mouth daily. 90 tablet 1   No current facility-administered medications on file prior to visit.    Allergies  Allergen Reactions  . Bee Venom Anaphylaxis    Family History  Problem Relation Age of Onset  . Kidney disease Sister   . Cancer Other        Family Hx of Cancer, CAD,Diabetes,Kidney Failure  . Colon cancer Neg Hx     Social History   Socioeconomic History  . Marital status: Married    Spouse name: Not on file  . Number of children: 2  . Years of education: Not on file  . Highest education level: Not on file  Occupational History  . 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 Smoking status: Former Smoker    Packs/day: 3.00    Years: 30.00    Pack years: 90.00    Types: Cigarettes    Start date: 12/18/1957    Quit date: 12/18/1996    Years since quitting: 23.6  . Smokeless tobacco: Never Used  . Tobacco comment: started smoking as a child  Vaping Use  . Vaping Use: Never used  Substance and Sexual Activity  . Alcohol use: Yes    Alcohol/week: 0.0 standard drinks    Comment: beer occasionally  . Drug use: No  . Sexual activity:  Not on file  Other Topics Concern  . Not on file  Social History Narrative   2 sons; 49 Grandchildren       Enjoys fishing    Social Determinants of Radio broadcast assistant Strain:   . Difficulty of Paying Living Expenses: Not on file  Food Insecurity: No Food Insecurity  . Worried About Charity fundraiser in the Last Year: Never true  . Ran Out of Food in the Last Year: Never true  Transportation Needs: No Transportation Needs  . Lack of Transportation (Medical): No  . Lack of Transportation (Non-Medical): No  Physical Activity: Sufficiently Active  . Days of Exercise per Week: 5 days  . Minutes of Exercise per Session: 30 min  Stress:   . Feeling of Stress : Not on file  Social Connections:   . Frequency of Communication with Friends and Family: Not on file  .  Frequency of Social Gatherings with Friends and Family: Not on file  . Attends Religious Services: Not on file  . Active Member of Clubs or Organizations: Not on file  . Attends Archivist Meetings: Not on file  . Marital Status: Not on file   Review of Systems - See HPI.  All other ROS are negative.  Wt 215 lb 9.6 oz (97.8 kg)   BMI 29.24 kg/m   Physical Exam Vitals reviewed.  Constitutional:      Appearance: Normal appearance.  HENT:     Head: Normocephalic and atraumatic.  Cardiovascular:     Rate and Rhythm: Normal rate and regular rhythm.     Pulses: Normal pulses.     Heart sounds: Normal heart sounds.  Pulmonary:     Effort: Pulmonary effort is normal.  Musculoskeletal:     Cervical back: Neck supple.     Thoracic back: Normal.     Lumbar back: Spasms and tenderness (Right-sided lumbar paraspinal musculature) present. No swelling, edema, deformity, signs of trauma or bony tenderness. Normal range of motion.  Neurological:     General: No focal deficit present.     Mental Status: He is alert and oriented to person, place, and time.     Recent Results (from the past 2160 hour(s))  Lipid panel     Status: Abnormal   Collection Time: 07/19/20 10:52 AM  Result Value Ref Range   Cholesterol 138 0 - 200 mg/dL    Comment: ATP III Classification       Desirable:  < 200 mg/dL               Borderline High:  200 - 239 mg/dL          High:  > = 240 mg/dL   Triglycerides 192.0 (H) 0 - 149 mg/dL    Comment: Normal:  <150 mg/dLBorderline High:  150 - 199 mg/dL   HDL 33.80 (L) >39.00 mg/dL   VLDL 38.4 0.0 - 40.0 mg/dL   LDL Cholesterol 66 0 - 99 mg/dL   Total CHOL/HDL Ratio 4     Comment:                Men          Women1/2 Average Risk     3.4          3.3Average Risk          5.0          4.42X Average Risk          9.6  7.13X Average Risk          15.0          11.0                       NonHDL 104.49     Comment: NOTE:  Non-HDL goal should be 30 mg/dL  higher than patient's LDL goal (i.e. LDL goal of < 70 mg/dL, would have non-HDL goal of < 100 mg/dL)  Basic metabolic panel     Status: Abnormal   Collection Time: 07/19/20 10:52 AM  Result Value Ref Range   Sodium 139 135 - 145 mEq/L   Potassium 4.9 3.5 - 5.1 mEq/L   Chloride 102 96 - 112 mEq/L   CO2 29 19 - 32 mEq/L   Glucose, Bld 85 70 - 99 mg/dL   BUN 23 6 - 23 mg/dL   Creatinine, Ser 1.29 0.40 - 1.50 mg/dL   GFR 54.81 (L) >60.00 mL/min   Calcium 9.5 8.4 - 10.5 mg/dL  TSH     Status: None   Collection Time: 07/19/20 10:52 AM  Result Value Ref Range   TSH 2.12 0.35 - 4.50 uIU/mL  Hepatic function panel     Status: None   Collection Time: 07/19/20 10:52 AM  Result Value Ref Range   Total Bilirubin 0.4 0.2 - 1.2 mg/dL   Bilirubin, Direct 0.1 0.0 - 0.3 mg/dL   Alkaline Phosphatase 105 39 - 117 U/L   AST 18 0 - 37 U/L   ALT 27 0 - 53 U/L   Total Protein 6.7 6.0 - 8.3 g/dL   Albumin 4.3 3.5 - 5.2 g/dL  CBC with Differential/Platelet     Status: None   Collection Time: 07/19/20 10:52 AM  Result Value Ref Range   WBC 7.8 4.0 - 10.5 K/uL   RBC 5.01 4.22 - 5.81 Mil/uL   Hemoglobin 14.7 13.0 - 17.0 g/dL   HCT 44.5 39 - 52 %   MCV 88.9 78.0 - 100.0 fl   MCHC 33.0 30.0 - 36.0 g/dL   RDW 13.9 11.5 - 15.5 %   Platelets 206.0 150 - 400 K/uL   Neutrophils Relative % 70.9 43 - 77 %   Lymphocytes Relative 17.9 12 - 46 %   Monocytes Relative 8.7 3 - 12 %   Eosinophils Relative 1.8 0 - 5 %   Basophils Relative 0.7 0 - 3 %   Neutro Abs 5.6 1.4 - 7.7 K/uL   Lymphs Abs 1.4 0.7 - 4.0 K/uL   Monocytes Absolute 0.7 0 - 1 K/uL   Eosinophils Absolute 0.1 0 - 0 K/uL   Basophils Absolute 0.1 0 - 0 K/uL  PSA, Medicare     Status: None   Collection Time: 07/19/20 10:52 AM  Result Value Ref Range   PSA 0.40 0.10 - 4.00 ng/ml    Comment: Test performed using Access [employer]britech PSA Assay, a parmagnetic partical, chemiluminecent immunoassay.    Assessment/Plan: 1. Acute right-sided low back pain  without sciatica Atraumatic.  Examination without any bony tenderness.  Right-sided paraspinal muscular tenderness and spasm noted.  Avoid heavy lifting and overexertion.  Can continue OTC analgesics.  Continue use of heating pad.  Will Rx short course of Flexeril to use in the evening for the next couple of nights to help calm down muscle tension and spasm.  Stretching exercises reviewed.  Handout given.  Return precautions reviewed with patient.  Patient voiced understanding and agreement with plan. -  cyclobenzaprine (FLEXERIL) 10 MG tablet; Take 1 tablet (10 mg total) by mouth at bedtime.  Dispense: 10 tablet; Refill: 0  This visit occurred during the SARS-CoV-2 public health emergency.  Safety protocols were in place, including screening questions prior to the visit, additional usage of staff PPE, and extensive cleaning of exam room while observing appropriate contact time as indicated for disinfecting solutions.     Leeanne Rio, PA-C

## 2020-08-06 NOTE — Patient Instructions (Signed)
Please avoid heavy lifting and overexertion. Apply heating pad to the lower back for 10-15 minutes, a few times per day.  Ok to continue your OTC medications. I am adding on a Flexeril to use in the evening for the next couple of days to help relax the significant muscle tension in the lower back.  Let me know if things are not continuing to resolve.    Low Back Sprain or Strain Rehab Ask your health care provider which exercises are safe for you. Do exercises exactly as told by your health care provider and adjust them as directed. It is normal to feel mild stretching, pulling, tightness, or discomfort as you do these exercises. Stop right away if you feel sudden pain or your pain gets worse. Do not begin these exercises until told by your health care provider. Stretching and range-of-motion exercises These exercises warm up your muscles and joints and improve the movement and flexibility of your back. These exercises also help to relieve pain, numbness, and tingling. Lumbar rotation  1. Lie on your back on a firm surface and bend your knees. 2. Straighten your arms out to your sides so each arm forms a 90-degree angle (right angle) with a side of your body. 3. Slowly move (rotate) both of your knees to one side of your body until you feel a stretch in your lower back (lumbar). Try not to let your shoulders lift off the floor. 4. Hold this position for __________ seconds. 5. Tense your abdominal muscles and slowly move your knees back to the starting position. 6. Repeat this exercise on the other side of your body. Repeat __________ times. Complete this exercise __________ times a day. Single knee to chest  1. Lie on your back on a firm surface with both legs straight. 2. Bend one of your knees. Use your hands to move your knee up toward your chest until you feel a gentle stretch in your lower back and buttock. ? Hold your leg in this position by holding on to the front of your  knee. ? Keep your other leg as straight as possible. 3. Hold this position for __________ seconds. 4. Slowly return to the starting position. 5. Repeat with your other leg. Repeat __________ times. Complete this exercise __________ times a day. Prone extension on elbows  1. Lie on your abdomen on a firm surface (prone position). 2. Prop yourself up on your elbows. 3. Use your arms to help lift your chest up until you feel a gentle stretch in your abdomen and your lower back. ? This will place some of your body weight on your elbows. If this is uncomfortable, try stacking pillows under your chest. ? Your hips should stay down, against the surface that you are lying on. Keep your hip and back muscles relaxed. 4. Hold this position for __________ seconds. 5. Slowly relax your upper body and return to the starting position. Repeat __________ times. Complete this exercise __________ times a day. Strengthening exercises These exercises build strength and endurance in your back. Endurance is the ability to use your muscles for a long time, even after they get tired. Pelvic tilt This exercise strengthens the muscles that lie deep in the abdomen. 1. Lie on your back on a firm surface. Bend your knees and keep your feet flat on the floor. 2. Tense your abdominal muscles. Tip your pelvis up toward the ceiling and flatten your lower back into the floor. ? To help with this exercise, you may  place a small towel under your lower back and try to push your back into the towel. 3. Hold this position for __________ seconds. 4. Let your muscles relax completely before you repeat this exercise. Repeat __________ times. Complete this exercise __________ times a day. Alternating arm and leg raises  1. Get on your hands and knees on a firm surface. If you are on a hard floor, you may want to use padding, such as an exercise mat, to cushion your knees. 2. Line up your arms and legs. Your hands should be  directly below your shoulders, and your knees should be directly below your hips. 3. Lift your left leg behind you. At the same time, raise your right arm and straighten it in front of you. ? Do not lift your leg higher than your hip. ? Do not lift your arm higher than your shoulder. ? Keep your abdominal and back muscles tight. ? Keep your hips facing the ground. ? Do not arch your back. ? Keep your balance carefully, and do not hold your breath. 4. Hold this position for __________ seconds. 5. Slowly return to the starting position. 6. Repeat with your right leg and your left arm. Repeat __________ times. Complete this exercise __________ times a day. Abdominal set with straight leg raise  1. Lie on your back on a firm surface. 2. Bend one of your knees and keep your other leg straight. 3. Tense your abdominal muscles and lift your straight leg up, 4-6 inches (10-15 cm) off the ground. 4. Keep your abdominal muscles tight and hold this position for __________ seconds. ? Do not hold your breath. ? Do not arch your back. Keep it flat against the ground. 5. Keep your abdominal muscles tense as you slowly lower your leg back to the starting position. 6. Repeat with your other leg. Repeat __________ times. Complete this exercise __________ times a day. Single leg lower with bent knees 1. Lie on your back on a firm surface. 2. Tense your abdominal muscles and lift your feet off the floor, one foot at a time, so your knees and hips are bent in 90-degree angles (right angles). ? Your knees should be over your hips and your lower legs should be parallel to the floor. 3. Keeping your abdominal muscles tense and your knee bent, slowly lower one of your legs so your toe touches the ground. 4. Lift your leg back up to return to the starting position. ? Do not hold your breath. ? Do not let your back arch. Keep your back flat against the ground. 5. Repeat with your other leg. Repeat __________  times. Complete this exercise __________ times a day. Posture and body mechanics Good posture and healthy body mechanics can help to relieve stress in your body's tissues and joints. Body mechanics refers to the movements and positions of your body while you do your daily activities. Posture is part of body mechanics. Good posture means:  Your spine is in its natural S-curve position (neutral).  Your shoulders are pulled back slightly.  Your head is not tipped forward. Follow these guidelines to improve your posture and body mechanics in your everyday activities. Standing   When standing, keep your spine neutral and your feet about hip width apart. Keep a slight bend in your knees. Your ears, shoulders, and hips should line up.  When you do a task in which you stand in one place for a long time, place one foot up on a stable  object that is 2-4 inches (5-10 cm) high, such as a footstool. This helps keep your spine neutral. Sitting   When sitting, keep your spine neutral and keep your feet flat on the floor. Use a footrest, if necessary, and keep your thighs parallel to the floor. Avoid rounding your shoulders, and avoid tilting your head forward.  When working at a desk or a computer, keep your desk at a height where your hands are slightly lower than your elbows. Slide your chair under your desk so you are close enough to maintain good posture.  When working at a computer, place your monitor at a height where you are looking straight ahead and you do not have to tilt your head forward or downward to look at the screen. Resting  When lying down and resting, avoid positions that are most painful for you.  If you have pain with activities such as sitting, bending, stooping, or squatting, lie in a position in which your body does not bend very much. For example, avoid curling up on your side with your arms and knees near your chest (fetal position).  If you have pain with activities such as  standing for a long time or reaching with your arms, lie with your spine in a neutral position and bend your knees slightly. Try the following positions: ? Lying on your side with a pillow between your knees. ? Lying on your back with a pillow under your knees. Lifting   When lifting objects, keep your feet at least shoulder width apart and tighten your abdominal muscles.  Bend your knees and hips and keep your spine neutral. It is important to lift using the strength of your legs, not your back. Do not lock your knees straight out.  Always ask for help to lift heavy or awkward objects. This information is not intended to replace advice given to you by your health care provider. Make sure you discuss any questions you have with your health care provider. Document Revised: 03/28/2019 Document Reviewed: 12/26/2018 Elsevier Patient Education  [place].

## 2020-08-12 ENCOUNTER — Encounter (INDEPENDENT_AMBULATORY_CARE_PROVIDER_SITE_OTHER): Payer: Self-pay | Admitting: *Deleted

## 2020-08-12 DIAGNOSIS — I10 Essential (primary) hypertension: Secondary | ICD-10-CM | POA: Diagnosis not present

## 2020-08-12 DIAGNOSIS — Z6828 Body mass index (BMI) 28.0-28.9, adult: Secondary | ICD-10-CM | POA: Diagnosis not present

## 2020-09-09 DIAGNOSIS — L989 Disorder of the skin and subcutaneous tissue, unspecified: Secondary | ICD-10-CM | POA: Diagnosis not present

## 2020-09-09 DIAGNOSIS — D485 Neoplasm of uncertain behavior of skin: Secondary | ICD-10-CM | POA: Diagnosis not present

## 2020-09-09 DIAGNOSIS — L819 Disorder of pigmentation, unspecified: Secondary | ICD-10-CM | POA: Diagnosis not present

## 2020-09-09 DIAGNOSIS — C44319 Basal cell carcinoma of skin of other parts of face: Secondary | ICD-10-CM | POA: Diagnosis not present

## 2020-09-09 DIAGNOSIS — R238 Other skin changes: Secondary | ICD-10-CM | POA: Diagnosis not present

## 2020-09-09 DIAGNOSIS — L570 Actinic keratosis: Secondary | ICD-10-CM | POA: Diagnosis not present

## 2020-09-27 DIAGNOSIS — C44319 Basal cell carcinoma of skin of other parts of face: Secondary | ICD-10-CM | POA: Diagnosis not present

## 2020-10-15 ENCOUNTER — Telehealth: Payer: Self-pay | Admitting: Family Medicine

## 2020-10-15 ENCOUNTER — Other Ambulatory Visit: Payer: Self-pay

## 2020-10-15 MED ORDER — ALLOPURINOL 100 MG PO TABS
100.0000 mg | ORAL_TABLET | Freq: Every day | ORAL | 1 refills | Status: DC
Start: 2020-10-15 — End: 2021-01-17

## 2020-10-15 NOTE — Telephone Encounter (Signed)
Called and spoke with patient's wife to inform that the Allopurinol is the only medication that her husband is due for. She voiced understanding. Medication was sent to patient's preferred pharmacy.

## 2020-10-15 NOTE — Telephone Encounter (Signed)
..  Medication Refills  Medication:  Allpurinol, Lisinopril, Metoprol,  Pharmacy:  [facility]  ** Let patient know to contact pharmacy at the end of the day to make sure medication is ready.**  ** Please notify patient to allow 48-72 hours to process.**  ** Encourage patient to contact the pharmacy for refills or they can request refills through [facility]**  Clinical Fills out below:   Last refill:  QTY:  Refill Date:    Other Comments:   Okay for refill?  Please advise.

## 2020-11-25 ENCOUNTER — Ambulatory Visit: Payer: Medicare Other

## 2020-11-25 NOTE — Chronic Care Management (AMB) (Signed)
  Chronic Care Management   Outreach Note   Name: [name] MRN: [id number] DOB: Feb 18, 1946  Referred by: Midge Minium, MD Reason for referral: Telephone Appointment with [place] Pharmacist, [name].   Telephone appointment with clinical pharmacist today (11/25/2020) at 1pm - pt not home. Spoke with pt's wife, [name] - [place] for tomorrow at [place], Pharm.D., BCGP Clinical Pharmacist [facility] [phone]

## 2020-11-26 ENCOUNTER — Ambulatory Visit: Payer: Medicare Other

## 2020-11-26 DIAGNOSIS — E785 Hyperlipidemia, unspecified: Secondary | ICD-10-CM

## 2020-11-26 DIAGNOSIS — I10 Essential (primary) hypertension: Secondary | ICD-10-CM

## 2020-11-26 NOTE — Patient Instructions (Signed)
[name],  Thank you for taking the time to review your medications with me today.  I have included our care plan/goals in the following pages. Please review and call me at [phone] with any questions!  Thanks! Ellin Mayhew, Pharm.D., BCGP Clinical Pharmacist [facility] [phone]  Goals Addressed            This Visit's Progress   . PharmD Care Plan   On track    CARE PLAN ENTRY  Current Barriers:  . Chronic Disease Management support, education, and care coordination needs related to Hypertension, Hyperlipidemia, and Gout   Hypertension . Pharmacist Clinical Goal(s): o Over the next 180 days, patient will work with PharmD and providers to maintain BP goal <130/80 . Current regimen:  o Lisinopril-hydrochlorothiazide 10-12.5 mg daily o Metoprolol succinate 25 mg daily  . Interventions: o Continue current management . Patient self care activities - Over the next 180 days, patient will: o Check BP at least once every 1-2 weeks, document, and provide at future appointments o Ensure daily salt intake < 2300 mg/day  Hyperlipidemia . Pharmacist Clinical Goal(s): o Over the next 180 days, patient will work with PharmD and providers to maintain LDL goal < 70 . Current regimen:  o Simvastatin 40 mg daily  . Interventions: o Continue current management . Patient self care activities - Over the next 180 days, patient will: o Continue current management  Gout . Pharmacist Clinical Goal(s) o Over the next 180 days, patient will work with PharmD and providers to minimize gout attacks . Current regimen:  o Allopurinol 100 mg daily  . Interventions: o Continue current medication management o Diet recommendations . Patient self care activities - Over the next 180 days, patient will: o Continue allopurinol  o Maintain healthy diet  Medication management . Pharmacist Clinical Goal(s): o Over the next 180 days, patient will work  with PharmD and providers to maintain optimal medication adherence . Current pharmacy: [facility] . Interventions o Comprehensive medication review performed. o Continue current medication management strategy . Patient self care activities - Over the next 180 days, patient will: o Take medications as prescribed o Report any questions or concerns to PharmD and/or provider(s) Initial goal documentation.       The patient verbalized understanding of instructions provided today and agreed to receive a mailed copy of patient instruction and/or educational materials. Telephone follow up appointment with pharmacy team member scheduled for: See next appointment with "Care Management Staff" under "What's Next" below.

## 2020-11-26 NOTE — Progress Notes (Signed)
Chronic Care Management Pharmacy  Name: [name]  MRN: [id number] DOB: 12-25-1945  Chief Complaint/ HPI  [name],  74 y.o. , male presents for their Follow-Up CCM visit with the clinical pharmacist via telephone due to COVID-19 Pandemic.   PCP : Steven Minium, MD 08/06/2020 Einar Pheasant): acute back pain - flexeril. Stressed need for f/u with derm - skin cancer. Td at pharmacy. 07/19/2020 (PCP):  Encounter Diagnoses  Name Primary?  . Hyperlipidemia, unspecified hyperlipidemia type Yes  . Essential hypertension    Patient Active Problem List   Diagnosis Date Noted  . Overweight (BMI 25.0-29.9) 01/14/2020  . Physical exam 07/11/2019  . Primary osteoarthritis of left knee 10/22/2017  . Gout 01/19/2017  . Chest pain 09/01/2014  . Coronary artery disease excluded 05/20/2012  . Hyperlipidemia 04/29/2011  . Essential hypertension 11/13/2007   Past Surgical History:  Procedure Laterality Date  . BACK SURGERY    . COLONOSCOPY  01/10/2013   Procedure: COLONOSCOPY;  Surgeon: Rogene Houston, MD;  [facility];  Service: Endoscopy;  Laterality: N/A;  730  . COLONOSCOPY N/A 08/21/2013   Procedure: COLONOSCOPY;  Surgeon: Rogene Houston, MD;  [facility];  Service: Endoscopy;  Laterality: N/A;  1030  . COLONOSCOPY N/A 08/26/2015   Procedure: COLONOSCOPY;  Surgeon: Rogene Houston, MD;  [facility];  Service: Endoscopy;  Laterality: N/A;  240  . colonscopy    . PARTIAL KNEE ARTHROPLASTY Left 11/06/2017   Procedure: UNICOMPARTMENTAL KNEE;  Surgeon: [name], MD;  Location: [facility];  Service: Orthopedics;  Laterality: Left;   Social History   Socioeconomic History  . Marital status: Married    Spouse name: Not on file  . Number of children: 2  . Years of education: Not on file  . Highest education level: Not on file  Occupational History  . 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 Smoking  status: Former Smoker    Packs/day: 3.00    Years: 30.00    Pack years: 90.00    Types: Cigarettes    Start date: 12/18/1957    Quit date: 12/18/1996    Years since quitting: 23.9  . Smokeless tobacco: Never Used  . Tobacco comment: started smoking as a child  Vaping Use  . Vaping Use: Never used  Substance and Sexual Activity  . Alcohol use: Yes    Alcohol/week: 0.0 standard drinks    Comment: beer occasionally  . Drug use: No  . Sexual activity: Not on file  Other Topics Concern  . Not on file  Social History Narrative   2 sons; 4 Grandchildren       Enjoys fishing    Social Determinants of Radio broadcast assistant Strain: Not on file  Food Insecurity: No Food Insecurity  . Worried About Charity fundraiser in the Last Year: Never true  . Ran Out of Food in the Last Year: Never true  Transportation Needs: No Transportation Needs  . Lack of Transportation (Medical): No  . Lack of Transportation (Non-Medical): No  Physical Activity: Sufficiently Active  . Days of Exercise per Week: 5 days  . Minutes of Exercise per Session: 30 min  Stress: Not on file  Social Connections: Not on file   Family History  Problem Relation Age of Onset  . Kidney disease Sister   . Cancer Other  Family Hx of Cancer, CAD,Diabetes,Kidney Failure  . Colon cancer Neg Hx    Allergies  Allergen Reactions  . Bee Venom Anaphylaxis   Outpatient Encounter Medications as of 11/26/2020  Medication Sig  . allopurinol (ZYLOPRIM) 100 MG tablet Take 1 tablet (100 mg total) by mouth daily.  . cetirizine (ZYRTEC) 10 MG tablet Take 1 tablet (10 mg total) by mouth daily.  . cyclobenzaprine (FLEXERIL) 10 MG tablet Take 1 tablet (10 mg total) by mouth at bedtime.  . diclofenac (VOLTAREN) 75 MG EC tablet Take 1 tablet (75 mg total) by mouth 2 (two) times daily as needed.  . docusate sodium (COLACE) 100 MG capsule Take 1 capsule (100 mg total) by mouth 2 (two) times daily. To prevent constipation  while taking pain medication.  Marland Kitchen EPINEPHrine 0.3 mg/0.3 mL IJ SOAJ injection INJECT 0.3 MLS (0.3 MG TOTAL) INTO THE MUSCLE ONCE FOR 1 DOSE AS NEEDED  . fluticasone (FLONASE) 50 MCG/ACT nasal spray Place 2 sprays into both nostrils daily.  Marland Kitchen gabapentin (NEURONTIN) 300 MG capsule Take 300 mg by mouth 3 (three) times daily.   Marland Kitchen HYDROcodone-acetaminophen (NORCO/VICODIN) 5-325 MG tablet Take 1 tablet by mouth every 6 (six) hours as needed. for pain  . lisinopril-hydrochlorothiazide (ZESTORETIC) 10-12.5 MG tablet Take 1 tablet by mouth daily.  . metoprolol succinate (TOPROL-XL) 25 MG 24 hr tablet Take 1 tablet (25 mg total) by mouth daily.  . simvastatin (ZOCOR) 40 MG tablet Take 1 tablet (40 mg total) by mouth daily.   No facility-administered encounter medications on file as of 11/26/2020.   Patient Care Team    Relationship Specialty Notifications Start End  Steven Minium, MD PCP - General Family Medicine  01/19/17   Jovita Gamma, MD Consulting Physician Neurosurgery  01/19/17   Herminio Commons, MD (Inactive) Attending Physician Cardiology  01/19/17   Rogene Houston, MD Consulting Physician Gastroenterology  01/19/17   [name], [facility] Pharmacist Pharmacist  04/09/20    Comment: PHONE NUMBER [phone]   Current Diagnosis/Assessment: Goals Addressed            This Visit's Progress   . PharmD Care Plan   On track    CARE PLAN ENTRY  Current Barriers:  . Chronic Disease Management support, education, and care coordination needs related to Hypertension, Hyperlipidemia, and Gout   Hypertension . Pharmacist Clinical Goal(s): o Over the next 180 days, patient will work with PharmD and providers to maintain BP goal <130/80 . Current regimen:  o Lisinopril-hydrochlorothiazide 10-12.5 mg daily o Metoprolol succinate 25 mg daily  . Interventions: o Continue current management . Patient self care activities - Over the next 180 days, patient will: o Check BP at least once every  1-2 weeks, document, and provide at future appointments o Ensure daily salt intake < 2300 mg/day  Hyperlipidemia . Pharmacist Clinical Goal(s): o Over the next 180 days, patient will work with PharmD and providers to maintain LDL goal < 70 . Current regimen:  o Simvastatin 40 mg daily  . Interventions: o Continue current management . Patient self care activities - Over the next 180 days, patient will: o Continue current management  Gout . Pharmacist Clinical Goal(s) o Over the next 180 days, patient will work with PharmD and providers to minimize gout attacks . Current regimen:  o Allopurinol 100 mg daily  . Interventions: o Continue current medication management o Diet recommendations . Patient self care activities - Over the next 180 days, patient will: o Continue allopurinol  o Maintain healthy diet  Medication management . Pharmacist Clinical Goal(s): o Over the next 180 days, patient will work with PharmD and providers to maintain optimal medication adherence . Current pharmacy: [facility] . Interventions o Comprehensive medication review performed. o Continue current medication management strategy . Patient self care activities - Over the next 180 days, patient will: o Take medications as prescribed o Report any questions or concerns to PharmD and/or provider(s) Initial goal documentation.       Hypertension   BP goal <130/80  BP Readings from Last 3 Encounters:  08/06/20 120/68  07/19/20 122/81  01/14/20 126/72   BMP Latest Ref Rng & Units 07/19/2020 01/14/2020 07/11/2019  Glucose 70 - 99 mg/dL 85 88 76  BUN 6 - 23 mg/dL 23 24(H) 25(H)  Creatinine 0.40 - 1.50 mg/dL 1.29 1.19 1.29  Sodium 135 - 145 mEq/L 139 139 140  Potassium 3.5 - 5.1 mEq/L 4.9 4.3 4.6  Chloride 96 - 112 mEq/L 102 103 103  CO2 19 - 32 mEq/L 29 30 30   Calcium 8.4 - 10.5 mg/dL 9.5 9.0 9.7   Does not check BP at home.  Denies dizziness or fatigue. Continues to be very active outdoors,  including yard work walking and fishing. Patient is currently controlled on the following medications:  . Lisinopril-hydrochlorothiazide 10-12.5 mg daily  . Metoprolol succinate 25 mg daily   Plan  Continue current medications and control with diet and exercise.     Hyperlipidemia   LDL goal <70      Component Value Date/Time   CHOL 138 07/19/2020 1052   TRIG 192.0 (H) 07/19/2020 1052   HDL 33.80 (L) 07/19/2020 1052   LDLCALC 66 07/19/2020 1052   LDLDIRECT 71.0 01/14/2020 0906   The 10-year ASCVD risk score Mikey Bussing DC Jr., et al., 2013) is: 19.9%   Values used to calculate the score:     Age: 54 years     Sex: Male     Is Non-Hispanic African American: No     Diabetic: No     Tobacco smoker: No     Systolic Blood Pressure: 496 mmHg     Is BP treated: Yes     HDL Cholesterol: 33.8 mg/dL     Total Cholesterol: 138 mg/dL   Denies any side effects LDL well controled 07/2020. Patient is currently controlled on the following medications:  . Simvastatin 40 mg daily  Reviewed diet - Maintain a healthy weight and exercise regularly, as directed by your health care provider. Eat healthy foods, such as: Lean proteins, complex carbohydrates, fresh fruits and vegetables, low-fat dairy products, healthy fats.  Plan  Continue current medications   Gout    Lab Results  Component Value Date   LABURIC 7.4 01/19/2017   Continues to avoid seafood and alcohol. No other dietary changes noted. Has not had any issues with gout.  Patient is currently controlled on the following medications:  . Allopurinol 100 mg daily   Reviewed low purine diet.   Plan  Continue current medications and control with diet and exercise.   Pain   States now only using diclofenac when he truly needs it, was 1 tab/day at last visit.  Denies any side effects at this time. Patient is currently controlled on the following medications:  . Diclofenac 75 mg EC twice daily as needed  . Hydrocodone-acetaminophen  5-325 - 1 tablet every 6 hours as needed for pain . Gabapentin 300 mg three times daily - taking once daily  most days  Plan  Continue current medications  Vaccines   Immunization History  Administered Date(s) Administered  . Moderna SARS-COVID-2 Vaccination 02/19/2020, 03/23/2020  . Pneumococcal Conjugate-13 07/18/2017  . Pneumococcal Polysaccharide-23 12/30/2018  . Tdap 07/30/2007  . Zoster 01/19/2015   Had not received moderna booster - this was encouraged today, pt plans to receive.   Plan  Pt to receive moderna booster and tdap vaccine at pharmacy  Medication Management   Receives prescription medications from:  [place], [address] [place] [address] Phone: [phone] Fax: [phone]   Denies any issues with current medication management.   Plan  Continue current medication management strategy.  Follow up: 10 month [place] phone visit.  Future Appointments  Date Time Provider [place]  01/17/2021  8:30 AM Steven Minium, MD [facility]  09/30/2021  1:30 PM [facility] CCM PHARMACIST [facility]   ______________ Visit Information SDOH (Social Determinants of Health) assessments performed: Yes.  [name], Pharm.D., BCGP Clinical Pharmacist [facility] [phone]

## 2020-12-06 ENCOUNTER — Telehealth: Payer: Self-pay

## 2020-12-06 ENCOUNTER — Telehealth: Payer: Self-pay | Admitting: Family Medicine

## 2020-12-06 ENCOUNTER — Other Ambulatory Visit: Payer: Self-pay

## 2020-12-06 DIAGNOSIS — I10 Essential (primary) hypertension: Secondary | ICD-10-CM

## 2020-12-06 MED ORDER — METOPROLOL SUCCINATE ER 25 MG PO TB24: 25.0000 mg | ORAL_TABLET | Freq: Every day | ORAL | 0 refills | Status: DC

## 2020-12-06 NOTE — Telephone Encounter (Signed)
Rx sent to pharmacy   

## 2020-12-06 NOTE — Telephone Encounter (Signed)
Spoke with patient about Rx. Metoprolol sent to pharmacy.

## 2020-12-06 NOTE — Telephone Encounter (Signed)
...  Medication Refills  Medication:  Metoprolol 25 mg.  Pharmacy:[facility]  ** Let patient know to contact pharmacy at the end of the day to make sure medication is ready.**  ** Please notify patient to allow 48-72 hours to process.**  ** Encourage patient to contact the pharmacy for refills or they can request refills through [facility]**  Clinical Fills out below:   Last refill:  QTY:  Refill Date:    Other Comments:   Okay for refill?  Please advise.

## 2020-12-27 DIAGNOSIS — L905 Scar conditions and fibrosis of skin: Secondary | ICD-10-CM | POA: Diagnosis not present

## 2020-12-27 DIAGNOSIS — L821 Other seborrheic keratosis: Secondary | ICD-10-CM | POA: Diagnosis not present

## 2020-12-27 DIAGNOSIS — L814 Other melanin hyperpigmentation: Secondary | ICD-10-CM | POA: Diagnosis not present

## 2020-12-27 DIAGNOSIS — D229 Melanocytic nevi, unspecified: Secondary | ICD-10-CM | POA: Diagnosis not present

## 2021-01-05 ENCOUNTER — Other Ambulatory Visit: Payer: Self-pay | Admitting: Family Medicine

## 2021-01-10 ENCOUNTER — Ambulatory Visit: Payer: Medicare Other | Admitting: Physician Assistant

## 2021-01-17 ENCOUNTER — Other Ambulatory Visit: Payer: Self-pay

## 2021-01-17 ENCOUNTER — Encounter: Payer: Self-pay | Admitting: Family Medicine

## 2021-01-17 ENCOUNTER — Ambulatory Visit (INDEPENDENT_AMBULATORY_CARE_PROVIDER_SITE_OTHER): Payer: Medicare Other | Admitting: Family Medicine

## 2021-01-17 VITALS — BP 122/74 | HR 67 | Temp 98.9°F | Resp 20 | Ht 72.0 in | Wt 213.8 lb

## 2021-01-17 DIAGNOSIS — E663 Overweight: Secondary | ICD-10-CM

## 2021-01-17 DIAGNOSIS — E785 Hyperlipidemia, unspecified: Secondary | ICD-10-CM | POA: Diagnosis not present

## 2021-01-17 DIAGNOSIS — I10 Essential (primary) hypertension: Secondary | ICD-10-CM | POA: Diagnosis not present

## 2021-01-17 LAB — HEPATIC FUNCTION PANEL
ALT: 26 U/L (ref 0–53)
AST: 17 U/L (ref 0–37)
Albumin: 4.3 g/dL (ref 3.5–5.2)
Alkaline Phosphatase: 101 U/L (ref 39–117)
Bilirubin, Direct: 0.1 mg/dL (ref 0.0–0.3)
Total Bilirubin: 0.5 mg/dL (ref 0.2–1.2)
Total Protein: 7.1 g/dL (ref 6.0–8.3)

## 2021-01-17 LAB — CBC WITH DIFFERENTIAL/PLATELET
Basophils Absolute: 0.1 10*3/uL (ref 0.0–0.1)
Basophils Relative: 0.8 % (ref 0.0–3.0)
Eosinophils Absolute: 0.2 10*3/uL (ref 0.0–0.7)
Eosinophils Relative: 2.2 % (ref 0.0–5.0)
HCT: 46.3 % (ref 39.0–52.0)
Hemoglobin: 15.1 g/dL (ref 13.0–17.0)
Lymphocytes Relative: 18.5 % (ref 12.0–46.0)
Lymphs Abs: 1.5 10*3/uL (ref 0.7–4.0)
MCHC: 32.7 g/dL (ref 30.0–36.0)
MCV: 86 fl (ref 78.0–100.0)
Monocytes Absolute: 0.8 10*3/uL (ref 0.1–1.0)
Monocytes Relative: 9.6 % (ref 3.0–12.0)
Neutro Abs: 5.6 10*3/uL (ref 1.4–7.7)
Neutrophils Relative %: 68.9 % (ref 43.0–77.0)
Platelets: 201 10*3/uL (ref 150.0–400.0)
RBC: 5.38 Mil/uL (ref 4.22–5.81)
RDW: 13.8 % (ref 11.5–15.5)
WBC: 8.2 10*3/uL (ref 4.0–10.5)

## 2021-01-17 LAB — BASIC METABOLIC PANEL
BUN: 25 mg/dL — ABNORMAL HIGH (ref 6–23)
CO2: 27 mEq/L (ref 19–32)
Calcium: 9.7 mg/dL (ref 8.4–10.5)
Chloride: 104 mEq/L (ref 96–112)
Creatinine, Ser: 1.36 mg/dL (ref 0.40–1.50)
GFR: 52.13 mL/min — ABNORMAL LOW (ref >60.00)
Glucose, Bld: 91 mg/dL (ref 70–99)
Potassium: 4 mEq/L (ref 3.5–5.1)
Sodium: 139 mEq/L (ref 135–145)

## 2021-01-17 LAB — LIPID PANEL
Cholesterol: 117 mg/dL (ref 0–200)
HDL: 33 mg/dL — ABNORMAL LOW (ref >39.00)
LDL Cholesterol: 59 mg/dL (ref 0–99)
NonHDL: 84.49
Total CHOL/HDL Ratio: 4
Triglycerides: 125 mg/dL (ref 0.0–149.0)
VLDL: 25 mg/dL (ref 0.0–40.0)

## 2021-01-17 LAB — TSH: TSH: 2.74 u[IU]/mL (ref 0.35–4.50)

## 2021-01-17 MED ORDER — ALLOPURINOL 100 MG PO TABS
100.0000 mg | ORAL_TABLET | Freq: Every day | ORAL | 1 refills | Status: DC
Start: 2021-01-17 — End: 2021-07-26

## 2021-01-17 MED ORDER — CETIRIZINE HCL 10 MG PO TABS: 10.0000 mg | ORAL_TABLET | Freq: Every day | ORAL | 3 refills | Status: DC

## 2021-01-17 MED ORDER — LISINOPRIL-HYDROCHLOROTHIAZIDE 10-12.5 MG PO TABS: 1.0000 | ORAL_TABLET | Freq: Every day | ORAL | 1 refills | Status: DC

## 2021-01-17 MED ORDER — SIMVASTATIN 40 MG PO TABS: 40.0000 mg | ORAL_TABLET | Freq: Every day | ORAL | 1 refills | Status: DC

## 2021-01-17 MED ORDER — METOPROLOL SUCCINATE ER 25 MG PO TB24: 25.0000 mg | ORAL_TABLET | Freq: Every day | ORAL | 1 refills | Status: DC

## 2021-01-17 NOTE — Assessment & Plan Note (Signed)
Chronic problem.  Well controlled on Lisinopril HCTZ 10/12.5mg  daily and Metoprolol XL 25mg  daily.  Currently asymptomatic.  Check labs.  No anticipated med changes.  Will follow.

## 2021-01-17 NOTE — Assessment & Plan Note (Signed)
Ongoing issue for pt.  BMI is 29.  Reviewed his current diet and recommended areas for improvement.  Also encouraged regular physical activity.  Will follow.

## 2021-01-17 NOTE — Assessment & Plan Note (Signed)
Chronic problem.  On Simvastatin 40mg daily w/o difficulty.  Check labs.  Adjust meds prn  

## 2021-01-17 NOTE — Patient Instructions (Signed)
Schedule your complete physical in 6 months We'll notify you of your lab results and make any changes if needed Continue to work on healthy diet and regular exercise- you can do it! Call with any questions or concerns Stay Safe!  Stay Healthy! 

## 2021-01-17 NOTE — Progress Notes (Signed)
   Subjective:    Patient ID: [name], male    DOB: 10-14-1946, 75 y.o.   MRN: [id number]  HPI HTN- chronic problem, on Lisinopril HCTZ 10/12.5mg  daily and Metoprolol XL 25mg  daily w/ good control.  No CP, SOB, HAs, visual changes, edema.  Hyperlipidemia- chronic problem.  On Simvastatin 40mg  daily.  No abd pain, N/V.  Overweight- ongoing issue for pt.  Down 3 lbs since last visit.  Not exercising recently due to weather.  Not following specific diet.  Has a hunny bun for breakfast every morning, for lunch will have hot dog or hamburger and soda/sweet tea.   Review of Systems For ROS see HPI   This visit occurred during the SARS-CoV-2 public health emergency.  Safety protocols were in place, including screening questions prior to the visit, additional usage of staff PPE, and extensive cleaning of exam room while observing appropriate contact time as indicated for disinfecting solutions.       Objective:   Physical Exam Vitals reviewed.  Constitutional:      General: He is not in acute distress.    Appearance: Normal appearance. He is well-developed and well-nourished.  HENT:     Head: Normocephalic and atraumatic.  Eyes:     Extraocular Movements: EOM normal.     Conjunctiva/sclera: Conjunctivae normal.     Pupils: Pupils are equal, round, and reactive to light.  Neck:     Thyroid: No thyromegaly.  Cardiovascular:     Rate and Rhythm: Normal rate and regular rhythm.     Pulses: Normal pulses and intact distal pulses.     Heart sounds: Normal heart sounds. No murmur heard.   Pulmonary:     Effort: Pulmonary effort is normal. No respiratory distress.     Breath sounds: Normal breath sounds.  Abdominal:     General: Bowel sounds are normal. There is no distension.     Palpations: Abdomen is soft.  Musculoskeletal:        General: No edema.     Cervical back: Normal range of motion and neck supple.     Right lower leg: No edema.     Left lower leg: No edema.   Lymphadenopathy:     Cervical: No cervical adenopathy.  Skin:    General: Skin is warm and dry.  Neurological:     Mental Status: He is alert and oriented to person, place, and time.     Cranial Nerves: No cranial nerve deficit.  Psychiatric:        Mood and Affect: Mood and affect normal.        Behavior: Behavior normal.           Assessment & Plan:

## 2021-01-18 NOTE — Progress Notes (Signed)
Called pt and advised of lab results. Pt has no further questions.

## 2021-02-14 ENCOUNTER — Telehealth: Payer: Self-pay | Admitting: Family Medicine

## 2021-02-14 ENCOUNTER — Encounter: Payer: Self-pay | Admitting: Family Medicine

## 2021-02-14 ENCOUNTER — Telehealth (INDEPENDENT_AMBULATORY_CARE_PROVIDER_SITE_OTHER): Payer: Medicare Other | Admitting: Family Medicine

## 2021-02-14 ENCOUNTER — Other Ambulatory Visit: Payer: Self-pay

## 2021-02-14 VITALS — Ht 72.0 in | Wt 213.0 lb

## 2021-02-14 DIAGNOSIS — J329 Chronic sinusitis, unspecified: Secondary | ICD-10-CM | POA: Diagnosis not present

## 2021-02-14 MED ORDER — AMOXICILLIN-POT CLAVULANATE 875-125 MG PO TABS: 1.0000 | ORAL_TABLET | Freq: Two times a day (BID) | ORAL | 0 refills | Status: DC

## 2021-02-14 MED ORDER — AZELASTINE HCL 0.1 % NA SOLN: 2.0000 | Freq: Two times a day (BID) | NASAL | 12 refills | Status: DC

## 2021-02-14 NOTE — Telephone Encounter (Signed)
Please try and get more information from pt as we have not filled this for him before.  Who was filling it, how is he taking it, and for what reason?

## 2021-02-14 NOTE — Telephone Encounter (Signed)
Patient would like this refilled but I do not see any information or provider for this med. Please advise!

## 2021-02-14 NOTE — Progress Notes (Addendum)
   TRAYCE MAINO is a 75 y.o. male who presents today for a telephone visit.  Assessment/Plan:  New/Acute Problems: Sinusitis No red flags. Will start Astelin nasal spray. We will also send in pocket prescription for Augmentin with instruction to not start unless symptoms worsen or not improve in the next several days. He can continue using over-the-counter meds as needed. Encourage good oral hydration. Discussed reasons to return to care. Follow-up as needed.    Subjective:  HPI:  Patient with concerns for sinus infection. Symptoms started 4 days ago. He has had a lot of sinus congestion drainage and pressure. Pressure around his eyes as well. No sick contacts. No fevers or chills. Has tried Mucinex which helps. No other treatments tried.       Objective/Observations   NAD  Telephone Visit   I connected with Mavrik Bynum Urquilla on 02/14/21 at 11:00 AM EST via telephone and verified that I am speaking with the correct person using two identifiers. I discussed the limitations of evaluation and management by telemedicine and the availability of in person appointments. The patient expressed understanding and agreed to proceed.   Patient location: Home Provider location: [place] [facility] Persons participating in the virtual visit: Myself and Patient  Time Spent: 11 minutes of total time was spent on the date of the encounter performing the following actions: chart review prior to seeing the patient, obtaining history, performing a medically necessary exam, counseling on the treatment plan, placing orders, and documenting in our EHR.        Algis Greenhouse. Jerline Pain, MD 02/14/2021 11:05 AM

## 2021-02-14 NOTE — Telephone Encounter (Signed)
Patient would like his gabapentin refilled and sent to [facility] -

## 2021-02-14 NOTE — Telephone Encounter (Signed)
Spoke with patient and he stated that he was given gabapentin by Dr. Rita Ohara back in 85 for his back. He is currently having back problems and needed a refill. He stated that he is trying to get an appt with the current provider that took over Dr. Rita Ohara. He thought that his gabapentin had been transferred to you.

## 2021-02-15 MED ORDER — GABAPENTIN 300 MG PO CAPS: 300.0000 mg | ORAL_CAPSULE | Freq: Three times a day (TID) | ORAL | 1 refills | Status: DC

## 2021-02-15 NOTE — Telephone Encounter (Signed)
Prescription sent as pt requested.  I hope this helps!  If not, I agree w/ scheduling an appt with the back specialist

## 2021-02-15 NOTE — Telephone Encounter (Signed)
Spoke with patient and informed him that his Rx has been sent to pharmacy. Pt understood. No further concerns at this time.

## 2021-02-15 NOTE — Addendum Note (Signed)
Addended by: Vivi Barrack on: 02/15/2021 03:31 PM   Modules accepted: Level of Service

## 2021-02-18 NOTE — Progress Notes (Signed)
Subjective:   GANON DEMASI is a 75 y.o. male who presents for Medicare Annual/Subsequent preventive examination.  I connected with [place] today by telephone and verified that I am speaking with the correct person using two identifiers. Location patient: home Location provider: work Persons participating in the virtual visit: patient, Marine scientist.    I discussed the limitations, risks, security and privacy concerns of performing an evaluation and management service by telephone and the availability of in person appointments. I also discussed with the patient that there may be a patient responsible charge related to this service. The patient expressed understanding and verbally consented to this telephonic visit.    Interactive audio and video telecommunications were attempted between this provider and patient, however failed, due to patient having technical difficulties OR patient did not have access to video capability.  We continued and completed visit with audio only.  Some vital signs may be absent or patient reported.   Time Spent with patient on telephone encounter: 20 minutesle  Review of Systems     Cardiac Risk Factors include: advanced age (>5men, >59 women);sedentary lifestyle;family history of premature cardiovascular disease;hypertension     Objective:    Today's Vitals   02/21/21 0900  Weight: 213 lb (96.6 kg)  Height: 6' (1.829 m)   Body mass index is 28.89 kg/m.  Advanced Directives 02/21/2021 01/14/2020 07/25/2018 12/06/2017 12/03/2017 11/06/2017 10/26/2017  Does Patient Have a Medical Advance Directive? No No No No No No No  Type of Advance Directive - - - - - - -  Copy of Healthcare Power of Attorney in Chart? - - - - - - -  Would patient like information on creating a medical advance directive? No - Patient declined Yes (MAU/Ambulatory/Procedural Areas - Information given) Yes (MAU/Ambulatory/Procedural Areas - Information given) - - No - Patient declined No - Patient  declined  Pre-existing out of facility DNR order (yellow form or pink MOST form) - - - - - - -    Current Medications (verified) Outpatient Encounter Medications as of 02/21/2021  Medication Sig  . allopurinol (ZYLOPRIM) 100 MG tablet Take 1 tablet (100 mg total) by mouth daily.  Marland Kitchen azelastine (ASTELIN) 0.1 % nasal spray Place 2 sprays into both nostrils 2 (two) times daily.  . cetirizine (ZYRTEC) 10 MG tablet Take 1 tablet (10 mg total) by mouth daily.  . cyclobenzaprine (FLEXERIL) 10 MG tablet Take 1 tablet (10 mg total) by mouth at bedtime.  . diclofenac (VOLTAREN) 75 MG EC tablet TAKE  (1)  TABLET TWICE A DAY AS NEEDED.  Marland Kitchen EPINEPHrine 0.3 mg/0.3 mL IJ SOAJ injection INJECT 0.3 MLS (0.3 MG TOTAL) INTO THE MUSCLE ONCE FOR 1 DOSE AS NEEDED  . gabapentin (NEURONTIN) 300 MG capsule Take 1 capsule (300 mg total) by mouth 3 (three) times daily.  Marland Kitchen HYDROcodone-acetaminophen (NORCO/VICODIN) 5-325 MG tablet Take 1 tablet by mouth every 6 (six) hours as needed. for pain  . lisinopril-hydrochlorothiazide (ZESTORETIC) 10-12.5 MG tablet Take 1 tablet by mouth daily.  . metoprolol succinate (TOPROL-XL) 25 MG 24 hr tablet Take 1 tablet (25 mg total) by mouth daily.  . simvastatin (ZOCOR) 40 MG tablet Take 1 tablet (40 mg total) by mouth daily.  Marland Kitchen docusate sodium (COLACE) 100 MG capsule Take 1 capsule (100 mg total) by mouth 2 (two) times daily. To prevent constipation while taking pain medication. (Patient not taking: No sig reported)  . fluticasone (FLONASE) 50 MCG/ACT nasal spray Place 2 sprays into both nostrils daily. (Patient  not taking: No sig reported)  . [DISCONTINUED] amoxicillin-clavulanate (AUGMENTIN) 875-125 MG tablet Take 1 tablet by mouth 2 (two) times daily.   No facility-administered encounter medications on file as of 02/21/2021.    Allergies (verified) Bee venom   History: Past Medical History:  Diagnosis Date  . Cellulitis and abscess of other specified site   . Colon polyps   .  Gout   . Hypertension   . Neuromuscular disorder (HCC)    arthritis  . Scoliosis    Past Surgical History:  Procedure Laterality Date  . BACK SURGERY    . COLONOSCOPY  01/10/2013   Procedure: COLONOSCOPY;  Surgeon: Rogene Houston, MD;  Location: [facility];  Service: Endoscopy;  Laterality: N/A;  730  . COLONOSCOPY N/A 08/21/2013   Procedure: COLONOSCOPY;  Surgeon: Rogene Houston, MD;  Location: [facility];  Service: Endoscopy;  Laterality: N/A;  1030  . COLONOSCOPY N/A 08/26/2015   Procedure: COLONOSCOPY;  Surgeon: Rogene Houston, MD;  Location: [facility];  Service: Endoscopy;  Laterality: N/A;  240  . colonscopy    . PARTIAL KNEE ARTHROPLASTY Left 11/06/2017   Procedure: UNICOMPARTMENTAL KNEE;  Surgeon: [name], MD;  Location: [facility];  Service: Orthopedics;  Laterality: Left;   Family History  Problem Relation Age of Onset  . Kidney disease Sister   . Cancer Other        Family Hx of Cancer, CAD,Diabetes,Kidney Failure  . Colon cancer Neg Hx    Social History   Socioeconomic History  . Marital status: Married    Spouse name: Not on file  . Number of children: 2  . Years of education: Not on file  . Highest education level: Not on file  Occupational History  . 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 Smoking status: Former Smoker    Packs/day: 3.00    Years: 30.00    Pack years: 90.00    Types: Cigarettes    Start date: 12/18/1957    Quit date: 12/18/1996    Years since quitting: 24.1  . Smokeless tobacco: Never Used  . Tobacco comment: started smoking as a child  Vaping Use  . Vaping Use: Never used  Substance and Sexual Activity  . Alcohol use: Yes    Alcohol/week: 0.0 standard drinks    Comment: beer occasionally  . Drug use: No  . Sexual activity: Not on file  Other Topics Concern  . Not on file  Social History Narrative   2 sons; 4 Grandchildren       Enjoys fishing    Social Determinants of  Radio broadcast assistant Strain: Low Risk   . Difficulty of Paying Living Expenses: Not hard at all  Food Insecurity: No Food Insecurity  . Worried About Charity fundraiser in the Last Year: Never true  . Ran Out of Food in the Last Year: Never true  Transportation Needs: No Transportation Needs  . Lack of Transportation (Medical): No  . Lack of Transportation (Non-Medical): No  Physical Activity: Sufficiently Active  . Days of Exercise per Week: 5 days  . Minutes of Exercise per Session: 30 min  Stress: No Stress Concern Present  . Feeling of Stress : Not at all  Social Connections: Moderately Isolated  . Frequency of Communication with Friends and Family: More than three times a week  . Frequency of Social Gatherings with Friends and Family: More than  three times a week  . Attends Religious Services: Never  . Active Member of Clubs or Organizations: No  . Attends Archivist Meetings: Never  . Marital Status: Married    Tobacco Counseling Counseling given: Not Answered Comment: started smoking as a child   Clinical Intake:  Pre-visit preparation completed: Yes  Pain : No/denies pain     Nutritional Status: BMI 25 -29 Overweight Nutritional Risks: None Diabetes: No  How often do you need to have someone help you when you read instructions, pamphlets, or other written materials from your doctor or pharmacy?: 1 - Never  Diabetic?No  Interpreter Needed?: No  Information entered by :: Caroleen Hamman LPN   Activities of Daily Living In your present state of health, do you have any difficulty performing the following activities: 02/21/2021 01/17/2021  Hearing? N N  Vision? N N  Difficulty concentrating or making decisions? N N  Walking or climbing stairs? N N  Dressing or bathing? N N  Doing errands, shopping? N N  Preparing Food and eating ? N -  Using the Toilet? N -  In the past six months, have you accidently leaked urine? N -  Do you have  problems with loss of bowel control? N -  Managing your Medications? N -  Managing your Finances? N -  Housekeeping or managing your Housekeeping? N -  Some recent data might be hidden    Patient Care Team: Midge Minium, MD as PCP - General (Family Medicine) Jovita Gamma, MD as Consulting Physician (Neurosurgery) Herminio Commons, MD (Inactive) as Attending Physician (Cardiology) Rogene Houston, MD as Consulting Physician (Gastroenterology) [name], [facility] as Pharmacist (Pharmacist)  Indicate any recent Medical Services you may have received from other than [facility] providers in the past year (date may be approximate).     Assessment:   This is a routine wellness examination for Georg.  Hearing/Vision screen  Hearing Screening   125Hz  250Hz  500Hz  1000Hz  2000Hz  3000Hz  4000Hz  6000Hz  8000Hz   Right ear:           Left ear:           Comments: No issues  Vision Screening Comments: Wears glasses to drive Last eye exam-2 years ago-[facility] issues and exercise activities discussed: Current Exercise Habits: Home exercise routine, Type of exercise: walking, Time (Minutes): 30, Frequency (Times/Week): 7, Weekly Exercise (Minutes/Week): 210, Exercise limited by: None identified  Goals    . Patient Stated     Maintain current health    . PharmD Care Plan     CARE PLAN ENTRY  Current Barriers:  . Chronic Disease Management support, education, and care coordination needs related to Hypertension, Hyperlipidemia, and Gout   Hypertension . Pharmacist Clinical Goal(s): o Over the next 180 days, patient will work with PharmD and providers to maintain BP goal <130/80 . Current regimen:  o Lisinopril-hydrochlorothiazide 10-12.5 mg daily o Metoprolol succinate 25 mg daily  . Interventions: o Continue current management . Patient self care activities - Over the next 180 days, patient will: o Check BP at least once every 1-2 weeks, document, and provide  at future appointments o Ensure daily salt intake < 2300 mg/day  Hyperlipidemia . Pharmacist Clinical Goal(s): o Over the next 180 days, patient will work with PharmD and providers to maintain LDL goal < 70 . Current regimen:  o Simvastatin 40 mg daily  . Interventions: o Continue current management . Patient self care activities - Over  the next 180 days, patient will: o Continue current management  Gout . Pharmacist Clinical Goal(s) o Over the next 180 days, patient will work with PharmD and providers to minimize gout attacks . Current regimen:  o Allopurinol 100 mg daily  . Interventions: o Continue current medication management o Diet recommendations . Patient self care activities - Over the next 180 days, patient will: o Continue allopurinol  o Maintain healthy diet  Medication management . Pharmacist Clinical Goal(s): o Over the next 180 days, patient will work with PharmD and providers to maintain optimal medication adherence . Current pharmacy: [facility] . Interventions o Comprehensive medication review performed. o Continue current medication management strategy . Patient self care activities - Over the next 180 days, patient will: o Take medications as prescribed o Report any questions or concerns to PharmD and/or provider(s) Initial goal documentation.       Depression Screen PHQ 2/9 Scores 02/21/2021 01/17/2021 07/19/2020 01/14/2020 07/11/2019 12/30/2018 07/25/2018  PHQ - 2 Score 0 0 0 0 0 0 0  PHQ- 9 Score - 0 0 - 0 0 -    Fall Risk Fall Risk  02/21/2021 01/17/2021 07/19/2020 01/14/2020 07/11/2019  Falls in the past year? 0 0 0 0 0  Number falls in past yr: 0 0 0 0 0  Injury with Fall? 0 0 0 0 0  Risk for fall due to : - No Fall Risks - - -  Follow up Falls prevention discussed - Falls evaluation completed Falls evaluation completed;Education provided;Falls prevention discussed -    FALL RISK PREVENTION PERTAINING TO THE HOME:  Any stairs in or around the  home? Yes  If so, are there any without handrails? No  Home free of loose throw rugs in walkways, pet beds, electrical cords, etc? Yes  Adequate lighting in your home to reduce risk of falls? Yes   ASSISTIVE DEVICES UTILIZED TO PREVENT FALLS:  Life alert? No  Use of a cane, walker or w/c? No  Grab bars in the bathroom? Yes  Shower chair or bench in shower? No  Elevated toilet seat or a handicapped toilet? No   TIMED UP AND GO:  Was the test performed? No . phone visit   Cognitive Function:Normal cognitive status assessed by this Nurse Health Advisor. No abnormalities found.   MMSE - Mini Mental State Exam 07/25/2018  Orientation to time 5  Orientation to Place 5  Registration 3  Attention/ Calculation 3  Recall 1  Language- name 2 objects 2  Language- repeat 1  Language- follow 3 step command 3  Language- read & follow direction 1  Write a sentence 1  Copy design 1  Total score 26     6CIT Screen 01/14/2020  What Year? 0 points  What month? 0 points  What time? 0 points  Count back from 20 0 points  Months in reverse 0 points  Repeat phrase 0 points  Total Score 0    Immunizations Immunization History  Administered Date(s) Administered  . Moderna Sars-Covid-2 Vaccination 02/19/2020, 03/23/2020  . Pneumococcal Conjugate-13 07/18/2017  . Pneumococcal Polysaccharide-23 12/30/2018  . Tdap 07/30/2007  . Zoster 01/19/2015    TDAP status: Due, Education has been provided regarding the importance of this vaccine. Advised may receive this vaccine at local pharmacy or Health Dept. Aware to provide a copy of the vaccination record if obtained from local pharmacy or Health Dept. Verbalized acceptance and understanding.  Flu Vaccine status: Declined, Education has been provided regarding the importance  of this vaccine but patient still declined. Advised may receive this vaccine at local pharmacy or Health Dept. Aware to provide a copy of the vaccination record if obtained  from local pharmacy or Health Dept. Verbalized acceptance and understanding.  Pneumococcal vaccine status: Up to date  Covid-19 vaccine status: Information provided on how to obtain vaccines.  Booster due  Qualifies for Shingles Vaccine? Yes   Zostavax completed Yes   Shingrix Completed?: No.    Education has been provided regarding the importance of this vaccine. Patient has been advised to call insurance company to determine out of pocket expense if they have not yet received this vaccine. Advised may also receive vaccine at local pharmacy or Health Dept. Verbalized acceptance and understanding.  Screening Tests Health Maintenance  Topic Date Due  . INFLUENZA VACCINE  03/17/2021 (Originally 07/18/2020)  . TETANUS/TDAP  03/29/2021 (Originally 07/29/2017)  . COVID-19 Vaccine (3 - Booster for Moderna series) 03/29/2021 (Originally 09/22/2020)  . Hepatitis C Screening  07/19/2021 (Originally Nov 04, 1946)  . COLONOSCOPY (Pts 45-30yrs Insurance coverage will need to be confirmed)  08/25/2025  . PNA vac Low Risk Adult  Completed  . HPV VACCINES  Aged Out    Health Maintenance  There are no preventive care reminders to display for this patient.  Colorectal cancer screening: Type of screening: Colonoscopy. Completed 08/26/2015. Repeat every 5 years Patient states he has discussed with GI & is waiting for them to call him back to schedule.  Lung Cancer Screening: (Low Dose CT Chest recommended if Age 65-80 years, 30 pack-year currently smoking OR have quit w/in 15years.) does not qualify.     Additional Screening:  Hepatitis C Screening: does qualify; Discuss with PCP  Vision Screening: Recommended annual ophthalmology exams for early detection of glaucoma and other disorders of the eye. Is the patient up to date with their annual eye exam?  No  Who is the provider or what is the name of the office in which the patient attends annual eye exams? [facility] Patient to make an appt  soon  Dental Screening: Recommended annual dental exams for proper oral hygiene  Community Resource Referral / Chronic Care Management: CRR required this visit?  No   CCM required this visit?  No      Plan:     I have personally reviewed and noted the following in the patient's chart:   . Medical and social history . Use of alcohol, tobacco or illicit drugs  . Current medications and supplements . Functional ability and status . Nutritional status . Physical activity . Advanced directives . List of other physicians . Hospitalizations, surgeries, and ER visits in previous 12 months . Vitals . Screenings to include cognitive, depression, and falls . Referrals and appointments  In addition, I have reviewed and discussed with patient certain preventive protocols, quality metrics, and best practice recommendations. A written personalized care plan for preventive services as well as general preventive health recommendations were provided to patient.   Due to this being a telephonic visit, the after visit summary with patients personalized plan was offered to patient via mail or my-chart. Per request, patient was mailed a copy of AVS.  Marta Antu, LPN   05/24/3418  Nurse Health Advisor  Nurse Notes: None

## 2021-02-21 ENCOUNTER — Ambulatory Visit (INDEPENDENT_AMBULATORY_CARE_PROVIDER_SITE_OTHER): Payer: Medicare Other

## 2021-02-21 VITALS — Ht 72.0 in | Wt 213.0 lb

## 2021-02-21 DIAGNOSIS — M4156 Other secondary scoliosis, lumbar region: Secondary | ICD-10-CM | POA: Diagnosis not present

## 2021-02-21 DIAGNOSIS — Z0000 Encounter for general adult medical examination without abnormal findings: Secondary | ICD-10-CM

## 2021-02-21 NOTE — Patient Instructions (Signed)
[name] , Thank you for taking time to complete your Medicare Wellness Visit. I appreciate your ongoing commitment to your health goals. Please review the following plan we discussed and let me know if I can assist you in the future.   Screening recommendations/referrals: Colonoscopy: Completed 08/26/2015-Per our conversation today, you are waiting for GI to call you back to schedule. Recommended yearly ophthalmology/optometry visit for glaucoma screening and checkup Recommended yearly dental visit for hygiene and checkup  Vaccinations: Influenza vaccine: Declined Pneumococcal vaccine: Completed vaccines Tdap vaccine: Discuss with pharmacy. Shingles vaccine: Discuss with pharmacy  Covid-19: Due for Booster.  Advanced directives: Declined information today.  Conditions/risks identified: See problem list  Next appointment: Follow up in one year for your annual wellness visit.   Preventive Care 73 Years and Older, Male Preventive care refers to lifestyle choices and visits with your health care provider that can promote health and wellness. What does preventive care include?  A yearly physical exam. This is also called an annual well check.  Dental exams once or twice a year.  Routine eye exams. Ask your health care provider how often you should have your eyes checked.  Personal lifestyle choices, including:  Daily care of your teeth and gums.  Regular physical activity.  Eating a healthy diet.  Avoiding tobacco and drug use.  Limiting alcohol use.  Practicing safe sex.  Taking low doses of aspirin every day.  Taking vitamin and mineral supplements as recommended by your health care provider. What happens during an annual well check? The services and screenings done by your health care provider during your annual well check will depend on your age, overall health, lifestyle risk factors, and family history of disease. Counseling  Your health care provider may ask you  questions about your:  Alcohol use.  Tobacco use.  Drug use.  Emotional well-being.  Home and relationship well-being.  Sexual activity.  Eating habits.  History of falls.  Memory and ability to understand (cognition).  Work and work Statistician. Screening  You may have the following tests or measurements:  Height, weight, and BMI.  Blood pressure.  Lipid and cholesterol levels. These may be checked every 5 years, or more frequently if you are over 82 years old.  Skin check.  Lung cancer screening. You may have this screening every year starting at age 11 if you have a 30-pack-year history of smoking and currently smoke or have quit within the past 15 years.  Fecal occult blood test (FOBT) of the stool. You may have this test every year starting at age 2.  Flexible sigmoidoscopy or colonoscopy. You may have a sigmoidoscopy every 5 years or a colonoscopy every 10 years starting at age 58.  Prostate cancer screening. Recommendations will vary depending on your family history and other risks.  Hepatitis C blood test.  Hepatitis B blood test.  Sexually transmitted disease (STD) testing.  Diabetes screening. This is done by checking your blood sugar (glucose) after you have not eaten for a while (fasting). You may have this done every 1-3 years.  Abdominal aortic aneurysm (AAA) screening. You may need this if you are a current or former smoker.  Osteoporosis. You may be screened starting at age 64 if you are at high risk. Talk with your health care provider about your test results, treatment options, and if necessary, the need for more tests. Vaccines  Your health care provider may recommend certain vaccines, such as:  Influenza vaccine. This is recommended every year.  Tetanus, diphtheria, and acellular pertussis (Tdap, Td) vaccine. You may need a Td booster every 10 years.  Zoster vaccine. You may need this after age 66.  Pneumococcal 13-valent conjugate  (PCV13) vaccine. One dose is recommended after age 57.  Pneumococcal polysaccharide (PPSV23) vaccine. One dose is recommended after age 36. Talk to your health care provider about which screenings and vaccines you need and how often you need them. This information is not intended to replace advice given to you by your health care provider. Make sure you discuss any questions you have with your health care provider. Document Released: 12/31/2015 Document Revised: 08/23/2016 Document Reviewed: 10/05/2015 Elsevier Interactive Patient Education  2017 [place] Prevention in the Home Falls can cause injuries. They can happen to people of all ages. There are many things you can do to make your home safe and to help prevent falls. What can I do on the outside of my home?  Regularly fix the edges of walkways and driveways and fix any cracks.  Remove anything that might make you trip as you walk through a door, such as a raised step or threshold.  Trim any bushes or trees on the path to your home.  Use bright outdoor lighting.  Clear any walking paths of anything that might make someone trip, such as rocks or tools.  Regularly check to see if handrails are loose or broken. Make sure that both sides of any steps have handrails.  Any raised decks and porches should have guardrails on the edges.  Have any leaves, snow, or ice cleared regularly.  Use sand or salt on walking paths during winter.  Clean up any spills in your garage right away. This includes oil or grease spills. What can I do in the bathroom?  Use night lights.  Install grab bars by the toilet and in the tub and shower. Do not use towel bars as grab bars.  Use non-skid mats or decals in the tub or shower.  If you need to sit down in the shower, use a plastic, non-slip stool.  Keep the floor dry. Clean up any water that spills on the floor as soon as it happens.  Remove soap buildup in the tub or shower  regularly.  Attach bath mats securely with double-sided non-slip rug tape.  Do not have throw rugs and other things on the floor that can make you trip. What can I do in the bedroom?  Use night lights.  Make sure that you have a light by your bed that is easy to reach.  Do not use any sheets or blankets that are too big for your bed. They should not hang down onto the floor.  Have a firm chair that has side arms. You can use this for support while you get dressed.  Do not have throw rugs and other things on the floor that can make you trip. What can I do in the kitchen?  Clean up any spills right away.  Avoid walking on wet floors.  Keep items that you use a lot in easy-to-reach places.  If you need to reach something above you, use a strong step stool that has a grab bar.  Keep electrical cords out of the way.  Do not use floor polish or wax that makes floors slippery. If you must use wax, use non-skid floor wax.  Do not have throw rugs and other things on the floor that can make you trip. What can I do  with my stairs?  Do not leave any items on the stairs.  Make sure that there are handrails on both sides of the stairs and use them. Fix handrails that are broken or loose. Make sure that handrails are as long as the stairways.  Check any carpeting to make sure that it is firmly attached to the stairs. Fix any carpet that is loose or worn.  Avoid having throw rugs at the top or bottom of the stairs. If you do have throw rugs, attach them to the floor with carpet tape.  Make sure that you have a light switch at the top of the stairs and the bottom of the stairs. If you do not have them, ask someone to add them for you. What else can I do to help prevent falls?  Wear shoes that:  Do not have high heels.  Have rubber bottoms.  Are comfortable and fit you well.  Are closed at the toe. Do not wear sandals.  If you use a stepladder:  Make sure that it is fully  opened. Do not climb a closed stepladder.  Make sure that both sides of the stepladder are locked into place.  Ask someone to hold it for you, if possible.  Clearly mark and make sure that you can see:  Any grab bars or handrails.  First and last steps.  Where the edge of each step is.  Use tools that help you move around (mobility aids) if they are needed. These include:  Canes.  Walkers.  Scooters.  Crutches.  Turn on the lights when you go into a dark area. Replace any light bulbs as soon as they burn out.  Set up your furniture so you have a clear path. Avoid moving your furniture around.  If any of your floors are uneven, fix them.  If there are any pets around you, be aware of where they are.  Review your medicines with your doctor. Some medicines can make you feel dizzy. This can increase your chance of falling. Ask your doctor what other things that you can do to help prevent falls. This information is not intended to replace advice given to you by your health care provider. Make sure you discuss any questions you have with your health care provider. Document Released: 09/30/2009 Document Revised: 05/11/2016 Document Reviewed: 01/08/2015 Elsevier Interactive Patient Education  2017 [employer].

## 2021-03-07 ENCOUNTER — Other Ambulatory Visit: Payer: Self-pay | Admitting: Family Medicine

## 2021-03-08 ENCOUNTER — Telehealth (INDEPENDENT_AMBULATORY_CARE_PROVIDER_SITE_OTHER): Payer: Self-pay | Admitting: *Deleted

## 2021-03-08 ENCOUNTER — Other Ambulatory Visit (INDEPENDENT_AMBULATORY_CARE_PROVIDER_SITE_OTHER): Payer: Self-pay | Admitting: *Deleted

## 2021-03-08 DIAGNOSIS — Z86010 Personal history of colonic polyps: Secondary | ICD-10-CM

## 2021-03-08 MED ORDER — SUTAB 1479-225-188 MG PO TABS: 1.0000 | ORAL_TABLET | Freq: Once | ORAL | 0 refills | Status: AC

## 2021-03-08 NOTE — Telephone Encounter (Signed)
Patient needs sutab

## 2021-03-08 NOTE — Telephone Encounter (Signed)
Referring MD/PCP: tabori   Procedure: tcs w propofol  Reason/Indication:  Hx polyps  Has patient had this procedure before?  Yes, 2016  If so, when, by whom and where?    Is there a family history of colon cancer?  no  Who?  What age when diagnosed?    Is patient diabetic?   no      Does patient have prosthetic heart valve or mechanical valve?  no  Do you have a pacemaker/defibrillator?  no  Has patient ever had endocarditis/atrial fibrillation? no  Have you had a stroke/heart attack last 6 mths? no  Does patient use oxygen? no  Has patient had joint replacement within last 12 months?  no  Is patient constipated or do they take laxatives? no  Does patient have a history of alcohol/drug use?  no  Is patient on blood thinner such as Coumadin, Plavix and/or Aspirin? no  Do you take medicine for weight loss. no  Medications: allopurinol 100 mg daily, cyclobenzaprine 10 mg daily, diclofenac 75 mg bid, epinephrine, fluticasone daily, gabapentin 300 mg tid, hydrocodone 5/325 1 tab every 6 hours, lisinopril/hctz 10/12.5 mg daily, metoprolol 25 mg daily, simvastatin 40 mg daily  Allergies: bee venom  Medication Adjustment per Dr Rehman/Dr Jenetta Downer   Procedure date & time: 03/16/21

## 2021-03-09 ENCOUNTER — Encounter (INDEPENDENT_AMBULATORY_CARE_PROVIDER_SITE_OTHER): Payer: Self-pay | Admitting: *Deleted

## 2021-03-09 ENCOUNTER — Other Ambulatory Visit (INDEPENDENT_AMBULATORY_CARE_PROVIDER_SITE_OTHER): Payer: Self-pay

## 2021-03-09 DIAGNOSIS — Z86010 Personal history of colonic polyps: Secondary | ICD-10-CM

## 2021-03-10 NOTE — Patient Instructions (Signed)
[name]  03/10/2021     @PREFPERIOPPHARMACY @   Your procedure is scheduled on 03/16/2021   Report to [facility] at 1300  (1:00)  P.M.   Call this number if you have problems the morning of surgery:  [phone]   Remember:  Follow the diet and prep instructions given to you by the office.                        Take these medicines the morning of surgery with A SIP OF WATER  Allopurinol, zyrtec, voltaren, gabapentin, metoprolol.    Please brush your teeth.  Do not wear jewelry, make-up or nail polish.  Do not wear lotions, powders, or perfumes, or deodorant.  Do not shave 48 hours prior to surgery.  Men may shave face and neck.  Do not bring valuables to the hospital.  [facility] is not responsible for any belongings or valuables.   Contacts, dentures or bridgework may not be worn into surgery.  Leave your suitcase in the car.  After surgery it may be brought to your room.  For patients admitted to the hospital, discharge time will be determined by your treatment team.  Patients discharged the day of surgery will not be allowed to drive home and must have someone with them for 24 hours.   Special instructions:  DO NOT smoke tobacco or vape the morning of your procedure.   Please read over the following fact sheets that you were given. Anesthesia Post-op Instructions and Care and Recovery After Surgery       Colonoscopy, Adult, Care After This sheet gives you information about how to care for yourself after your procedure. Your health care provider may also give you more specific instructions. If you have problems or questions, contact your health care provider. What can I expect after the procedure? After the procedure, it is common to have:  A small amount of blood in your stool for 24 hours after the procedure.  Some gas.  Mild cramping or bloating of your abdomen. Follow these instructions at home: Eating and drinking  Drink enough fluid  to keep your urine pale yellow.  Follow instructions from your health care provider about eating or drinking restrictions.  Resume your normal diet as instructed by your health care provider. Avoid heavy or fried foods that are hard to digest.   Activity  Rest as told by your health care provider.  Avoid sitting for a long time without moving. Get up to take short walks every 1-2 hours. This is important to improve blood flow and breathing. Ask for help if you feel weak or unsteady.  Return to your normal activities as told by your health care provider. Ask your health care provider what activities are safe for you. Managing cramping and bloating  Try walking around when you have cramps or feel bloated.  Apply heat to your abdomen as told by your health care provider. Use the heat source that your health care provider recommends, such as a moist heat pack or a heating pad. ? Place a towel between your skin and the heat source. ? Leave the heat on for 20-30 minutes. ? Remove the heat if your skin turns bright red. This is especially important if you are unable to feel pain, heat, or cold. You may have a greater risk of getting burned.   General instructions  If you were given a sedative during  the procedure, it can affect you for several hours. Do not drive or operate machinery until your health care provider says that it is safe.  For the first 24 hours after the procedure: ? Do not sign important documents. ? Do not drink alcohol. ? Do your regular daily activities at a slower pace than normal. ? Eat soft foods that are easy to digest.  Take over-the-counter and prescription medicines only as told by your health care provider.  Keep all follow-up visits as told by your health care provider. This is important. Contact a health care provider if:  You have blood in your stool 2-3 days after the procedure. Get help right away if you have:  More than a small spotting of blood in  your stool.  Large blood clots in your stool.  Swelling of your abdomen.  Nausea or vomiting.  A fever.  Increasing pain in your abdomen that is not relieved with medicine. Summary  After the procedure, it is common to have a small amount of blood in your stool. You may also have mild cramping and bloating of your abdomen.  If you were given a sedative during the procedure, it can affect you for several hours. Do not drive or operate machinery until your health care provider says that it is safe.  Get help right away if you have a lot of blood in your stool, nausea or vomiting, a fever, or increased pain in your abdomen. This information is not intended to replace advice given to you by your health care provider. Make sure you discuss any questions you have with your health care provider. Document Revised: 11/28/2019 Document Reviewed: 06/30/2019 Elsevier Patient Education  2021 [place] After This sheet gives you information about how to care for yourself after your procedure. Your health care provider may also give you more specific instructions. If you have problems or questions, contact your health care provider. What can I expect after the procedure? After the procedure, it is common to have:  Tiredness.  Forgetfulness about what happened after the procedure.  Impaired judgment for important decisions.  Nausea or vomiting.  Some difficulty with balance. Follow these instructions at home: For the time period you were told by your health care provider:  Rest as needed.  Do not participate in activities where you could fall or become injured.  Do not drive or use machinery.  Do not drink alcohol.  Do not take sleeping pills or medicines that cause drowsiness.  Do not make important decisions or sign legal documents.  Do not take care of children on your own.      Eating and drinking  Follow the diet that is recommended by  your health care provider.  Drink enough fluid to keep your urine pale yellow.  If you vomit: ? Drink water, juice, or soup when you can drink without vomiting. ? Make sure you have little or no nausea before eating solid foods. General instructions  Have a responsible adult stay with you for the time you are told. It is important to have someone help care for you until you are awake and alert.  Take over-the-counter and prescription medicines only as told by your health care provider.  If you have sleep apnea, surgery and certain medicines can increase your risk for breathing problems. Follow instructions from your health care provider about wearing your sleep device: ? Anytime you are sleeping, including during daytime naps. ? While taking prescription pain  medicines, sleeping medicines, or medicines that make you drowsy.  Avoid smoking.  Keep all follow-up visits as told by your health care provider. This is important. Contact a health care provider if:  You keep feeling nauseous or you keep vomiting.  You feel light-headed.  You are still sleepy or having trouble with balance after 24 hours.  You develop a rash.  You have a fever.  You have redness or swelling around the IV site. Get help right away if:  You have trouble breathing.  You have new-onset confusion at home. Summary  For several hours after your procedure, you may feel tired. You may also be forgetful and have poor judgment.  Have a responsible adult stay with you for the time you are told. It is important to have someone help care for you until you are awake and alert.  Rest as told. Do not drive or operate machinery. Do not drink alcohol or take sleeping pills.  Get help right away if you have trouble breathing, or if you suddenly become confused. This information is not intended to replace advice given to you by your health care provider. Make sure you discuss any questions you have with your health  care provider. Document Revised: 08/19/2020 Document Reviewed: 11/06/2019 Elsevier Patient Education  2021 [employer].

## 2021-03-14 ENCOUNTER — Other Ambulatory Visit (HOSPITAL_COMMUNITY)
Admission: RE | Admit: 2021-03-14 | Discharge: 2021-03-14 | Disposition: A | Discharge status: Home / Self Care | Payer: Medicare Other | Source: Ambulatory Visit | Attending: Internal Medicine | Admitting: Internal Medicine

## 2021-03-14 ENCOUNTER — Encounter (HOSPITAL_COMMUNITY)
Admission: RE | Admit: 2021-03-14 | Discharge: 2021-03-14 | Disposition: A | Discharge status: Home / Self Care | Payer: Medicare Other | Source: Ambulatory Visit | Attending: Internal Medicine | Admitting: Internal Medicine

## 2021-03-14 ENCOUNTER — Other Ambulatory Visit: Payer: Self-pay

## 2021-03-14 ENCOUNTER — Encounter (HOSPITAL_COMMUNITY): Payer: Self-pay

## 2021-03-14 DIAGNOSIS — Z01818 Encounter for other preprocedural examination: Secondary | ICD-10-CM | POA: Insufficient documentation

## 2021-03-14 DIAGNOSIS — Z20822 Contact with and (suspected) exposure to covid-19: Secondary | ICD-10-CM | POA: Insufficient documentation

## 2021-03-14 DIAGNOSIS — Z86010 Personal history of colonic polyps: Secondary | ICD-10-CM

## 2021-03-14 HISTORY — DX: Unspecified osteoarthritis, unspecified site: M19.90

## 2021-03-15 LAB — SARS CORONAVIRUS 2 (TAT 6-24 HRS): SARS Coronavirus 2: NEGATIVE

## 2021-03-16 ENCOUNTER — Encounter (HOSPITAL_COMMUNITY): Payer: Self-pay | Admitting: Internal Medicine

## 2021-03-16 ENCOUNTER — Ambulatory Visit (HOSPITAL_COMMUNITY)
Admission: RE | Admit: 2021-03-16 | Discharge: 2021-03-16 | Disposition: A | Discharge status: Home / Self Care | Payer: Medicare Other | Attending: Internal Medicine | Admitting: Internal Medicine

## 2021-03-16 ENCOUNTER — Encounter (INDEPENDENT_AMBULATORY_CARE_PROVIDER_SITE_OTHER): Payer: Self-pay | Admitting: *Deleted

## 2021-03-16 ENCOUNTER — Encounter (HOSPITAL_COMMUNITY)
Admission: RE | Disposition: A | Discharge status: Home / Self Care | Payer: Self-pay | Source: Home / Self Care | Attending: Internal Medicine

## 2021-03-16 ENCOUNTER — Ambulatory Visit (HOSPITAL_COMMUNITY): Payer: Medicare Other | Admitting: Anesthesiology

## 2021-03-16 ENCOUNTER — Other Ambulatory Visit: Payer: Self-pay

## 2021-03-16 DIAGNOSIS — K5730 Diverticulosis of large intestine without perforation or abscess without bleeding: Secondary | ICD-10-CM | POA: Diagnosis not present

## 2021-03-16 DIAGNOSIS — Z8249 Family history of ischemic heart disease and other diseases of the circulatory system: Secondary | ICD-10-CM | POA: Diagnosis not present

## 2021-03-16 DIAGNOSIS — Z7982 Long term (current) use of aspirin: Secondary | ICD-10-CM | POA: Insufficient documentation

## 2021-03-16 DIAGNOSIS — Z91030 Bee allergy status: Secondary | ICD-10-CM | POA: Diagnosis not present

## 2021-03-16 DIAGNOSIS — Z1211 Encounter for screening for malignant neoplasm of colon: Secondary | ICD-10-CM | POA: Diagnosis not present

## 2021-03-16 DIAGNOSIS — I2510 Atherosclerotic heart disease of native coronary artery without angina pectoris: Secondary | ICD-10-CM | POA: Diagnosis not present

## 2021-03-16 DIAGNOSIS — Z09 Encounter for follow-up examination after completed treatment for conditions other than malignant neoplasm: Secondary | ICD-10-CM | POA: Diagnosis not present

## 2021-03-16 DIAGNOSIS — Z87891 Personal history of nicotine dependence: Secondary | ICD-10-CM | POA: Insufficient documentation

## 2021-03-16 DIAGNOSIS — Z833 Family history of diabetes mellitus: Secondary | ICD-10-CM | POA: Insufficient documentation

## 2021-03-16 DIAGNOSIS — Z79899 Other long term (current) drug therapy: Secondary | ICD-10-CM | POA: Diagnosis not present

## 2021-03-16 DIAGNOSIS — Z86010 Personal history of colonic polyps: Secondary | ICD-10-CM | POA: Diagnosis not present

## 2021-03-16 DIAGNOSIS — K644 Residual hemorrhoidal skin tags: Secondary | ICD-10-CM | POA: Diagnosis not present

## 2021-03-16 DIAGNOSIS — Z96652 Presence of left artificial knee joint: Secondary | ICD-10-CM | POA: Insufficient documentation

## 2021-03-16 HISTORY — PX: COLONOSCOPY WITH PROPOFOL: SHX5780

## 2021-03-16 LAB — HM COLONOSCOPY

## 2021-03-16 SURGERY — COLONOSCOPY WITH PROPOFOL
Anesthesia: General

## 2021-03-16 MED ORDER — PROPOFOL 500 MG/50ML IV EMUL
INTRAVENOUS | Status: DC | PRN
  Administered 2021-03-16: 150 ug/kg/min via INTRAVENOUS

## 2021-03-16 MED ORDER — STERILE WATER FOR IRRIGATION IR SOLN
Status: DC | PRN
  Administered 2021-03-16: 200 mL

## 2021-03-16 MED ORDER — LIDOCAINE HCL (CARDIAC) PF 100 MG/5ML IV SOSY
PREFILLED_SYRINGE | INTRAVENOUS | Status: DC | PRN
  Administered 2021-03-16: 50 mg via INTRAVENOUS

## 2021-03-16 MED ORDER — CHLORHEXIDINE GLUCONATE CLOTH 2 % EX PADS: 6.0000 | MEDICATED_PAD | Freq: Once | CUTANEOUS | Status: DC

## 2021-03-16 MED ORDER — PROPOFOL 10 MG/ML IV BOLUS
INTRAVENOUS | Status: DC | PRN
  Administered 2021-03-16: 100 mg via INTRAVENOUS

## 2021-03-16 MED ORDER — LACTATED RINGERS IV SOLN: INTRAVENOUS | Status: DC

## 2021-03-16 MED ORDER — SIMETHICONE 40 MG/0.6ML PO SUSP
ORAL | Status: AC
  Filled 2021-03-16: qty 0.6

## 2021-03-16 NOTE — Op Note (Signed)
[facility] Patient Name: [name] Procedure Date: 03/16/2021 7:16 AM MRN: [id number] Date of Birth: 1946/04/14 Attending MD: Hildred Laser , MD CSN: [id number] Age: 75 Admit Type: Outpatient Procedure:                Colonoscopy Indications:              High risk colon cancer surveillance: Personal                            history of colonic polyps Providers:                Hildred Laser, MD, Lurline Del, RN, Casimer Bilis, Technician Referring MD:             Aundra Millet. Birdie Riddle, MD Medicines:                Propofol per Anesthesia Complications:            No immediate complications. Estimated Blood Loss:     Estimated blood loss: none. Procedure:                Pre-Anesthesia Assessment:                           - Prior to the procedure, a History and Physical                            was performed, and patient medications and                            allergies were reviewed. The patient's tolerance of                            previous anesthesia was also reviewed. The risks                            and benefits of the procedure and the sedation                            options and risks were discussed with the patient.                            All questions were answered, and informed consent                            was obtained. Prior Anticoagulants: The patient has                            taken no previous anticoagulant or antiplatelet                            agents except for aspirin. ASA Grade Assessment: II                            -  A patient with mild systemic disease. After                            reviewing the risks and benefits, the patient was                            deemed in satisfactory condition to undergo the                            procedure.                           After obtaining informed consent, the colonoscope                            was passed under direct vision.  Throughout the                            procedure, the patient's blood pressure, pulse, and                            oxygen saturations were monitored continuously. The                            [id number]) was introduced through the anus                            and advanced to the the cecum, identified by                            appendiceal orifice and ileocecal valve. The                            colonoscopy was performed without difficulty. The                            patient tolerated the procedure well. The quality                            of the bowel preparation was excellent. The                            ileocecal valve, appendiceal orifice, and rectum                            were photographed. Scope In: 7:34:21 AM Scope Out: 7:56:43 AM Scope Withdrawal Time: 0 hours 10 minutes 27 seconds  Total Procedure Duration: 0 hours 12 minutes 35 seconds  Findings:      The perianal and digital rectal examinations were normal.      Scattered diverticula were found in the sigmoid colon.      The exam was otherwise normal throughout the examined colon.      External hemorrhoids were found during retroflexion. The hemorrhoids       were small. Impression:               -  Diverticulosis in the sigmoid colon.                           - External hemorrhoids.                           - No specimens collected. Moderate Sedation:      Per Anesthesia Care Recommendation:           - Patient has a contact number available for                            emergencies. The signs and symptoms of potential                            delayed complications were discussed with the                            patient. Return to normal activities tomorrow.                            Written discharge instructions were provided to the                            patient.                           - High fiber diet today.                           - Continue present  medications.                           - Repeat colonoscopy in 5 years for surveillance. Procedure Code(s):        --- Professional ---                           [phone], Colonoscopy, flexible; diagnostic, including                            collection of specimen(s) by brushing or washing,                            when performed (separate procedure) Diagnosis Code(s):        --- Professional ---                           Z86.010, Personal history of colonic polyps                           K64.4, Residual hemorrhoidal skin tags                           K57.30, Diverticulosis of large intestine without                            perforation or abscess without bleeding CPT copyright  2019 American [employer]. All rights reserved. The codes documented in this report are preliminary and upon coder review may  be revised to meet current compliance requirements. Hildred Laser, MD Hildred Laser, MD 03/16/2021 7:55:22 AM This report has been signed electronically. Number of Addenda: 0

## 2021-03-16 NOTE — Discharge Instructions (Signed)
Resume usual medications including aspirin as before. High-fiber diet. No driving for 24 hours. Next colonoscopy in 5 years.    Colonoscopy, Adult, Care After This sheet gives you information about how to care for yourself after your procedure. Your doctor may also give you more specific instructions. If you have problems or questions, call your doctor. What can I expect after the procedure? After the procedure, it is common to have:  A small amount of blood in your poop (stool) for 24 hours.  Some gas.  Mild cramping or bloating in your belly (abdomen). Follow these instructions at home: Eating and drinking  Drink enough fluid to keep your pee (urine) pale yellow.  Follow instructions from your doctor about what you cannot eat or drink.  Return to your normal diet as told by your doctor. Avoid heavy or fried foods that are hard to digest.   Activity  Rest as told by your doctor.  Do not sit for a long time without moving. Get up to take short walks every 1-2 hours. This is important. Ask for help if you feel weak or unsteady.  Return to your normal activities as told by your doctor. Ask your doctor what activities are safe for you. To help cramping and bloating:  Try walking around.  Put heat on your belly as told by your doctor. Use the heat source that your doctor recommends, such as a moist heat pack or a heating pad. ? Put a towel between your skin and the heat source. ? Leave the heat on for 20-30 minutes. ? Remove the heat if your skin turns bright red. This is very important if you are unable to feel pain, heat, or cold. You may have a greater risk of getting burned.   General instructions  If you were given a medicine to help you relax (sedative) during your procedure, it can affect you for many hours. Do not drive or use machinery until your doctor says that it is safe.  For the first 24 hours after the procedure: ? Do not sign important documents. ? Do not  drink alcohol. ? Do your daily activities more slowly than normal. ? Eat foods that are soft and easy to digest.  Take over-the-counter or prescription medicines only as told by your doctor.  Keep all follow-up visits as told by your doctor. This is important. Contact a doctor if:  You have blood in your poop 2-3 days after the procedure. Get help right away if:  You have more than a small amount of blood in your poop.  You see large clumps of tissue (blood clots) in your poop.  Your belly is swollen.  You feel like you may vomit (nauseous).  You vomit.  You have a fever.  You have belly pain that gets worse, and medicine does not help your pain. Summary  After the procedure, it is common to have a small amount of blood in your poop. You may also have mild cramping and bloating in your belly.  If you were given a medicine to help you relax (sedative) during your procedure, it can affect you for many hours. Do not drive or use machinery until your doctor says that it is safe.  Get help right away if you have a lot of blood in your poop, feel like you may vomit, have a fever, or have more belly pain. This information is not intended to replace advice given to you by your health care provider. Make sure  you discuss any questions you have with your health care provider. Document Revised: 10/10/2019 Document Reviewed: 06/30/2019 Elsevier Patient Education  2021 [place] This sheet gives you information about how to care for yourself after your procedure. Your health care provider may also give you more specific instructions. If you have problems or questions, contact your health care provider. What can I expect after the procedure? After the procedure, it is common to have:  Tiredness.  Forgetfulness about what happened after the procedure.  Impaired judgment for important decisions.  Nausea or vomiting.  Some difficulty with  balance. Follow these instructions at home: For the time period you were told by your health care provider:  Rest as needed.  Do not participate in activities where you could fall or become injured.  Do not drive or use machinery.  Do not drink alcohol.  Do not take sleeping pills or medicines that cause drowsiness.  Do not make important decisions or sign legal documents.  Do not take care of children on your own.      Eating and drinking  Follow the diet that is recommended by your health care provider.  Drink enough fluid to keep your urine pale yellow.  If you vomit: ? Drink water, juice, or soup when you can drink without vomiting. ? Make sure you have little or no nausea before eating solid foods. General instructions  Have a responsible adult stay with you for the time you are told. It is important to have someone help care for you until you are awake and alert.  Take over-the-counter and prescription medicines only as told by your health care provider.  If you have sleep apnea, surgery and certain medicines can increase your risk for breathing problems. Follow instructions from your health care provider about wearing your sleep device: ? Anytime you are sleeping, including during daytime naps. ? While taking prescription pain medicines, sleeping medicines, or medicines that make you drowsy.  Avoid smoking.  Keep all follow-up visits as told by your health care provider. This is important. Contact a health care provider if:  You keep feeling nauseous or you keep vomiting.  You feel light-headed.  You are still sleepy or having trouble with balance after 24 hours.  You develop a rash.  You have a fever.  You have redness or swelling around the IV site. Get help right away if:  You have trouble breathing.  You have new-onset confusion at home. Summary  For several hours after your procedure, you may feel tired. You may also be forgetful and have poor  judgment.  Have a responsible adult stay with you for the time you are told. It is important to have someone help care for you until you are awake and alert.  Rest as told. Do not drive or operate machinery. Do not drink alcohol or take sleeping pills.  Get help right away if you have trouble breathing, or if you suddenly become confused. This information is not intended to replace advice given to you by your health care provider. Make sure you discuss any questions you have with your health care provider. Document Revised: 08/19/2020 Document Reviewed: 11/06/2019 Elsevier Patient Education  2021 [place].   High-Fiber Eating Plan Fiber, also called dietary fiber, is a type of carbohydrate. It is found foods such as fruits, vegetables, whole grains, and beans. A high-fiber diet can have many health benefits. Your health care provider may recommend a high-fiber  diet to help:  Prevent constipation. Fiber can make your bowel movements more regular.  Lower your cholesterol.  Relieve the following conditions: ? Inflammation of veins in the anus (hemorrhoids). ? Inflammation of specific areas of the digestive tract (uncomplicated diverticulosis). ? A problem of the large intestine, also called the colon, that sometimes causes pain and diarrhea (irritable bowel syndrome, or IBS).  Prevent overeating as part of a weight-loss plan.  Prevent heart disease, type 2 diabetes, and certain cancers. What are tips for following this plan? Reading food labels  Check the nutrition facts label on food products for the amount of dietary fiber. Choose foods that have 5 grams of fiber or more per serving.  The goals for recommended daily fiber intake include: ? Men (age 62 or younger): 34-38 g. ? Men (over age 45): 28-34 g. ? Women (age 92 or younger): 25-28 g. ? Women (over age 15): 22-25 g. Your daily fiber goal is _____________ g.   Shopping  Choose whole fruits and vegetables instead of  processed forms, such as apple juice or applesauce.  Choose a wide variety of high-fiber foods such as avocados, lentils, oats, and kidney beans.  Read the nutrition facts label of the foods you choose. Be aware of foods with added fiber. These foods often have high sugar and sodium amounts per serving. Cooking  Use whole-grain flour for baking and cooking.  Cook with brown rice instead of white rice. Meal planning  Start the day with a breakfast that is high in fiber, such as a cereal that contains 5 g of fiber or more per serving.  Eat breads and cereals that are made with whole-grain flour instead of refined flour or white flour.  Eat brown rice, bulgur wheat, or millet instead of white rice.  Use beans in place of meat in soups, salads, and pasta dishes.  Be sure that half of the grains you eat each day are whole grains. General information  You can get the recommended daily intake of dietary fiber by: ? Eating a variety of fruits, vegetables, grains, nuts, and beans. ? Taking a fiber supplement if you are not able to take in enough fiber in your diet. It is better to get fiber through food than from a supplement.  Gradually increase how much fiber you consume. If you increase your intake of dietary fiber too quickly, you may have bloating, cramping, or gas.  Drink plenty of water to help you digest fiber.  Choose high-fiber snacks, such as berries, raw vegetables, nuts, and popcorn. What foods should I eat? Fruits Berries. Pears. Apples. Oranges. Avocado. Prunes and raisins. Dried figs. Vegetables Sweet potatoes. Spinach. Kale. Artichokes. Cabbage. Broccoli. Cauliflower. Green peas. Carrots. Squash. Grains Whole-grain breads. Multigrain cereal. Oats and oatmeal. Brown rice. Barley. Bulgur wheat. [place]. Quinoa. Bran muffins. Popcorn. Rye wafer crackers. Meats and other proteins Navy beans, kidney beans, and pinto beans. Soybeans. Split peas. Lentils. Nuts and  seeds. Dairy Fiber-fortified yogurt. Beverages Fiber-fortified soy milk. Fiber-fortified orange juice. Other foods Fiber bars. The items listed above may not be a complete list of recommended foods and beverages. Contact a dietitian for more information. What foods should I avoid? Fruits Fruit juice. Cooked, strained fruit. Vegetables Fried potatoes. Canned vegetables. Well-cooked vegetables. Grains White bread. Pasta made with refined flour. White rice. Meats and other proteins Fatty cuts of meat. Fried chicken or fried fish. Dairy Milk. Yogurt. Cream cheese. Sour cream. Fats and oils Butters. Beverages Soft drinks. Other foods Cakes and  pastries. The items listed above may not be a complete list of foods and beverages to avoid. Talk with your dietitian about what choices are best for you. Summary  Fiber is a type of carbohydrate. It is found in foods such as fruits, vegetables, whole grains, and beans.  A high-fiber diet has many benefits. It can help to prevent constipation, lower blood cholesterol, aid weight loss, and reduce your risk of heart disease, diabetes, and certain cancers.  Increase your intake of fiber gradually. Increasing fiber too quickly may cause cramping, bloating, and gas. Drink plenty of water while you increase the amount of fiber you consume.  The best sources of fiber include whole fruits and vegetables, whole grains, nuts, seeds, and beans. This information is not intended to replace advice given to you by your health care provider. Make sure you discuss any questions you have with your health care provider. Document Revised: 04/08/2020 Document Reviewed: 04/08/2020 Elsevier Patient Education  2021 [place].   Hemorrhoids Hemorrhoids are swollen veins that may develop:  In the butt (rectum). These are called internal hemorrhoids.  Around the opening of the butt (anus). These are called external hemorrhoids. Hemorrhoids can cause pain,  itching, or bleeding. Most of the time, they do not cause serious problems. They usually get better with diet changes, lifestyle changes, and other home treatments. What are the causes? This condition may be caused by:  Having trouble pooping (constipation).  Pushing hard (straining) to poop.  Watery poop (diarrhea).  Pregnancy.  Being very overweight (obese).  Sitting for long periods of time.  Heavy lifting or other activity that causes you to strain.  Anal sex.  Riding a bike for a long period of time. What are the signs or symptoms? Symptoms of this condition include:  Pain.  Itching or soreness in the butt.  Bleeding from the butt.  Leaking poop.  Swelling in the area.  One or more lumps around the opening of your butt. How is this diagnosed? A doctor can often diagnose this condition by looking at the affected area. The doctor may also:  Do an exam that involves feeling the area with a gloved hand (digital rectal exam).  Examine the area inside your butt using a small tube (anoscope).  Order blood tests. This may be done if you have lost a lot of blood.  Have you get a test that involves looking inside the colon using a flexible tube with a camera on the end (sigmoidoscopy or colonoscopy). How is this treated? This condition can usually be treated at home. Your doctor may tell you to change what you eat, make lifestyle changes, or try home treatments. If these do not help, procedures can be done to remove the hemorrhoids or make them smaller. These may involve:  Placing rubber bands at the base of the hemorrhoids to cut off their blood supply.  Injecting medicine into the hemorrhoids to shrink them.  Shining a type of light energy onto the hemorrhoids to cause them to fall off.  Doing surgery to remove the hemorrhoids or cut off their blood supply. Follow these instructions at home: Eating and drinking  Eat foods that have a lot of fiber in them. These  include whole grains, beans, nuts, fruits, and vegetables.  Ask your doctor about taking products that have added fiber (fibersupplements).  Reduce the amount of fat in your diet. You can do this by: ? Eating low-fat dairy products. ? Eating less red meat. ? Avoiding processed  foods.  Drink enough fluid to keep your pee (urine) pale yellow.   Managing pain and swelling  Take a warm-water bath (sitz bath) for 20 minutes to ease pain. Do this 3-4 times a day. You may do this in a bathtub or using a portable sitz bath that fits over the toilet.  If told, put ice on the painful area. It may be helpful to use ice between your warm baths. ? Put ice in a plastic bag. ? Place a towel between your skin and the bag. ? Leave the ice on for 20 minutes, 2-3 times a day.   General instructions  Take over-the-counter and prescription medicines only as told by your doctor. ? Medicated creams and medicines may be used as told.  Exercise often. Ask your doctor how much and what kind of exercise is best for you.  Go to the bathroom when you have the urge to poop. Do not wait.  Avoid pushing too hard when you poop.  Keep your butt dry and clean. Use wet toilet paper or moist towelettes after pooping.  Do not sit on the toilet for a long time.  Keep all follow-up visits as told by your doctor. This is important. Contact a doctor if you:  Have pain and swelling that do not get better with treatment or medicine.  Have trouble pooping.  Cannot poop.  Have pain or swelling outside the area of the hemorrhoids. Get help right away if you have:  Bleeding that will not stop. Summary  Hemorrhoids are swollen veins in the butt or around the opening of the butt.  They can cause pain, itching, or bleeding.  Eat foods that have a lot of fiber in them. These include whole grains, beans, nuts, fruits, and vegetables.  Take a warm-water bath (sitz bath) for 20 minutes to ease pain. Do this 3-4 times  a day. This information is not intended to replace advice given to you by your health care provider. Make sure you discuss any questions you have with your health care provider. Document Revised: 12/12/2018 Document Reviewed: 04/25/2018 Elsevier Patient Education  [place].

## 2021-03-16 NOTE — Anesthesia Procedure Notes (Signed)
Date/Time: 03/16/2021 7:35 AM Performed by: Orlie Dakin, CRNA Pre-anesthesia Checklist: Patient identified, Emergency Drugs available, Suction available and Patient being monitored Patient Re-evaluated:Patient Re-evaluated prior to induction Oxygen Delivery Method: Nasal cannula Induction Type: IV induction Placement Confirmation: positive ETCO2

## 2021-03-16 NOTE — Anesthesia Preprocedure Evaluation (Signed)
Anesthesia Evaluation  Patient identified by MRN, date of birth, ID band Patient awake    Reviewed: Allergy & Precautions, NPO status , Patient's Chart, lab work & pertinent test results, reviewed documented beta blocker date and time   History of Anesthesia Complications Negative for: history of anesthetic complications  Airway Mallampati: II  TM Distance: >3 FB Neck ROM: Full    Dental  (+) Dental Advisory Given, Upper Dentures, Lower Dentures, Chipped   Pulmonary neg pulmonary ROS, former smoker,    Pulmonary exam normal breath sounds clear to auscultation       Cardiovascular Exercise Tolerance: Good hypertension, Pt. on medications and Pt. on home beta blockers + CAD  Normal cardiovascular exam Rhythm:Regular Rate:Normal     Neuro/Psych  Neuromuscular disease negative psych ROS   GI/Hepatic negative GI ROS, Neg liver ROS,   Endo/Other  negative endocrine ROS  Renal/GU negative Renal ROS  negative genitourinary   Musculoskeletal  (+) Arthritis  (gout),   Abdominal   Peds  Hematology negative hematology ROS (+)   Anesthesia Other Findings   Reproductive/Obstetrics negative OB ROS                            Anesthesia Physical Anesthesia Plan  ASA: II  Anesthesia Plan: General   Post-op Pain Management:    Induction: Intravenous  PONV Risk Score and Plan: Propofol infusion  Airway Management Planned: Nasal Cannula and Natural Airway  Additional Equipment:   Intra-op Plan:   Post-operative Plan:   Informed Consent: I have reviewed the patients History and Physical, chart, labs and discussed the procedure including the risks, benefits and alternatives for the proposed anesthesia with the patient or authorized representative who has indicated his/her understanding and acceptance.     Dental advisory given  Plan Discussed with: CRNA and Surgeon  Anesthesia Plan  Comments:         Anesthesia Quick Evaluation

## 2021-03-16 NOTE — H&P (Signed)
[name] is an 75 y.o. male.   Chief Complaint: Patient is here for colonoscopy. HPI: Patient is 75 year old Caucasian male with history of multiple polyps who is here for surveillance colonoscopy.  His first exam was in January 2014 with removal of 2 large polyps along with multiple small polyps.  Second colonoscopy was in September 2014 and his last exam was in September 2016 when he only had 4 small tubular adenomas. He feels fine.  He denies abdominal pain change in bowel habits or rectal bleeding. Family history is negative for CRC. He he may take couple of doses of diclofenac per month.  Low-dose aspirin is on hold.  Past Medical History:  Diagnosis Date  . Arthritis   . Cellulitis and abscess of other specified site   . Colon polyps   . Gout   . Hypertension   . Neuromuscular disorder (HCC)    arthritis  . Scoliosis     Past Surgical History:  Procedure Laterality Date  . BACK SURGERY    . COLONOSCOPY  01/10/2013   Procedure: COLONOSCOPY;  Surgeon: Rogene Houston, MD;  [facility];  Service: Endoscopy;  Laterality: N/A;  730  . COLONOSCOPY N/A 08/21/2013   Procedure: COLONOSCOPY;  Surgeon: Rogene Houston, MD;  [facility];  Service: Endoscopy;  Laterality: N/A;  1030  . COLONOSCOPY N/A 08/26/2015   Procedure: COLONOSCOPY;  Surgeon: Rogene Houston, MD;  [facility];  Service: Endoscopy;  Laterality: N/A;  240  . colonscopy    . PARTIAL KNEE ARTHROPLASTY Left 11/06/2017   Procedure: UNICOMPARTMENTAL KNEE;  Surgeon: [name], MD;  Location: [facility];  Service: Orthopedics;  Laterality: Left;    Family History  Problem Relation Age of Onset  . Kidney disease Sister   . Cancer Other        Family Hx of Cancer, CAD,Diabetes,Kidney Failure  . Colon cancer Neg Hx    Social History:  reports that he quit smoking about 24 years ago. His smoking use included cigarettes. He started smoking about 63 years ago. He has a 90.00 pack-year  smoking history. He has never used smokeless tobacco. He reports current alcohol use. He reports that he does not use drugs.  Allergies:  Allergies  Allergen Reactions  . Bee Venom Anaphylaxis    Medications Prior to Admission  Medication Sig Dispense Refill  . allopurinol (ZYLOPRIM) 100 MG tablet Take 1 tablet (100 mg total) by mouth daily. 90 tablet 1  . aspirin EC 81 MG tablet Take 81 mg by mouth daily. Swallow whole.    . cetirizine (ZYRTEC) 10 MG tablet Take 1 tablet (10 mg total) by mouth daily. 90 tablet 3  . diclofenac (VOLTAREN) 75 MG EC tablet TAKE  (1)  TABLET TWICE A DAY AS NEEDED. (Patient taking differently: Take 75 mg by mouth in the morning.) 60 tablet 0  . EPINEPHrine 0.3 mg/0.3 mL IJ SOAJ injection Inject 0.3 mg into the muscle as needed for anaphylaxis.  0  . gabapentin (NEURONTIN) 300 MG capsule Take 1 capsule (300 mg total) by mouth 3 (three) times daily. (Patient taking differently: Take 300 mg by mouth 2 (two) times daily as needed (pain).) 90 capsule 1  . HYDROcodone-acetaminophen (NORCO/VICODIN) 5-325 MG tablet Take 1 tablet by mouth every 6 (six) hours as needed for moderate pain.  0  . lisinopril-hydrochlorothiazide (ZESTORETIC) 10-12.5 MG tablet Take 1 tablet by mouth daily. 90 tablet 1  . metoprolol succinate (  TOPROL-XL) 25 MG 24 hr tablet Take 1 tablet (25 mg total) by mouth daily. 90 tablet 1  . simvastatin (ZOCOR) 40 MG tablet Take 1 tablet (40 mg total) by mouth daily. 90 tablet 1  . azelastine (ASTELIN) 0.1 % nasal spray Place 2 sprays into both nostrils 2 (two) times daily. (Patient not taking: No sig reported) 30 mL 12  . cyclobenzaprine (FLEXERIL) 10 MG tablet Take 1 tablet (10 mg total) by mouth at bedtime. (Patient not taking: No sig reported) 10 tablet 0  . docusate sodium (COLACE) 100 MG capsule Take 1 capsule (100 mg total) by mouth 2 (two) times daily. To prevent constipation while taking pain medication. 60 capsule 0  . fluticasone (FLONASE) 50  MCG/ACT nasal spray Place 2 sprays into both nostrils daily. 16 g 6    Results for orders placed or performed during the hospital encounter of 03/14/21 (from the past 48 hour(s))  SARS CORONAVIRUS 2 (TAT 6-24 HRS) Nasopharyngeal Nasopharyngeal Swab     Status: None   Collection Time: 03/14/21  8:24 AM   Specimen: Nasopharyngeal Swab  Result Value Ref Range   SARS Coronavirus 2 NEGATIVE NEGATIVE    Comment: (NOTE) SARS-CoV-2 target nucleic acids are NOT DETECTED.  The SARS-CoV-2 RNA is generally detectable in upper and lower respiratory specimens during the acute phase of infection. Negative results do not preclude SARS-CoV-2 infection, do not rule out co-infections with other pathogens, and should not be used as the sole basis for treatment or other patient management decisions. Negative results must be combined with clinical observations, patient history, and epidemiological information. The expected result is Negative.  Fact Sheet for Patients: SugarRoll.be  Fact Sheet for Healthcare Providers: https://www.woods-mathews.com/  This test is not yet approved or cleared by the [place] FDA and  has been authorized for detection and/or diagnosis of SARS-CoV-2 by FDA under an Emergency Use Authorization (EUA). This EUA will remain  in effect (meaning this test can be used) for the duration of the COVID-19 declaration under Se ction 564(b)(1) of the Act, 21 U.S.C. section 360bbb-3(b)(1), unless the authorization is terminated or revoked sooner.  Performed at [facility], [place] [address], [place], [place] [address]    No results found.  Review of Systems  Blood pressure 117/73, pulse 71, temperature 98.1 F (36.7 C), temperature source Oral, resp. rate (!) 21, SpO2 100 %. Physical Exam HENT:     Mouth/Throat:     Mouth: Mucous membranes are moist.     Pharynx: Oropharynx is clear.  Eyes:     General:        Left eye: No  discharge.     Conjunctiva/sclera: Conjunctivae normal.  Cardiovascular:     Rate and Rhythm: Normal rate and regular rhythm.     Heart sounds: Normal heart sounds. No murmur heard.   Pulmonary:     Effort: Pulmonary effort is normal.     Breath sounds: Normal breath sounds.  Abdominal:     Comments: Abdomen is full but soft and nontender with organomegaly or masses.  Musculoskeletal:        General: No swelling.     Cervical back: Neck supple.  Lymphadenopathy:     Cervical: No cervical adenopathy.  Skin:    General: Skin is warm and dry.  Neurological:     Mental Status: He is alert.      Assessment/Plan  History of colonic polyps Surveillance colonoscopy.  Hildred Laser, MD 03/16/2021, 7:23 AM

## 2021-03-16 NOTE — Transfer of Care (Signed)
Immediate Anesthesia Transfer of Care Note  Patient: [name]  Procedure(s) Performed: COLONOSCOPY WITH PROPOFOL (N/A )  Patient Location: Short Stay  Anesthesia Type:General  Level of Consciousness: awake, alert  and oriented  Airway & Oxygen Therapy: Patient Spontanous Breathing  Post-op Assessment: Report given to RN, Post -op Vital signs reviewed and stable and Patient moving all extremities X 4  Post vital signs: Reviewed and stable  Last Vitals:  Vitals Value Taken Time  BP 121/76 03/16/21 0753  Temp 36.7 C 03/16/21 0753  Pulse 66 03/16/21 0753  Resp    SpO2 96 % 03/16/21 0753    Last Pain:  Vitals:   03/16/21 0753  TempSrc: Axillary  PainSc: 0-No pain         Complications: No complications documented.

## 2021-03-16 NOTE — Anesthesia Postprocedure Evaluation (Signed)
Anesthesia Post Note  Patient: [name]  Procedure(s) Performed: COLONOSCOPY WITH PROPOFOL (N/A )  Patient location during evaluation: Phase II Anesthesia Type: General Level of consciousness: awake and alert and oriented Pain management: pain level controlled Vital Signs Assessment: post-procedure vital signs reviewed and stable Respiratory status: spontaneous breathing, nonlabored ventilation and respiratory function stable Cardiovascular status: blood pressure returned to baseline and stable Postop Assessment: no apparent nausea or vomiting Anesthetic complications: no   No complications documented.   Last Vitals:  Vitals:   03/16/21 0653 03/16/21 0753  BP: 117/73 121/76  Pulse: 71 66  Resp: (!) 21   Temp: 36.7 C 36.7 C  SpO2: 100% 96%    Last Pain:  Vitals:   03/16/21 0753  TempSrc: Axillary  PainSc: 0-No pain                 Orlie Dakin

## 2021-03-22 ENCOUNTER — Encounter (HOSPITAL_COMMUNITY): Payer: Self-pay | Admitting: Internal Medicine

## 2021-04-04 ENCOUNTER — Other Ambulatory Visit: Payer: Self-pay | Admitting: Family Medicine

## 2021-04-11 ENCOUNTER — Other Ambulatory Visit: Payer: Self-pay | Admitting: Family Medicine

## 2021-04-26 DIAGNOSIS — M47816 Spondylosis without myelopathy or radiculopathy, lumbar region: Secondary | ICD-10-CM | POA: Diagnosis not present

## 2021-04-26 DIAGNOSIS — M961 Postlaminectomy syndrome, not elsewhere classified: Secondary | ICD-10-CM | POA: Diagnosis not present

## 2021-04-26 DIAGNOSIS — M5416 Radiculopathy, lumbar region: Secondary | ICD-10-CM | POA: Diagnosis not present

## 2021-05-04 DIAGNOSIS — M5416 Radiculopathy, lumbar region: Secondary | ICD-10-CM | POA: Diagnosis not present

## 2021-05-09 ENCOUNTER — Other Ambulatory Visit: Payer: Self-pay | Admitting: Family Medicine

## 2021-05-09 NOTE — Telephone Encounter (Signed)
LFD 03/07/21 #60 with no refills LOV 01/17/21 NOV 07/21/21

## 2021-07-08 ENCOUNTER — Other Ambulatory Visit: Payer: Self-pay | Admitting: Family Medicine

## 2021-07-08 NOTE — Telephone Encounter (Signed)
Patient is requesting a refill of the following medications: Requested Prescriptions   Pending Prescriptions Disp Refills   diclofenac (VOLTAREN) 75 MG EC tablet [Pharmacy Med Name: diclofenac sodium 75 mg tablet,delayed release] 60 tablet 0    Sig: TAKE (1) TABLET TWICE A DAY AS NEEDED.    Date of patient request: 07/08/21 [name] pt  Last office visit: 01/17/21 Date of last refill: 05/10/21 Last refill amount: 60 Follow up time period per chart: 6 months

## 2021-07-21 ENCOUNTER — Encounter: Payer: Medicare Other | Admitting: Family Medicine

## 2021-07-26 ENCOUNTER — Encounter: Payer: Self-pay | Admitting: Registered Nurse

## 2021-07-26 ENCOUNTER — Ambulatory Visit (INDEPENDENT_AMBULATORY_CARE_PROVIDER_SITE_OTHER): Payer: Medicare Other | Admitting: Registered Nurse

## 2021-07-26 ENCOUNTER — Other Ambulatory Visit: Payer: Self-pay

## 2021-07-26 VITALS — BP 129/62 | HR 61 | Temp 98.3°F | Resp 18 | Ht 72.0 in | Wt 215.8 lb

## 2021-07-26 DIAGNOSIS — Z125 Encounter for screening for malignant neoplasm of prostate: Secondary | ICD-10-CM | POA: Diagnosis not present

## 2021-07-26 DIAGNOSIS — Z0000 Encounter for general adult medical examination without abnormal findings: Secondary | ICD-10-CM

## 2021-07-26 DIAGNOSIS — M1A9XX0 Chronic gout, unspecified, without tophus (tophi): Secondary | ICD-10-CM

## 2021-07-26 DIAGNOSIS — I10 Essential (primary) hypertension: Secondary | ICD-10-CM | POA: Diagnosis not present

## 2021-07-26 DIAGNOSIS — E785 Hyperlipidemia, unspecified: Secondary | ICD-10-CM | POA: Diagnosis not present

## 2021-07-26 DIAGNOSIS — Z23 Encounter for immunization: Secondary | ICD-10-CM | POA: Diagnosis not present

## 2021-07-26 DIAGNOSIS — M25511 Pain in right shoulder: Secondary | ICD-10-CM

## 2021-07-26 DIAGNOSIS — M25512 Pain in left shoulder: Secondary | ICD-10-CM

## 2021-07-26 DIAGNOSIS — M1712 Unilateral primary osteoarthritis, left knee: Secondary | ICD-10-CM

## 2021-07-26 DIAGNOSIS — G8929 Other chronic pain: Secondary | ICD-10-CM

## 2021-07-26 DIAGNOSIS — J302 Other seasonal allergic rhinitis: Secondary | ICD-10-CM

## 2021-07-26 LAB — CBC WITH DIFFERENTIAL/PLATELET
Basophils Absolute: 0.1 10*3/uL (ref 0.0–0.1)
Basophils Relative: 0.8 % (ref 0.0–3.0)
Eosinophils Absolute: 0.1 10*3/uL (ref 0.0–0.7)
Eosinophils Relative: 2.1 % (ref 0.0–5.0)
HCT: 44.2 % (ref 39.0–52.0)
Hemoglobin: 14.1 g/dL (ref 13.0–17.0)
Lymphocytes Relative: 20.5 % (ref 12.0–46.0)
Lymphs Abs: 1.5 10*3/uL (ref 0.7–4.0)
MCHC: 32 g/dL (ref 30.0–36.0)
MCV: 89.2 fl (ref 78.0–100.0)
Monocytes Absolute: 0.8 10*3/uL (ref 0.1–1.0)
Monocytes Relative: 10.9 % (ref 3.0–12.0)
Neutro Abs: 4.7 10*3/uL (ref 1.4–7.7)
Neutrophils Relative %: 65.7 % (ref 43.0–77.0)
Platelets: 196 10*3/uL (ref 150.0–400.0)
RBC: 4.95 Mil/uL (ref 4.22–5.81)
RDW: 14.7 % (ref 11.5–15.5)
WBC: 7.1 10*3/uL (ref 4.0–10.5)

## 2021-07-26 LAB — LIPID PANEL
Cholesterol: 140 mg/dL (ref 0–200)
HDL: 32.8 mg/dL — ABNORMAL LOW (ref >39.00)
NonHDL: 107.15
Total CHOL/HDL Ratio: 4
Triglycerides: 289 mg/dL — ABNORMAL HIGH (ref 0.0–149.0)
VLDL: 57.8 mg/dL — ABNORMAL HIGH (ref 0.0–40.0)

## 2021-07-26 LAB — COMPREHENSIVE METABOLIC PANEL
ALT: 23 U/L (ref 0–53)
AST: 16 U/L (ref 0–37)
Albumin: 4.3 g/dL (ref 3.5–5.2)
Alkaline Phosphatase: 89 U/L (ref 39–117)
BUN: 23 mg/dL (ref 6–23)
CO2: 28 mEq/L (ref 19–32)
Calcium: 9.6 mg/dL (ref 8.4–10.5)
Chloride: 104 mEq/L (ref 96–112)
Creatinine, Ser: 1.31 mg/dL (ref 0.40–1.50)
GFR: 54.33 mL/min — ABNORMAL LOW (ref >60.00)
Glucose, Bld: 72 mg/dL (ref 70–99)
Potassium: 4.2 mEq/L (ref 3.5–5.1)
Sodium: 141 mEq/L (ref 135–145)
Total Bilirubin: 0.6 mg/dL (ref 0.2–1.2)
Total Protein: 6.8 g/dL (ref 6.0–8.3)

## 2021-07-26 LAB — LDL CHOLESTEROL, DIRECT: Direct LDL: 77 mg/dL

## 2021-07-26 LAB — PSA, MEDICARE: PSA: 0.43 ng/ml (ref 0.10–4.00)

## 2021-07-26 LAB — TSH: TSH: 1.47 u[IU]/mL (ref 0.35–5.50)

## 2021-07-26 LAB — HEMOGLOBIN A1C: Hgb A1c MFr Bld: 5.9 % (ref 4.6–6.5)

## 2021-07-26 MED ORDER — CETIRIZINE HCL 10 MG PO TABS: 10.0000 mg | ORAL_TABLET | Freq: Every day | ORAL | 3 refills | Status: DC

## 2021-07-26 MED ORDER — GABAPENTIN 300 MG PO CAPS
300.0000 mg | ORAL_CAPSULE | Freq: Two times a day (BID) | ORAL | 1 refills | Status: DC | PRN
Start: 2021-07-26 — End: 2022-08-01

## 2021-07-26 MED ORDER — ALLOPURINOL 100 MG PO TABS: 100.0000 mg | ORAL_TABLET | Freq: Every day | ORAL | 1 refills | Status: DC

## 2021-07-26 MED ORDER — FLUTICASONE PROPIONATE 50 MCG/ACT NA SUSP
2.0000 | Freq: Every day | NASAL | 6 refills | Status: DC
Start: 2021-07-26 — End: 2022-03-23

## 2021-07-26 MED ORDER — DOCUSATE SODIUM 100 MG PO CAPS: 100.0000 mg | ORAL_CAPSULE | Freq: Two times a day (BID) | ORAL | 0 refills | Status: DC

## 2021-07-26 MED ORDER — DICLOFENAC SODIUM 75 MG PO TBEC: DELAYED_RELEASE_TABLET | ORAL | 0 refills | Status: DC

## 2021-07-26 MED ORDER — METOPROLOL SUCCINATE ER 25 MG PO TB24
25.0000 mg | ORAL_TABLET | Freq: Every day | ORAL | 1 refills | Status: DC
Start: 2021-07-26 — End: 2022-03-01

## 2021-07-26 MED ORDER — SIMVASTATIN 40 MG PO TABS: 40.0000 mg | ORAL_TABLET | Freq: Every day | ORAL | 1 refills | Status: DC

## 2021-07-26 MED ORDER — LISINOPRIL-HYDROCHLOROTHIAZIDE 10-12.5 MG PO TABS: 1.0000 | ORAL_TABLET | Freq: Every day | ORAL | 1 refills | Status: DC

## 2021-07-26 NOTE — Progress Notes (Signed)
Established Patient Office Visit  Subjective:  Patient ID: [name], male    DOB: [birth year]  Age: 75 y.o. MRN: [id number]  CC:  Chief Complaint  Patient presents with   Annual Exam    Patient is here for a CPE and Lab work.    HPI [name] presents for CPE and labs  Histories reviewed and updated with patient.   No acute concerns  Needs medications refilled.  Due for shingles vaccination. Reviewed risks and benefits. Pt agrees to proceed.   Past Medical History:  Diagnosis Date   Arthritis    Cellulitis and abscess of other specified site    Colon polyps    Gout    Hypertension    Neuromuscular disorder ([place])    arthritis   Scoliosis     Past Surgical History:  Procedure Laterality Date   BACK SURGERY     COLONOSCOPY  01/10/2013   Procedure: COLONOSCOPY;  Surgeon: Rogene Houston, MD;  [facility];  Service: Endoscopy;  Laterality: N/A;  730   COLONOSCOPY N/A 08/21/2013   Procedure: COLONOSCOPY;  Surgeon: Rogene Houston, MD;  [facility];  Service: Endoscopy;  Laterality: N/A;  1030   COLONOSCOPY N/A 08/26/2015   Procedure: COLONOSCOPY;  Surgeon: Rogene Houston, MD;  [facility];  Service: Endoscopy;  Laterality: N/A;  240   COLONOSCOPY WITH PROPOFOL N/A 03/16/2021   Procedure: COLONOSCOPY WITH PROPOFOL;  Surgeon: Rogene Houston, MD;  [facility];  Service: Endoscopy;  Laterality: N/A;  patient knows to arrive at 0630.   colonscopy     PARTIAL KNEE ARTHROPLASTY Left 11/06/2017   Procedure: UNICOMPARTMENTAL KNEE;  Surgeon: [name], MD;  Location: [facility];  Service: Orthopedics;  Laterality: Left;    Family History  Problem Relation Age of Onset   Kidney disease Sister    Cancer Other        Family Hx of Cancer, CAD,Diabetes,Kidney Failure   Colon cancer Neg Hx     Social History   Socioeconomic History   Marital status: Married    Spouse name: Not on file   Number of children: 2    Years of education: Not on file   Highest education level: Not on file  Occupational History   Occupation: Retired     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  Tobacco Use   Smoking status: Former    Packs/day: 3.00    Years: 30.00    Pack years: 90.00    Types: Cigarettes    Start date: 12/18/1957    Quit date: 12/18/1996    Years since quitting: 24.6   Smokeless tobacco: Never   Tobacco comments:    started smoking as a child  Vaping Use   Vaping Use: Never used  Substance and Sexual Activity   Alcohol use: Yes    Alcohol/week: 0.0 standard drinks    Comment: beer occasionally   Drug use: No   Sexual activity: Not on file  Other Topics Concern   Not on file  Social History Narrative   2 sons; 81 Grandchildren       Enjoys fishing    Social Determinants of Radio broadcast assistant Strain: Low Risk    Difficulty of Paying Living Expenses: Not hard at all  Food Insecurity: No Food Insecurity   Worried About Charity fundraiser in the Last Year: Never true   Arboriculturist in the  Last Year: Never true  Transportation Needs: Not on file  Physical Activity: Not on file  Stress: No Stress Concern Present   Feeling of Stress : Not at all  Social Connections: Moderately Isolated   Frequency of Communication with Friends and Family: More than three times a week   Frequency of Social Gatherings with Friends and Family: More than three times a week   Attends Religious Services: Never   Marine scientist or Organizations: No   Attends Music therapist: Never   Marital Status: Married  Human resources officer Violence: Not At Risk   Fear of Current or Ex-Partner: No   Emotionally Abused: No   Physically Abused: No   Sexually Abused: No    Outpatient Medications Prior to Visit  Medication Sig Dispense Refill   allopurinol (ZYLOPRIM) 100 MG tablet Take 1 tablet (100 mg total) by mouth daily. 90 tablet 1   aspirin EC 81 MG tablet Take 81 mg by mouth daily.  Swallow whole.     cetirizine (ZYRTEC) 10 MG tablet Take 1 tablet (10 mg total) by mouth daily. 90 tablet 3   diclofenac (VOLTAREN) 75 MG EC tablet TAKE (1) TABLET TWICE A DAY AS NEEDED. 60 tablet 0   docusate sodium (COLACE) 100 MG capsule Take 1 capsule (100 mg total) by mouth 2 (two) times daily. To prevent constipation while taking pain medication. 60 capsule 0   EPINEPHrine 0.3 mg/0.3 mL IJ SOAJ injection Inject 0.3 mg into the muscle as needed for anaphylaxis.  0   fluticasone (FLONASE) 50 MCG/ACT nasal spray Place 2 sprays into both nostrils daily. 16 g 6   gabapentin (NEURONTIN) 300 MG capsule Take 1 capsule (300 mg total) by mouth 3 (three) times daily. (Patient taking differently: Take 300 mg by mouth 2 (two) times daily as needed (pain).) 90 capsule 1   HYDROcodone-acetaminophen (NORCO/VICODIN) 5-325 MG tablet Take 1 tablet by mouth every 6 (six) hours as needed for moderate pain.  0   lisinopril-hydrochlorothiazide (ZESTORETIC) 10-12.5 MG tablet Take 1 tablet by mouth daily. 90 tablet 1   metoprolol succinate (TOPROL-XL) 25 MG 24 hr tablet Take 1 tablet (25 mg total) by mouth daily. 90 tablet 1   simvastatin (ZOCOR) 40 MG tablet Take 1 tablet (40 mg total) by mouth daily. 90 tablet 1   No facility-administered medications prior to visit.    Allergies  Allergen Reactions   Bee Venom Anaphylaxis    ROS Review of Systems  Constitutional: Negative.   HENT: Negative.    Eyes: Negative.   Respiratory: Negative.    Cardiovascular: Negative.   Gastrointestinal: Negative.   Genitourinary: Negative.   Musculoskeletal: Negative.   Skin: Negative.   Neurological: Negative.   Psychiatric/Behavioral: Negative.    All other systems reviewed and are negative.    Objective:    Physical Exam Vitals and nursing note reviewed.  Constitutional:      General: He is not in acute distress.    Appearance: Normal appearance. He is normal weight. He is not ill-appearing, toxic-appearing or  diaphoretic.  HENT:     Head: Normocephalic and atraumatic.     Right Ear: Tympanic membrane, ear canal and external ear normal. There is no impacted cerumen.     Left Ear: Tympanic membrane, ear canal and external ear normal. There is no impacted cerumen.     Nose: Nose normal. No congestion or rhinorrhea.     Mouth/Throat:     Mouth: Mucous membranes are moist.  Pharynx: Oropharynx is clear. No oropharyngeal exudate or posterior oropharyngeal erythema.  Eyes:     General: No scleral icterus.       Right eye: No discharge.        Left eye: No discharge.     Extraocular Movements: Extraocular movements intact.     Conjunctiva/sclera: Conjunctivae normal.     Pupils: Pupils are equal, round, and reactive to light.  Neck:     Vascular: No carotid bruit.  Cardiovascular:     Rate and Rhythm: Normal rate and regular rhythm.     Pulses: Normal pulses.     Heart sounds: Normal heart sounds. No murmur heard.   No friction rub. No gallop.  Pulmonary:     Effort: Pulmonary effort is normal. No respiratory distress.     Breath sounds: Normal breath sounds. No stridor. No wheezing, rhonchi or rales.  Chest:     Chest wall: No tenderness.  Abdominal:     General: Abdomen is flat. Bowel sounds are normal. There is no distension.     Palpations: Abdomen is soft. There is no mass.     Tenderness: There is no abdominal tenderness. There is no right CVA tenderness, left CVA tenderness, guarding or rebound.     Hernia: No hernia is present.  Musculoskeletal:        General: No swelling, tenderness, deformity or signs of injury. Normal range of motion.     Cervical back: Normal range of motion and neck supple. No rigidity or tenderness.     Right lower leg: No edema.     Left lower leg: No edema.  Lymphadenopathy:     Cervical: No cervical adenopathy.  Skin:    General: Skin is warm and dry.     Capillary Refill: Capillary refill takes less than 2 seconds.     Coloration: Skin is not  jaundiced or pale.     Findings: No bruising, erythema, lesion or rash.  Neurological:     General: No focal deficit present.     Mental Status: He is alert and oriented to person, place, and time. Mental status is at baseline.     Cranial Nerves: No cranial nerve deficit.     Motor: No weakness.     Gait: Gait normal.  Psychiatric:        Mood and Affect: Mood normal.        Behavior: Behavior normal.        Thought Content: Thought content normal.        Judgment: Judgment normal.    BP 129/62   Pulse 61   Temp 98.3 F (36.8 C) (Temporal)   Resp 18   Ht 6' (1.829 m)   Wt 215 lb 12.8 oz (97.9 kg)   SpO2 98%   BMI 29.27 kg/m  Wt Readings from Last 3 Encounters:  07/26/21 215 lb 12.8 oz (97.9 kg)  03/14/21 213 lb (96.6 kg)  02/21/21 213 lb (96.6 kg)     Health Maintenance Due  Topic Date Due   INFLUENZA VACCINE  07/18/2021    There are no preventive care reminders to display for this patient.  Lab Results  Component Value Date   TSH 2.74 01/17/2021   Lab Results  Component Value Date   WBC 8.2 01/17/2021   HGB 15.1 01/17/2021   HCT 46.3 01/17/2021   MCV 86.0 01/17/2021   PLT 201.0 01/17/2021   Lab Results  Component Value Date   NA 139 01/17/2021  K 4.0 01/17/2021   CO2 27 01/17/2021   GLUCOSE 91 01/17/2021   BUN 25 (H) 01/17/2021   CREATININE 1.36 01/17/2021   BILITOT 0.5 01/17/2021   ALKPHOS 101 01/17/2021   AST 17 01/17/2021   ALT 26 01/17/2021   PROT 7.1 01/17/2021   ALBUMIN 4.3 01/17/2021   CALCIUM 9.7 01/17/2021   ANIONGAP 8 10/26/2017   GFR 52.13 (L) 01/17/2021   Lab Results  Component Value Date   CHOL 117 01/17/2021   Lab Results  Component Value Date   HDL 33.00 (L) 01/17/2021   Lab Results  Component Value Date   LDLCALC 59 01/17/2021   Lab Results  Component Value Date   TRIG 125.0 01/17/2021   Lab Results  Component Value Date   CHOLHDL 4 01/17/2021   No results found for: HGBA1C    Assessment & Plan:   Problem  List Items Addressed This Visit       Cardiovascular and Mediastinum   Essential hypertension (Chronic)   Relevant Orders   CBC with Differential/Platelet   Comprehensive metabolic panel   Hemoglobin A1c   Lipid panel   TSH     Other   Hyperlipidemia (Chronic)   Relevant Orders   CBC with Differential/Platelet   Comprehensive metabolic panel   Hemoglobin A1c   Lipid panel   TSH   Other Visit Diagnoses     Annual physical exam    -  Primary   Screening PSA (prostate specific antigen)       Relevant Orders   PSA, Medicare ( [place] [facility] only)   Need for shingles vaccine       Relevant Orders   Varicella-zoster vaccine IM (Shingrix)       No orders of the defined types were placed in this encounter.   Follow-up: Return in about 6 months (around 01/26/2022) for Chronic conditions.   PLAN Exam unremarkabe Labs collected. Will follow up with the patient as warranted. Shingrix dose 1 given. Due for next dose at 6 mo follow up Return in 6 mo for check on chronic conditions Patient encouraged to call clinic with any questions, comments, or concerns.   Maximiano Coss, NP

## 2021-07-26 NOTE — Patient Instructions (Addendum)
Mr. Treyvaughn Deruyter to meet you  No concerns on exam today. Vitals looking good  Let's see how labs look - I'll call with any urgent concerns.  See you in 6 mo - myself or Dr. Birdie Riddle Can do shingles shot #2 at that time.  Thank you  [name]

## 2021-08-01 ENCOUNTER — Telehealth: Payer: Self-pay

## 2021-08-01 NOTE — Chronic Care Management (AMB) (Signed)
Chronic Care Management Pharmacy Assistant   Name: [name]  MRN: [id number] DOB: October 07, 1946   Reason for Encounter: Disease State - General Adherence / Reschedule CPP Visit    Recent office visits:  07/26/21 Steven Hai, NP - Family Medicine - Labs were ordered. Shingrix dose 1 given. No medication changes. Follow up in 6 months.   02/14/21 Steven Chyle, MD - Family Medicine - Sinusitis (Video Visit) - Amoxicillin-clavulanate (AUGMENTIN) 875-125 MG tablet Take 1 tablet by mouth 2 (two) times daily prescribed. Azelastine (ASTELIN) 0.1 % nasal spray Place 2 sprays into both nostrils 2 (two) times daily prescribed. Follow up as needed.   01/17/21 Steven Asa, MD - Family Medicine - Hypertension - Labs were ordered. No medication changes. Follow up in 6 months for physical.   Recent consult visits:  04/26/21 Steven Hind, MD - Neurosurgery - Lumbar Spondylosis - No notes available.  02/21/21 Steven Reus, PA-C - Neurosurgery - Lumbar Spondylosis - No notes available.   Hospital visits:  03/16/21  Medication Reconciliation was completed by comparing discharge summary, patient's EMR and Pharmacy list, and upon discussion with patient.  Admitted to the hospital on 03/16/21 due to History of Colonic polyps. Discharge date was 03/16/21. Discharged from [place]?Medications Started at [facility] Discharge:?? Started docusate sodium (COLACE) 100 MG capsule Take 1 capsule (100 mg total) by mouth 2 (two) times daily. To prevent constipation while taking pain medication. Fluticasone (FLONASE) 50 MCG/ACT nasal spray Place 2 sprays into both nostrils daily.  Medication Changes at Hospital Discharge: No medication changes were noted.  Medications Discontinued at Hospital Discharge: No medications were discontinued at discharge.   Medications that remain the same after Hospital Discharge:??  All other medications will remain the same.    Medications: Outpatient  Encounter Medications as of 08/01/2021  Medication Sig   allopurinol (ZYLOPRIM) 100 MG tablet Take 1 tablet (100 mg total) by mouth daily.   aspirin EC 81 MG tablet Take 81 mg by mouth daily. Swallow whole.   cetirizine (ZYRTEC) 10 MG tablet Take 1 tablet (10 mg total) by mouth daily.   diclofenac (VOLTAREN) 75 MG EC tablet TAKE (1) TABLET TWICE A DAY AS NEEDED.   docusate sodium (COLACE) 100 MG capsule Take 1 capsule (100 mg total) by mouth 2 (two) times daily. To prevent constipation while taking pain medication.   EPINEPHrine 0.3 mg/0.3 mL IJ SOAJ injection Inject 0.3 mg into the muscle as needed for anaphylaxis.   fluticasone (FLONASE) 50 MCG/ACT nasal spray Place 2 sprays into both nostrils daily.   gabapentin (NEURONTIN) 300 MG capsule Take 1 capsule (300 mg total) by mouth 2 (two) times daily as needed (pain).   HYDROcodone-acetaminophen (NORCO/VICODIN) 5-325 MG tablet Take 1 tablet by mouth every 6 (six) hours as needed for moderate pain.   lisinopril-hydrochlorothiazide (ZESTORETIC) 10-12.5 MG tablet Take 1 tablet by mouth daily.   metoprolol succinate (TOPROL-XL) 25 MG 24 hr tablet Take 1 tablet (25 mg total) by mouth daily.   simvastatin (ZOCOR) 40 MG tablet Take 1 tablet (40 mg total) by mouth daily.   No facility-administered encounter medications on file as of 08/01/2021.    Have you had any problems recently with your health? Patient denied any recent issues with his health.   Have you had any problems with your pharmacy? Patient denied any problems with his current pharmacy.   What issues or side effects are you having with your medications? Patient denied any issues  or side effects with his current medications.   What would you like me to pass along to [name], CPP for them to help you with?  Patient did not have anything he would like to pass along at this time. He stated he is doing well.   What can we do to take care of you better? Patient did not have any  recommendations at this time and is satisfied with his current level of care.   Star Rating Drugs: Simvastatin (ZOCOR) 40 MG tablet - last filled 05/18/21 90 days  Lisinopril-hydrochlorothiazide (ZESTORETIC) 10-12.5 MG tablet - last filled 06/28/21 90 days    Future Appointments  Date Time Provider [place]  09/30/2021  1:30 PM [facility] CCM PHARMACIST [facility]   Patient rescheduled for Follow up CPP phone visit for : October 19 th @ 8:00 am. Patient confirmed apopintment date and time.   [name], [place] Pharmacist Assistant  [phone]  Time Spent: 25 minutes

## 2021-08-11 ENCOUNTER — Encounter: Payer: Self-pay | Admitting: Registered Nurse

## 2021-09-28 ENCOUNTER — Other Ambulatory Visit: Payer: Self-pay | Admitting: Family Medicine

## 2021-09-28 DIAGNOSIS — I10 Essential (primary) hypertension: Secondary | ICD-10-CM

## 2021-09-30 ENCOUNTER — Telehealth: Payer: Medicare Other

## 2021-10-05 ENCOUNTER — Ambulatory Visit (INDEPENDENT_AMBULATORY_CARE_PROVIDER_SITE_OTHER): Payer: Medicare Other

## 2021-10-05 DIAGNOSIS — I10 Essential (primary) hypertension: Secondary | ICD-10-CM

## 2021-10-05 DIAGNOSIS — E785 Hyperlipidemia, unspecified: Secondary | ICD-10-CM

## 2021-10-05 DIAGNOSIS — M1A9XX0 Chronic gout, unspecified, without tophus (tophi): Secondary | ICD-10-CM

## 2021-10-05 NOTE — Patient Instructions (Signed)
[name],  Thank you for talking with me today. I have included our care plan/goals in the following pages.   Please review and call me at [phone] with any questions.  Thanks! Ellin Mayhew, PharmD Clinical Pharmacist  [phone]  Care Plan : ccm pharmacy care plan  Updates made by [name], [facility] since 10/05/2021 12:00 AM     Problem: HLD HTN Gout OA      Long-Range Goal: disease management   Start Date: 10/05/2021  Expected End Date: 10/05/2022  This Visit's Progress: On track  Priority: High  Note:   Current Barriers:  Potential suboptimal therapeutic regimen for HTN   Pharmacist Clinical Goal(s):  Patient will contact provider office for questions/concerns as evidenced notation of same in electronic health record through collaboration with PharmD and provider.   Interventions: 1:1 collaboration with Midge Minium, MD regarding development and update of comprehensive plan of care as evidenced by provider attestation and co-signature Inter-disciplinary care team collaboration (see longitudinal plan of care) Comprehensive medication review performed; medication list updated in electronic medical record  Hypertension (BP goal <130/80) -Controlled -Current treatment: Lisinopril-HCTZ 10-12.5 mg once daily Metoprolol succinate 25 mg once daily  -Medications previously tried: n/a  -Current home readings: none provided  -Current dietary habits: high carb, high salt. crackers, hot dog.  -Current exercise habits: fishing, yard work  -Denies hypotensive/hypertensive symptoms -Educated on Exercise goal of 150 minutes per week; -Counseled on diet and exercise extensively Recommended to continue current medication  Hyperlipidemia: (LDL goal < 100) -Controlled -Current treatment: Simvastatin 40 mg once daily  -Medications previously tried: n/a  -Current dietary patterns: see htn -Current exercise habits: see htn -Reviewed adherence and  tolerability - no problems noted  -Educated on Cholesterol goals;  Importance of limiting foods high in cholesterol; -Recommended to continue current medication  Gout (Goal: prevent attack) -Controlled -Denies any gouty attacks 2+ years -Current treatment  Allopurinol 100 mg once daily -Medications previously tried: n/a -Counseled on diet  -Recommended to continue current medication  Patient Goals/Self-Care Activities Patient will:  - take medications as prescribed    The patient verbalized understanding of instructions provided today and agreed to receive a mailed copy of patient instruction and/or educational materials. Telephone follow up appointment with pharmacy team member scheduled for: See next appointment with "Care Management Staff" under "What's Next" below.

## 2021-10-05 NOTE — Progress Notes (Signed)
Chronic Care Management Pharmacy Note  10/05/2021 Name:  [name] MRN:  [id number] DOB:  January 01, 1946  Summary: No Rx changes  Unable to determine indication for metoprolol. Noted hx of chest pain/DOE possibly due to acute pericarditis, otherwise no recurrent arrhythmias, CHF or CAD noted. Consider switch to amlodipine for BP control at f/u if appropriate.  Subjective: [name] is an 75 y.o. year old male who is a primary patient of Tabori, Aundra Millet, MD.  The CCM team was consulted for assistance with disease management and care coordination needs.    Engaged with patient by telephone for follow up visit in response to provider referral for pharmacy case management and/or care coordination services.   Consent to Services:  The patient was given information about Chronic Care Management services, agreed to services, and gave verbal consent prior to initiation of services.  Please see initial visit note for detailed documentation.   Patient Care Team: Midge Minium, MD as PCP - General (Family Medicine) Jovita Gamma, MD as Consulting Physician (Neurosurgery) Herminio Commons, MD (Inactive) as Attending Physician (Cardiology) Rogene Houston, MD as Consulting Physician (Gastroenterology) [name], [facility] as Pharmacist (Pharmacist)  Hospital visits: None in previous 6 months  Objective:  Lab Results  Component Value Date   CREATININE 1.31 07/26/2021   CREATININE 1.36 01/17/2021   CREATININE 1.29 07/19/2020    Lab Results  Component Value Date   HGBA1C 5.9 07/26/2021   Last diabetic Eye exam: No results found for: HMDIABEYEEXA  Last diabetic Foot exam: No results found for: HMDIABFOOTEX      Component Value Date/Time   CHOL 140 07/26/2021 0929   CHOL 117 01/17/2021 0851   CHOL 138 07/19/2020 1052   TRIG 289.0 (H) 07/26/2021 0929   TRIG 125.0 01/17/2021 0851   TRIG 192.0 (H) 07/19/2020 1052   HDL 32.80 (L) 07/26/2021 0929   HDL 33.00 (L)  01/17/2021 0851   HDL 33.80 (L) 07/19/2020 1052   CHOLHDL 4 07/26/2021 0929   VLDL 57.8 (H) 07/26/2021 0929   LDLCALC 59 01/17/2021 0851   LDLCALC 66 07/19/2020 1052   LDLCALC 63 12/30/2018 0958   LDLDIRECT 77.0 07/26/2021 0929   LDLDIRECT 71.0 01/14/2020 0906   LDLDIRECT 68.0 07/11/2019 0826    Hepatic Function Latest Ref Rng & Units 07/26/2021 01/17/2021 07/19/2020  Total Protein 6.0 - 8.3 g/dL 6.8 7.1 6.7  Albumin 3.5 - 5.2 g/dL 4.3 4.3 4.3  AST 0 - 37 U/L '16 17 18  ' ALT 0 - 53 U/L '23 26 27  ' Alk Phosphatase 39 - 117 U/L 89 101 105  Total Bilirubin 0.2 - 1.2 mg/dL 0.6 0.5 0.4  Bilirubin, Direct 0.0 - 0.3 mg/dL - 0.1 0.1    Lab Results  Component Value Date/Time   TSH 1.47 07/26/2021 09:29 AM   TSH 2.74 01/17/2021 08:51 AM    CBC Latest Ref Rng & Units 07/26/2021 01/17/2021 07/19/2020  WBC 4.0 - 10.5 K/uL 7.1 8.2 7.8  Hemoglobin 13.0 - 17.0 g/dL 14.1 15.1 14.7  Hematocrit 39.0 - 52.0 % 44.2 46.3 44.5  Platelets 150.0 - 400.0 K/uL 196.0 201.0 206.0    No results found for: VD25OH  Clinical ASCVD:  The 10-year ASCVD risk score (Arnett DK, et al., 2019) is: 24.3%   Values used to calculate the score:     Age: 23 years     Sex: Male     Is Non-Hispanic African American: No     Diabetic: No  Tobacco smoker: No     Systolic Blood Pressure: 355 mmHg     Is BP treated: Yes     HDL Cholesterol: 32.8 mg/dL     Total Cholesterol: 140 mg/dL   Social History   Tobacco Use  Smoking Status Former   Packs/day: 3.00   Years: 30.00   Pack years: 90.00   Types: Cigarettes   Start date: 12/18/1957   Quit date: 12/18/1996   Years since quitting: 24.8  Smokeless Tobacco Never  Tobacco Comments   started smoking as a child   BP Readings from Last 3 Encounters:  07/26/21 129/62  03/16/21 121/76  03/14/21 (!) 143/90   Pulse Readings from Last 3 Encounters:  07/26/21 61  03/16/21 66  03/14/21 63   Wt Readings from Last 3 Encounters:  07/26/21 215 lb 12.8 oz (97.9 kg)  03/14/21  213 lb (96.6 kg)  02/21/21 213 lb (96.6 kg)   BMI Readings from Last 3 Encounters:  07/26/21 29.27 kg/m  03/14/21 28.89 kg/m  02/21/21 28.89 kg/m   Assessment: Review of patient past medical history, allergies, medications, health status, including review of consultants reports, laboratory and other test data, was performed as part of comprehensive evaluation and provision of chronic care management services.   SDOH:  (Social Determinants of Health) assessments and interventions performed: Yes   CCM Care Plan  Allergies  Allergen Reactions   Bee Venom Anaphylaxis    Medications Reviewed Today     Reviewed by [name], [facility] (Pharmacist) on 10/05/21 at [place]  Med List Status: <None>   Medication Order Taking? Sig Documenting Provider Last Dose Status Informant  allopurinol (ZYLOPRIM) 100 MG tablet [id number] Yes Take 1 tablet (100 mg total) by mouth daily. Maximiano Coss, NP  Active   aspirin EC 81 MG tablet [id number] Yes Take 81 mg by mouth daily. Swallow whole. [provider]  Active Self  cetirizine (ZYRTEC) 10 MG tablet [id number]  Take 1 tablet (10 mg total) by mouth daily. Maximiano Coss, NP  Active   diclofenac (VOLTAREN) 75 MG EC tablet [id number] Yes TAKE (1) TABLET TWICE A DAY AS NEEDED. Maximiano Coss, NP  Active   docusate sodium (COLACE) 100 MG capsule [id number]  Take 1 capsule (100 mg total) by mouth 2 (two) times daily. To prevent constipation while taking pain medication. Maximiano Coss, NP  Active   EPINEPHrine 0.3 mg/0.3 mL IJ SOAJ injection [id number]  Inject 0.3 mg into the muscle as needed for anaphylaxis. [provider]  Active Self  fluticasone (FLONASE) 50 MCG/ACT nasal spray [id number]  Place 2 sprays into both nostrils daily. Maximiano Coss, NP  Active   gabapentin (NEURONTIN) 300 MG capsule [id number] Yes Take 1 capsule (300 mg total) by mouth 2 (two) times daily as needed (pain). Maximiano Coss, NP  Active   HYDROcodone-acetaminophen  (NORCO/VICODIN) 5-325 MG tablet [id number]  Take 1 tablet by mouth every 6 (six) hours as needed for moderate pain. [provider]  Active Self  lisinopril-hydrochlorothiazide (ZESTORETIC) 10-12.5 MG tablet [id number] Yes TAKE 1 TABLET DAILY Midge Minium, MD  Active   metoprolol succinate (TOPROL-XL) 25 MG 24 hr tablet [id number] Yes Take 1 tablet (25 mg total) by mouth daily. Maximiano Coss, NP  Active   simvastatin (ZOCOR) 40 MG tablet [id number] Yes Take 1 tablet (40 mg total) by mouth daily. Maximiano Coss, NP  Active             Patient Active Problem List   Diagnosis Date Noted  Overweight (BMI 25.0-29.9) 01/14/2020   Physical exam 07/11/2019   Primary osteoarthritis of left knee 10/22/2017   Gout 01/19/2017   Chest pain 09/01/2014   Coronary artery disease excluded 05/20/2012   Hyperlipidemia 04/29/2011   Essential hypertension 11/13/2007    Immunization History  Administered Date(s) Administered   Moderna Sars-Covid-2 Vaccination 02/19/2020, 03/23/2020   Pneumococcal Conjugate-13 07/18/2017   Pneumococcal Polysaccharide-23 12/30/2018   Tdap 07/30/2007   Zoster Recombinat (Shingrix) 07/26/2021   Zoster, Live 01/19/2015    Conditions to be addressed/monitored: HLD HTN Gout OA  Care Plan : ccm pharmacy care plan  Updates made by [name], [place] since 10/05/2021 12:00 AM     Problem: HLD HTN Gout OA      Long-Range Goal: disease management   Start Date: 10/05/2021  Expected End Date: 10/05/2022  This Visit's Progress: On track  Priority: High  Note:   Current Barriers:  Potential suboptimal therapeutic regimen for HTN   Pharmacist Clinical Goal(s):  Patient will contact provider office for questions/concerns as evidenced notation of same in electronic health record through collaboration with PharmD and provider.   Interventions: 1:1 collaboration with Midge Minium, MD regarding development and update of comprehensive plan of care as  evidenced by provider attestation and co-signature Inter-disciplinary care team collaboration (see longitudinal plan of care) Comprehensive medication review performed; medication list updated in electronic medical record  Hypertension (BP goal <130/80) -Controlled -Hx of acute pericarditis and chest pain. Did not see note of recurrent arrhthymias, CAD.  -Current treatment: Lisinopril-HCTZ 10-12.5 mg once daily Metoprolol succinate 25 mg once daily  -Medications previously tried: n/a  -Current home readings: none provided  -Current dietary habits: high carb, high salt. crackers, hot dog.  -Current exercise habits: fishing, yard work  -Denies hypotensive/hypertensive symptoms -Educated on Exercise goal of 150 minutes per week; -Counseled on diet and exercise extensively Recommended to continue current medication  Hyperlipidemia: (LDL goal < 100) -Controlled -Current treatment: Simvastatin 40 mg once daily  -Medications previously tried: n/a  -Current dietary patterns: see htn -Current exercise habits: see htn -Reviewed adherence and tolerability - no problems noted  -Educated on Cholesterol goals;  Importance of limiting foods high in cholesterol; -Recommended to continue current medication  Gout (Goal: prevent attack) -Controlled -Denies any gouty attacks 2+ years -Current treatment  Allopurinol 100 mg once daily -Medications previously tried: n/a -Counseled on diet  -Recommended to continue current medication  Patient Goals/Self-Care Activities Patient will:  - take medications as prescribed     Medication Assistance: None required.  Patient affirms current coverage meets needs.  Patient's preferred pharmacy is:  [place] [facility], [place] [address] [address] Phone: [phone] Fax: [phone]   Pt endorses 100% compliance  Follow Up:  Patient agrees to Care Plan and Follow-up. Plan: HC 5 month  BP. Pharmacist 8 month telephone f/u appt  Future Appointments  Date Time Provider [place]  06/07/2022  8:30 AM [facility]CM PHARMACIST [facility] [place], PharmD, CPP Clinical Pharmacist Practitioner  [place]  [phone]

## 2021-10-17 DIAGNOSIS — I10 Essential (primary) hypertension: Secondary | ICD-10-CM | POA: Diagnosis not present

## 2021-10-17 DIAGNOSIS — E785 Hyperlipidemia, unspecified: Secondary | ICD-10-CM

## 2021-12-16 ENCOUNTER — Telehealth (INDEPENDENT_AMBULATORY_CARE_PROVIDER_SITE_OTHER): Payer: Medicare Other | Admitting: Family Medicine

## 2021-12-16 ENCOUNTER — Encounter: Payer: Self-pay | Admitting: Family Medicine

## 2021-12-16 VITALS — Ht 72.01 in | Wt 212.0 lb

## 2021-12-16 DIAGNOSIS — U071 COVID-19: Secondary | ICD-10-CM | POA: Diagnosis not present

## 2021-12-16 MED ORDER — ALBUTEROL SULFATE HFA 108 (90 BASE) MCG/ACT IN AERS: 2.0000 | INHALATION_SPRAY | Freq: Four times a day (QID) | RESPIRATORY_TRACT | 0 refills | Status: DC | PRN

## 2021-12-16 MED ORDER — MOLNUPIRAVIR EUA 200MG CAPSULE: 4.0000 | ORAL_CAPSULE | Freq: Two times a day (BID) | ORAL | 0 refills | Status: AC

## 2021-12-16 MED ORDER — BENZONATATE 200 MG PO CAPS: 200.0000 mg | ORAL_CAPSULE | Freq: Three times a day (TID) | ORAL | 0 refills | Status: DC | PRN

## 2021-12-16 MED ORDER — GUAIFENESIN-CODEINE 100-10 MG/5ML PO SYRP: 10.0000 mL | ORAL_SOLUTION | Freq: Three times a day (TID) | ORAL | 0 refills | Status: DC | PRN

## 2021-12-16 NOTE — Progress Notes (Signed)
Virtual Visit via Video   I connected with patient on 12/16/21 at 11:30 AM EST by a video enabled telemedicine application and verified that I am speaking with the correct person using two identifiers.  Location patient: Home Location provider: [place] Bean, Office Persons participating in the virtual visit: Patient, Provider, [place] [name] E)  I discussed the limitations of evaluation and management by telemedicine and the availability of in person appointments. The patient expressed understanding and agreed to proceed.  Interactive audio and video telecommunications were attempted between this provider and patient, however failed, due to patient having technical difficulties OR patient did not have access to video capability.  We continued and completed visit with audio only.   Subjective:   HPI:   COVID- 'i feel terrible'.  Sxs started 12/27, tested + on 12/28.  Not able to sleep due to cough.  Cough is wet but nonproductive.  + fevers.  + body aches, chills.  + chest discomfort due to severity of cough.  Slight HA.  Denies SOB.  Denies N/V.  Taking Mucinex and Coricidin w/o relief.  ROS:   See pertinent positives and negatives per HPI.  Patient Active Problem List   Diagnosis Date Noted   Overweight (BMI 25.0-29.9) 01/14/2020   Physical exam 07/11/2019   Primary osteoarthritis of left knee 10/22/2017   Gout 01/19/2017   Chest pain 09/01/2014   Coronary artery disease excluded 05/20/2012   Hyperlipidemia 04/29/2011   Essential hypertension 11/13/2007    Social History   Tobacco Use   Smoking status: Former    Packs/day: 3.00    Years: 30.00    Pack years: 90.00    Types: Cigarettes    Start date: 12/18/1957    Quit date: 12/18/1996    Years since quitting: 25.0   Smokeless tobacco: Never   Tobacco comments:    started smoking as a child  Substance Use Topics   Alcohol use: Yes    Alcohol/week: 0.0 standard drinks    Comment: beer occasionally    Current  Outpatient Medications:    albuterol (VENTOLIN HFA) 108 (90 Base) MCG/ACT inhaler, Inhale 2 puffs into the lungs every 6 (six) hours as needed for wheezing or shortness of breath., Disp: 8 g, Rfl: 0   allopurinol (ZYLOPRIM) 100 MG tablet, Take 1 tablet (100 mg total) by mouth daily., Disp: 90 tablet, Rfl: 1   aspirin EC 81 MG tablet, Take 81 mg by mouth daily. Swallow whole., Disp: , Rfl:    benzonatate (TESSALON) 200 MG capsule, Take 1 capsule (200 mg total) by mouth 3 (three) times daily as needed for cough., Disp: 45 capsule, Rfl: 0   cetirizine (ZYRTEC) 10 MG tablet, Take 1 tablet (10 mg total) by mouth daily., Disp: 90 tablet, Rfl: 3   diclofenac (VOLTAREN) 75 MG EC tablet, TAKE (1) TABLET TWICE A DAY AS NEEDED., Disp: 60 tablet, Rfl: 0   docusate sodium (COLACE) 100 MG capsule, Take 1 capsule (100 mg total) by mouth 2 (two) times daily. To prevent constipation while taking pain medication., Disp: 60 capsule, Rfl: 0   EPINEPHrine 0.3 mg/0.3 mL IJ SOAJ injection, Inject 0.3 mg into the muscle as needed for anaphylaxis., Disp: , Rfl: 0   gabapentin (NEURONTIN) 300 MG capsule, Take 1 capsule (300 mg total) by mouth 2 (two) times daily as needed (pain)., Disp: 180 capsule, Rfl: 1   guaiFENesin-codeine (ROBITUSSIN AC) 100-10 MG/5ML syrup, Take 10 mLs by mouth 3 (three) times daily as needed for cough.,  Disp: 120 mL, Rfl: 0   HYDROcodone-acetaminophen (NORCO/VICODIN) 5-325 MG tablet, Take 1 tablet by mouth every 6 (six) hours as needed for moderate pain., Disp: , Rfl: 0   lisinopril-hydrochlorothiazide (ZESTORETIC) 10-12.5 MG tablet, TAKE 1 TABLET DAILY, Disp: 90 tablet, Rfl: 1   metoprolol succinate (TOPROL-XL) 25 MG 24 hr tablet, Take 1 tablet (25 mg total) by mouth daily., Disp: 90 tablet, Rfl: 1   molnupiravir EUA (LAGEVRIO) 200 mg CAPS capsule, Take 4 capsules (800 mg total) by mouth 2 (two) times daily for 5 days., Disp: 40 capsule, Rfl: 0   simvastatin (ZOCOR) 40 MG tablet, Take 1 tablet (40 mg  total) by mouth daily., Disp: 90 tablet, Rfl: 1   fluticasone (FLONASE) 50 MCG/ACT nasal spray, Place 2 sprays into both nostrils daily. (Patient not taking: Reported on 12/16/2021), Disp: 16 g, Rfl: 6  Allergies  Allergen Reactions   Bee Venom Anaphylaxis    Objective:   Ht 6' 0.01" (1.829 m)    Wt 212 lb (96.2 kg)    BMI 28.75 kg/m  Pt is able to speak clearly, coherently without shortness of breath or increased work of breathing. + hacking cough   Thought process is linear.  Mood is appropriate.   Assessment and Plan:   COVID- new.  Pt's sxs started on Tuesday and he tested + on Wednesday.  Well within the window for antiviral treatment.  Start Molnupiravir.  Cough meds prn.  Albuterol inhaler prn.  Reviewed supportive care and red flags that should prompt return.  Pt expressed understanding and is in agreement w/ plan.    Steven Asa, MD 12/16/2021  Time spent with the patient: 12 minutes, of which >50% was spent in obtaining information about symptoms, reviewing previous labs, evaluations, and treatments, counseling about condition (please see the discussed topics above), and developing a plan to further investigate it; had a number of questions which I addressed.

## 2022-01-11 DIAGNOSIS — H524 Presbyopia: Secondary | ICD-10-CM | POA: Diagnosis not present

## 2022-01-12 ENCOUNTER — Other Ambulatory Visit: Payer: Self-pay | Admitting: Registered Nurse

## 2022-01-12 DIAGNOSIS — H40033 Anatomical narrow angle, bilateral: Secondary | ICD-10-CM | POA: Diagnosis not present

## 2022-01-12 DIAGNOSIS — H2513 Age-related nuclear cataract, bilateral: Secondary | ICD-10-CM | POA: Diagnosis not present

## 2022-01-12 DIAGNOSIS — G8929 Other chronic pain: Secondary | ICD-10-CM

## 2022-01-12 DIAGNOSIS — M25511 Pain in right shoulder: Secondary | ICD-10-CM

## 2022-01-12 NOTE — Telephone Encounter (Signed)
Patient is requesting a refill of the following medications: Requested Prescriptions   Pending Prescriptions Disp Refills   diclofenac (VOLTAREN) 75 MG EC tablet [Pharmacy Med Name: diclofenac sodium 75 mg tablet,delayed release] 60 tablet 0    Sig: TAKE 1 TABLET 2 TIMES A DAY AS NEEDED    Date of patient request: 01/12/22 Last office visit: 07/26/21 Date of last refill: 07/26/21 Last refill amount: 60

## 2022-03-01 ENCOUNTER — Other Ambulatory Visit: Payer: Self-pay | Admitting: Registered Nurse

## 2022-03-01 DIAGNOSIS — M1712 Unilateral primary osteoarthritis, left knee: Secondary | ICD-10-CM

## 2022-03-01 DIAGNOSIS — I10 Essential (primary) hypertension: Secondary | ICD-10-CM

## 2022-03-23 ENCOUNTER — Encounter: Payer: Self-pay | Admitting: Family Medicine

## 2022-03-23 ENCOUNTER — Ambulatory Visit (INDEPENDENT_AMBULATORY_CARE_PROVIDER_SITE_OTHER): Payer: Medicare Other | Admitting: Family Medicine

## 2022-03-23 VITALS — BP 120/62 | HR 65 | Temp 98.3°F | Resp 16 | Wt 214.0 lb

## 2022-03-23 DIAGNOSIS — R051 Acute cough: Secondary | ICD-10-CM

## 2022-03-23 DIAGNOSIS — I10 Essential (primary) hypertension: Secondary | ICD-10-CM

## 2022-03-23 DIAGNOSIS — E785 Hyperlipidemia, unspecified: Secondary | ICD-10-CM

## 2022-03-23 LAB — HEPATIC FUNCTION PANEL
ALT: 22 U/L (ref 0–53)
AST: 17 U/L (ref 0–37)
Albumin: 4.3 g/dL (ref 3.5–5.2)
Alkaline Phosphatase: 94 U/L (ref 39–117)
Bilirubin, Direct: 0.2 mg/dL (ref 0.0–0.3)
Total Bilirubin: 0.7 mg/dL (ref 0.2–1.2)
Total Protein: 6.4 g/dL (ref 6.0–8.3)

## 2022-03-23 LAB — LIPID PANEL
Cholesterol: 118 mg/dL (ref 0–200)
HDL: 28.9 mg/dL — ABNORMAL LOW (ref >39.00)
NonHDL: 88.61
Total CHOL/HDL Ratio: 4
Triglycerides: 218 mg/dL — ABNORMAL HIGH (ref 0.0–149.0)
VLDL: 43.6 mg/dL — ABNORMAL HIGH (ref 0.0–40.0)

## 2022-03-23 LAB — CBC WITH DIFFERENTIAL/PLATELET
Basophils Absolute: 0 10*3/uL (ref 0.0–0.1)
Basophils Relative: 0.5 % (ref 0.0–3.0)
Eosinophils Absolute: 0.2 10*3/uL (ref 0.0–0.7)
Eosinophils Relative: 3.4 % (ref 0.0–5.0)
HCT: 42.5 % (ref 39.0–52.0)
Hemoglobin: 13.9 g/dL (ref 13.0–17.0)
Lymphocytes Relative: 21.8 % (ref 12.0–46.0)
Lymphs Abs: 1.3 10*3/uL (ref 0.7–4.0)
MCHC: 32.6 g/dL (ref 30.0–36.0)
MCV: 87.7 fl (ref 78.0–100.0)
Monocytes Absolute: 0.5 10*3/uL (ref 0.1–1.0)
Monocytes Relative: 8.9 % (ref 3.0–12.0)
Neutro Abs: 4 10*3/uL (ref 1.4–7.7)
Neutrophils Relative %: 65.4 % (ref 43.0–77.0)
Platelets: 206 10*3/uL (ref 150.0–400.0)
RBC: 4.84 Mil/uL (ref 4.22–5.81)
RDW: 14.5 % (ref 11.5–15.5)
WBC: 6.1 10*3/uL (ref 4.0–10.5)

## 2022-03-23 LAB — BASIC METABOLIC PANEL
BUN: 24 mg/dL — ABNORMAL HIGH (ref 6–23)
CO2: 26 mEq/L (ref 19–32)
Calcium: 9.3 mg/dL (ref 8.4–10.5)
Chloride: 105 mEq/L (ref 96–112)
Creatinine, Ser: 1.4 mg/dL (ref 0.40–1.50)
GFR: 49.93 mL/min — ABNORMAL LOW (ref >60.00)
Glucose, Bld: 85 mg/dL (ref 70–99)
Potassium: 4.2 mEq/L (ref 3.5–5.1)
Sodium: 139 mEq/L (ref 135–145)

## 2022-03-23 LAB — LDL CHOLESTEROL, DIRECT: Direct LDL: 64 mg/dL

## 2022-03-23 LAB — TSH: TSH: 1.66 u[IU]/mL (ref 0.35–5.50)

## 2022-03-23 MED ORDER — ALBUTEROL SULFATE HFA 108 (90 BASE) MCG/ACT IN AERS: 2.0000 | INHALATION_SPRAY | RESPIRATORY_TRACT | 0 refills | Status: DC | PRN

## 2022-03-23 NOTE — Patient Instructions (Signed)
Schedule your complete physical in 6 months ?We'll notify you of your lab results and make any changes if needed ?Keep up the good work on healthy diet and regular exercise- you can do it!!! ?USE the Albuterol inhaler- 2 puffs every 4 hrs as needed for cough or wheezing ?Call with any questions or concerns ?Stay Safe!!  Stay Healthy!!! ?Happy [name]!!! ?

## 2022-03-23 NOTE — Assessment & Plan Note (Signed)
Chronic problem.  Well controlled on Lisinopril HCTZ 10/12.'5mg'$  daily, Metoprolol '25mg'$ .  Currently asymptomatic.  Will check labs due to diuretic use but no anticipated med changes.  Will follow. ?

## 2022-03-23 NOTE — Progress Notes (Signed)
? ?  Subjective:  ? ? Patient ID: [name], male    DOB: 1946/07/04, 76 y.o.   MRN: [id number] ? ?HPI ?HTN- chronic problem, on Lisinopril HCTZ 10/12.'5mg'$  daily, Metoprolol '25mg'$  daily w/ good control.  No CP, SOB, HAs, visual changes, edema. ? ?Hyperlipidemia- chronic problem, on Simvastatin '40mg'$  daily.  No abd pain, N/V ? ?Cough- sxs started 5-6 days ago.  No fever.  Occasional wheezing.  Cough is intermittently productive.  Denies SOB.  Denies sinus pain/pressure.  No HA. ? ? ?Review of Systems ?For ROS see HPI  ?   ?Objective:  ? Physical Exam ?Vitals reviewed.  ?Constitutional:   ?   General: He is not in acute distress. ?   Appearance: Normal appearance. He is well-developed. He is not ill-appearing.  ?HENT:  ?   Head: Normocephalic and atraumatic.  ?Eyes:  ?   Extraocular Movements: Extraocular movements intact.  ?   Conjunctiva/sclera: Conjunctivae normal.  ?   Pupils: Pupils are equal, round, and reactive to light.  ?Neck:  ?   Thyroid: No thyromegaly.  ?Cardiovascular:  ?   Rate and Rhythm: Normal rate and regular rhythm.  ?   Pulses: Normal pulses.  ?   Heart sounds: Normal heart sounds. No murmur heard. ?Pulmonary:  ?   Effort: Pulmonary effort is normal. No respiratory distress.  ?   Breath sounds: Normal breath sounds.  ?   Comments: + wet cough ?Abdominal:  ?   General: Bowel sounds are normal. There is no distension.  ?   Palpations: Abdomen is soft.  ?Musculoskeletal:  ?   Cervical back: Normal range of motion and neck supple.  ?   Right lower leg: No edema.  ?   Left lower leg: No edema.  ?Lymphadenopathy:  ?   Cervical: No cervical adenopathy.  ?Skin: ?   General: Skin is warm and dry.  ?Neurological:  ?   General: No focal deficit present.  ?   Mental Status: He is alert and oriented to person, place, and time.  ?   Cranial Nerves: No cranial nerve deficit.  ?Psychiatric:     ?   Mood and Affect: Mood normal.     ?   Behavior: Behavior normal.  ? ? ? ? ? ?   ?Assessment & Plan:  ? ?Cough- new.   Pt reports it is improving since onset 5-6 days ago but does note wheezing- particularly when lying down.  Lungs are CTAB today and he is not ill appearing.  Suspect this is a viral/allergy combo and will start Albuterol as needed for cough/wheezing.  Pt expressed understanding and is in agreement w/ plan.  ?

## 2022-03-23 NOTE — Assessment & Plan Note (Signed)
Chronic problem.  Currently on Simvastatin 40mg daily w/o difficulty.  Check labs.  Adjust meds prn  

## 2022-03-27 ENCOUNTER — Telehealth: Payer: Self-pay

## 2022-03-27 NOTE — Telephone Encounter (Signed)
Patient aware of labs.  

## 2022-03-27 NOTE — Telephone Encounter (Signed)
-----   Message from Midge Minium, MD sent at 03/24/2022  9:25 AM EDT ----- ?Labs look good!  Increase your water intake bc your kidney function is just a little low but this is likely due to limited water prior to having your labs done ?

## 2022-04-01 ENCOUNTER — Other Ambulatory Visit: Payer: Self-pay | Admitting: Registered Nurse

## 2022-04-01 ENCOUNTER — Other Ambulatory Visit: Payer: Self-pay | Admitting: Family Medicine

## 2022-04-01 DIAGNOSIS — M1A9XX0 Chronic gout, unspecified, without tophus (tophi): Secondary | ICD-10-CM

## 2022-04-01 DIAGNOSIS — I10 Essential (primary) hypertension: Secondary | ICD-10-CM

## 2022-04-01 DIAGNOSIS — G8929 Other chronic pain: Secondary | ICD-10-CM

## 2022-04-17 ENCOUNTER — Other Ambulatory Visit: Payer: Self-pay | Admitting: Registered Nurse

## 2022-04-17 DIAGNOSIS — M1712 Unilateral primary osteoarthritis, left knee: Secondary | ICD-10-CM

## 2022-04-17 NOTE — Telephone Encounter (Signed)
Defer to you

## 2022-05-03 ENCOUNTER — Other Ambulatory Visit: Payer: Self-pay | Admitting: Family Medicine

## 2022-05-03 DIAGNOSIS — I10 Essential (primary) hypertension: Secondary | ICD-10-CM

## 2022-05-11 ENCOUNTER — Ambulatory Visit (INDEPENDENT_AMBULATORY_CARE_PROVIDER_SITE_OTHER): Payer: Medicare Other | Admitting: *Deleted

## 2022-05-11 DIAGNOSIS — Z0000 Encounter for general adult medical examination without abnormal findings: Secondary | ICD-10-CM

## 2022-05-11 NOTE — Progress Notes (Signed)
Subjective:   [name] is a 76 y.o. male who presents for Medicare Annual/Subsequent preventive examination.  I connected with  [name] on 05/11/22 by a telephone enabled telemedicine application and verified that I am speaking with the correct person using two identifiers.   I discussed the limitations of evaluation and management by telemedicine. The patient expressed understanding and agreed to proceed.  Patient location: home  Provider location: Tele-Health-home    Review of Systems     Cardiac Risk Factors include: advanced age (>61mn, >>5women);male gender;obesity (BMI >30kg/m2);hypertension     Objective:    Today's Vitals   There is no height or weight on file to calculate BMI.     05/11/2022   10:03 AM 03/16/2021    6:54 AM 03/14/2021    8:50 AM 02/21/2021    9:03 AM 01/14/2020    8:28 AM 07/25/2018    2:16 PM 12/06/2017    9:06 AM  Advanced Directives  Does Patient Have a Medical Advance Directive? No No No No No No No  Would patient like information on creating a medical advance directive? No - Patient declined No - Patient declined No - Patient declined No - Patient declined Yes (MAU/Ambulatory/Procedural Areas - Information given) Yes (MAU/Ambulatory/Procedural Areas - Information given)     Current Medications (verified) Outpatient Encounter Medications as of 05/11/2022  Medication Sig   albuterol (VENTOLIN HFA) 108 (90 Base) MCG/ACT inhaler Inhale 2 puffs into the lungs every 4 (four) hours as needed for wheezing or shortness of breath.   allopurinol (ZYLOPRIM) 100 MG tablet TAKE 1 TABLET DAILY   aspirin EC 81 MG tablet Take 81 mg by mouth daily. Swallow whole.   diclofenac (VOLTAREN) 75 MG EC tablet TAKE 1 TABLET 2 TIMES A DAY AS NEEDED   docusate sodium (COLACE) 100 MG capsule Take 1 capsule (100 mg total) by mouth 2 (two) times daily. To prevent constipation while taking pain medication.   EPINEPHrine 0.3 mg/0.3 mL IJ SOAJ injection Inject 0.3  mg into the muscle as needed for anaphylaxis.   gabapentin (NEURONTIN) 300 MG capsule Take 1 capsule (300 mg total) by mouth 2 (two) times daily as needed (pain).   HYDROcodone-acetaminophen (NORCO/VICODIN) 5-325 MG tablet Take 1 tablet by mouth every 6 (six) hours as needed for moderate pain.   lisinopril-hydrochlorothiazide (ZESTORETIC) 10-12.5 MG tablet TAKE 1 TABLET DAILY   metoprolol succinate (TOPROL-XL) 25 MG 24 hr tablet TAKE 1 TABLET DAILY   simvastatin (ZOCOR) 40 MG tablet TAKE 1 TABLET DAILY   No facility-administered encounter medications on file as of 05/11/2022.    Allergies (verified) Bee venom   History: Past Medical History:  Diagnosis Date   Arthritis    Cellulitis and abscess of other specified site    Colon polyps    Gout    Hypertension    Neuromuscular disorder ([place] [place]    arthritis   Scoliosis    Past Surgical History:  Procedure Laterality Date   BACK SURGERY     COLONOSCOPY  01/10/2013   Procedure: COLONOSCOPY;  Surgeon: NRogene Houston MD;  [facility];  Service: Endoscopy;  Laterality: N/A;  730   COLONOSCOPY N/A 08/21/2013   Procedure: COLONOSCOPY;  Surgeon: NRogene Houston MD;  [facility];  Service: Endoscopy;  Laterality: N/A;  1030   COLONOSCOPY N/A 08/26/2015   Procedure: COLONOSCOPY;  Surgeon: NRogene Houston MD;  [facility];  Service: Endoscopy;  Laterality: N/A;  240   COLONOSCOPY WITH PROPOFOL N/A 03/16/2021   Procedure: COLONOSCOPY WITH PROPOFOL;  Surgeon: Rogene Houston, MD;  Location: [facility];  Service: Endoscopy;  Laterality: N/A;  patient knows to arrive at 0630.   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  Tobacco Use   Smoking status: Former    Packs/day: 3.00    Years: 30.00    Pack years: 90.00    Types: Cigarettes    Start date: 12/18/1957    Quit date: 12/18/1996    Years since quitting: 25.4   Smokeless tobacco: Never   Tobacco comments:    started smoking as a child  Vaping Use   Vaping Use: Never used  Substance and Sexual Activity   Alcohol use: Yes    Alcohol/week: 0.0 standard drinks    Comment: beer occasionally   Drug use: No   Sexual activity: Not on file  Other Topics Concern   Not on file  Social History Narrative   2 sons; 62 Grandchildren       Enjoys fishing    Social Determinants of Radio broadcast assistant Strain: Low Risk    Difficulty of Paying Living Expenses: Not hard at all  Food Insecurity: No Food Insecurity   Worried About Charity fundraiser in the Last Year: Never true   Arboriculturist in the Last Year: Never true  Transportation Needs: No Transportation Needs   Lack of Transportation (Medical): No   Lack of Transportation (Non-Medical): No  Physical Activity: Insufficiently Active   Days of Exercise per Week: 3 days   Minutes of Exercise per Session: 40 min  Stress: No Stress Concern Present   Feeling of Stress : Not at all  Social Connections: Moderately Isolated   Frequency of Communication with Friends and Family: More than three times a week   Frequency of Social Gatherings with Friends and Family: Three times a week   Attends Religious Services: Never   Active Member of Clubs or Organizations: No   Attends Music therapist: Never   Marital Status: Married    Tobacco Counseling Counseling given: Not  Answered Tobacco comments: started smoking as a child   Clinical Intake:  Pre-visit preparation completed: Yes  Pain : No/denies pain     Nutritional Risks: None Diabetes: No  How often do you need to have someone help you when you read instructions, pamphlets, or other written materials from your doctor or pharmacy?: 1 - Never  Diabetic?  yes  Interpreter Needed?: No  Information entered by :: Leroy Kennedy LPN   Activities of Daily Living    05/11/2022   10:04 AM  In your present state of health, do you have any difficulty performing the following activities:  Hearing?  0  Vision? 0  Difficulty concentrating or making decisions? 0  Walking or climbing stairs? 0  Dressing or bathing? 0  Doing errands, shopping? 0  Preparing Food and eating ? N  Using the Toilet? N  In the past six months, have you accidently leaked urine? N  Do you have problems with loss of bowel control? N  Managing your Medications? N  Managing your Finances? N  Housekeeping or managing your Housekeeping? N    Patient Care Team: Midge Minium, MD as PCP - General (Family Medicine) Jovita Gamma, MD as Consulting Physician (Neurosurgery) Herminio Commons, MD (Inactive) as Attending Physician (Cardiology) Rogene Houston, MD as Consulting Physician (Gastroenterology) [name], [facility] as Pharmacist (Pharmacist)  Indicate any recent Medical Services you may have received from other than [facility] providers in the past year (date may be approximate).     Assessment:   This is a routine wellness examination for [name].  Hearing/Vision screen Hearing Screening - Comments:: No trouble hearing Vision Screening - Comments:: Up to date [facility]   Dietary issues and exercise activities discussed: Current Exercise Habits: Home exercise routine, Type of exercise: walking, Time (Minutes): 40, Frequency (Times/Week): 3, Weekly Exercise (Minutes/Week): 120, Intensity: Mild, Exercise limited by:  None identified   Goals Addressed             This Visit's Progress    Patient Stated       Maintain current lifestyle       Depression Screen    05/11/2022   10:06 AM 03/23/2022    9:25 AM 07/26/2021    8:40 AM 02/21/2021    9:06 AM 01/17/2021    8:30 AM 07/19/2020    9:55 AM 01/14/2020    8:29 AM  PHQ 2/9 Scores  PHQ - 2 Score 0 0 0 0 0 0 0  PHQ- 9 Score 2 2   0 0     Fall Risk    03/23/2022    9:26 AM 07/26/2021    8:40 AM 02/21/2021    9:06 AM 01/17/2021    8:30 AM 07/19/2020    9:55 AM  Fall Risk   Falls in the past year? 0 0 0 0 0  Number falls in past yr:  0 0 0 0  Injury with Fall?  0 0 0 0  Risk for fall due to : No Fall Risks No Fall Risks  No Fall Risks   Follow up Falls evaluation completed Falls evaluation completed Falls prevention discussed  Falls evaluation completed    FALL RISK PREVENTION PERTAINING TO THE HOME:  Any stairs in or around the home? No  If so, are there any without handrails? No  Home free of loose throw rugs in walkways, pet beds, electrical cords, etc? Yes  Adequate lighting in your home to reduce risk of falls? Yes   ASSISTIVE DEVICES UTILIZED TO PREVENT FALLS:  Life alert? No  Use of a cane, walker or w/c? No  Grab bars in the bathroom? No  Shower chair or bench in shower? No  Elevated toilet seat or a handicapped toilet? No   TIMED UP AND GO:  Was the test performed? No .    Cognitive Function:    07/25/2018    2:18 PM  MMSE - Mini Mental State Exam  Orientation to time 5  Orientation to Place 5  Registration 3  Attention/ Calculation 3  Recall 1  Language- name 2 objects 2  Language- repeat 1  Language- follow 3 step command 3  Language- read & follow direction 1  Write a sentence 1  Copy design 1  Total score 26        05/11/2022   10:02 AM 01/14/2020    8:29 AM  6CIT Screen  What Year? 0 points 0 points  What month? 0 points 0 points  What time? 0 points 0 points  Count back from 20 0 points 0 points  Months  in reverse 2 points 0 points  Repeat phrase 0 points 0 points  Total Score 2 points 0 points    Immunizations Immunization History  Administered Date(s) Administered   Moderna Sars-Covid-2 Vaccination 02/19/2020, 03/23/2020   Pneumococcal Conjugate-13 07/18/2017   Pneumococcal Polysaccharide-23 12/30/2018   Tdap 07/30/2007   Zoster Recombinat (Shingrix) 07/26/2021   Zoster, Live 01/19/2015    TDAP status: Due, Education has been provided regarding the importance of this vaccine. Advised may receive this vaccine at local pharmacy or Health Dept. Aware to provide a copy of the vaccination record if obtained from local pharmacy or Health Dept. Verbalized acceptance and understanding.  Flu Vaccine status: Declined, Education has been provided regarding the importance of this vaccine but patient still declined. Advised may receive this vaccine at local pharmacy or Health Dept. Aware to provide a copy of the vaccination record if obtained from local pharmacy or Health Dept. Verbalized acceptance and understanding.  Pneumococcal vaccine status: Up to date  Covid-19 vaccine status: Information provided on how to obtain vaccines.   Qualifies for Shingles Vaccine? Yes   Zostavax completed No   Shingrix Completed?: No.    Education has been provided regarding the importance of this vaccine. Patient has been advised to call insurance company to determine out of pocket expense if they have not yet received this vaccine. Advised may also receive vaccine at local pharmacy or Health Dept. Verbalized acceptance and understanding.  Screening Tests Health Maintenance  Topic Date Due   COVID-19 Vaccine (3 - Booster for Moderna series) 05/27/2022 (Originally 05/18/2020)   TETANUS/TDAP  07/26/2022 (Originally 07/29/2017)   Hepatitis C Screening  07/26/2022 (Originally 11/29/1964)   INFLUENZA VACCINE  07/18/2022   COLONOSCOPY (Pts 45-79yr Insurance coverage will need to be confirmed)  03/16/2026    Pneumonia Vaccine 76 Years old  Completed   HPV VACCINES  Aged Out   Zoster Vaccines- Shingrix  Discontinued    Health Maintenance  There are no preventive care reminders to display for this patient.   Colorectal cancer screening: Type of screening: Colonoscopy. Completed 2022. Repeat every 5 years  Lung Cancer Screening: (Low Dose CT Chest recommended if Age 74-80years, 30 pack-year currently smoking OR have quit w/in 15years.) does not qualify.   Lung Cancer Screening Referral:   Additional Screening:  Hepatitis C Screening: does not qualify; Completed 2022  Vision Screening: Recommended annual ophthalmology exams for early detection of glaucoma and other disorders of the eye. Is the patient up to date with their annual eye exam?  Yes  Who is the provider or what is the name of the office in which the patient attends annual eye exams? [facility] If pt is not established with a provider, would they like to be referred to a provider to establish care? No .   Dental Screening: Recommended annual dental exams for proper oral hygiene  Community Resource Referral / Chronic Care Management: CRR required this visit?  No   CCM required this visit?  No      Plan:  I have personally reviewed and noted the following in the patient's chart:   Medical and social history Use of alcohol, tobacco or illicit drugs  Current medications and supplements including opioid prescriptions. Patient is not currently taking opioid prescriptions. Functional ability and status Nutritional status Physical activity Advanced directives List of other physicians Hospitalizations, surgeries, and ER visits in previous 12 months Vitals Screenings to include cognitive, depression, and falls Referrals and appointments  In addition, I have reviewed and discussed with patient certain preventive protocols, quality metrics, and best practice recommendations. A written personalized care plan for preventive  services as well as general preventive health recommendations were provided to patient.     Leroy Kennedy, LPN   [id number]/[id number]   Nurse Notes:

## 2022-05-11 NOTE — Patient Instructions (Signed)
[name] , Thank you for taking time to come for your Medicare Wellness Visit. I appreciate your ongoing commitment to your health goals. Please review the following plan we discussed and let me know if I can assist you in the future.   Screening recommendations/referrals: Colonoscopy: up to date Recommended yearly ophthalmology/optometry visit for glaucoma screening and checkup Recommended yearly dental visit for hygiene and checkup  Vaccinations: Influenza vaccine: Education provided Pneumococcal vaccine: up to date Tdap vaccine: Education provided Shingles vaccine: 1 of 2    Advanced directives: Education provided  Conditions/risks identified:     Preventive Care 74 Years and Older, Male Preventive care refers to lifestyle choices and visits with your health care provider that can promote health and wellness. What does preventive care include? A yearly physical exam. This is also called an annual well check. Dental exams once or twice a year. Routine eye exams. Ask your health care provider how often you should have your eyes checked. Personal lifestyle choices, including: Daily care of your teeth and gums. Regular physical activity. Eating a healthy diet. Avoiding tobacco and drug use. Limiting alcohol use. Practicing safe sex. Taking low doses of aspirin every day. Taking vitamin and mineral supplements as recommended by your health care provider. What happens during an annual well check? The services and screenings done by your health care provider during your annual well check will depend on your age, overall health, lifestyle risk factors, and family history of disease. Counseling  Your health care provider may ask you questions about your: Alcohol use. Tobacco use. Drug use. Emotional well-being. Home and relationship well-being. Sexual activity. Eating habits. History of falls. Memory and ability to understand (cognition). Work and work  Statistician. Screening  You may have the following tests or measurements: Height, weight, and BMI. Blood pressure. Lipid and cholesterol levels. These may be checked every 5 years, or more frequently if you are over 44 years old. Skin check. Lung cancer screening. You may have this screening every year starting at age 59 if you have a 30-pack-year history of smoking and currently smoke or have quit within the past 15 years. Fecal occult blood test (FOBT) of the stool. You may have this test every year starting at age 50. Flexible sigmoidoscopy or colonoscopy. You may have a sigmoidoscopy every 5 years or a colonoscopy every 10 years starting at age 41. Prostate cancer screening. Recommendations will vary depending on your family history and other risks. Hepatitis C blood test. Hepatitis B blood test. Sexually transmitted disease (STD) testing. Diabetes screening. This is done by checking your blood sugar (glucose) after you have not eaten for a while (fasting). You may have this done every 1-3 years. Abdominal aortic aneurysm (AAA) screening. You may need this if you are a current or former smoker. Osteoporosis. You may be screened starting at age 67 if you are at high risk. Talk with your health care provider about your test results, treatment options, and if necessary, the need for more tests. Vaccines  Your health care provider may recommend certain vaccines, such as: Influenza vaccine. This is recommended every year. Tetanus, diphtheria, and acellular pertussis (Tdap, Td) vaccine. You may need a Td booster every 10 years. Zoster vaccine. You may need this after age 29. Pneumococcal 13-valent conjugate (PCV13) vaccine. One dose is recommended after age 15. Pneumococcal polysaccharide (PPSV23) vaccine. One dose is recommended after age 87. Talk to your health care provider about which screenings and vaccines you need and how often you  need them. This information is not intended to replace  advice given to you by your health care provider. Make sure you discuss any questions you have with your health care provider. Document Released: 12/31/2015 Document Revised: 08/23/2016 Document Reviewed: 10/05/2015 Elsevier Interactive Patient Education  2017 [place] [employer] Prevention in the Home Falls can cause injuries. They can happen to people of all ages. There are many things you can do to make your home safe and to help prevent falls. What can I do on the outside of my home? Regularly fix the edges of walkways and driveways and fix any cracks. Remove anything that might make you trip as you walk through a door, such as a raised step or threshold. Trim any bushes or trees on the path to your home. Use bright outdoor lighting. Clear any walking paths of anything that might make someone trip, such as rocks or tools. Regularly check to see if handrails are loose or broken. Make sure that both sides of any steps have handrails. Any raised decks and porches should have guardrails on the edges. Have any leaves, snow, or ice cleared regularly. Use sand or salt on walking paths during winter. Clean up any spills in your garage right away. This includes oil or grease spills. What can I do in the bathroom? Use night lights. Install grab bars by the toilet and in the tub and shower. Do not use towel bars as grab bars. Use non-skid mats or decals in the tub or shower. If you need to sit down in the shower, use a plastic, non-slip stool. Keep the floor dry. Clean up any water that spills on the floor as soon as it happens. Remove soap buildup in the tub or shower regularly. Attach bath mats securely with double-sided non-slip rug tape. Do not have throw rugs and other things on the floor that can make you trip. What can I do in the bedroom? Use night lights. Make sure that you have a light by your bed that is easy to reach. Do not use any sheets or blankets that are too big for your bed.  They should not hang down onto the floor. Have a firm chair that has side arms. You can use this for support while you get dressed. Do not have throw rugs and other things on the floor that can make you trip. What can I do in the kitchen? Clean up any spills right away. Avoid walking on wet floors. Keep items that you use a lot in easy-to-reach places. If you need to reach something above you, use a strong step stool that has a grab bar. Keep electrical cords out of the way. Do not use floor polish or wax that makes floors slippery. If you must use wax, use non-skid floor wax. Do not have throw rugs and other things on the floor that can make you trip. What can I do with my stairs? Do not leave any items on the stairs. Make sure that there are handrails on both sides of the stairs and use them. Fix handrails that are broken or loose. Make sure that handrails are as long as the stairways. Check any carpeting to make sure that it is firmly attached to the stairs. Fix any carpet that is loose or worn. Avoid having throw rugs at the top or bottom of the stairs. If you do have throw rugs, attach them to the floor with carpet tape. Make sure that you have a light switch  at the top of the stairs and the bottom of the stairs. If you do not have them, ask someone to add them for you. What else can I do to help prevent falls? Wear shoes that: Do not have high heels. Have rubber bottoms. Are comfortable and fit you well. Are closed at the toe. Do not wear sandals. If you use a stepladder: Make sure that it is fully opened. Do not climb a closed stepladder. Make sure that both sides of the stepladder are locked into place. Ask someone to hold it for you, if possible. Clearly mark and make sure that you can see: Any grab bars or handrails. First and last steps. Where the edge of each step is. Use tools that help you move around (mobility aids) if they are needed. These  include: Canes. Walkers. Scooters. Crutches. Turn on the lights when you go into a dark area. Replace any light bulbs as soon as they burn out. Set up your furniture so you have a clear path. Avoid moving your furniture around. If any of your floors are uneven, fix them. If there are any pets around you, be aware of where they are. Review your medicines with your doctor. Some medicines can make you feel dizzy. This can increase your chance of falling. Ask your doctor what other things that you can do to help prevent falls. This information is not intended to replace advice given to you by your health care provider. Make sure you discuss any questions you have with your health care provider. Document Released: 09/30/2009 Document Revised: 05/11/2016 Document Reviewed: 01/08/2015 Elsevier Interactive Patient Education  2017 [employer].

## 2022-06-02 ENCOUNTER — Other Ambulatory Visit: Payer: Self-pay | Admitting: Family Medicine

## 2022-06-02 DIAGNOSIS — I10 Essential (primary) hypertension: Secondary | ICD-10-CM

## 2022-06-07 ENCOUNTER — Telehealth: Payer: Medicare Other

## 2022-06-13 ENCOUNTER — Other Ambulatory Visit: Payer: Self-pay | Admitting: Family Medicine

## 2022-06-13 DIAGNOSIS — G8929 Other chronic pain: Secondary | ICD-10-CM

## 2022-06-27 ENCOUNTER — Other Ambulatory Visit: Payer: Self-pay | Admitting: Family Medicine

## 2022-06-27 DIAGNOSIS — I10 Essential (primary) hypertension: Secondary | ICD-10-CM

## 2022-06-27 DIAGNOSIS — M1A9XX0 Chronic gout, unspecified, without tophus (tophi): Secondary | ICD-10-CM

## 2022-07-31 ENCOUNTER — Other Ambulatory Visit: Payer: Self-pay | Admitting: Family Medicine

## 2022-07-31 DIAGNOSIS — I10 Essential (primary) hypertension: Secondary | ICD-10-CM

## 2022-08-01 ENCOUNTER — Telehealth: Payer: Self-pay | Admitting: Family Medicine

## 2022-08-01 DIAGNOSIS — G8929 Other chronic pain: Secondary | ICD-10-CM

## 2022-08-01 MED ORDER — GABAPENTIN 300 MG PO CAPS: 300.0000 mg | ORAL_CAPSULE | Freq: Two times a day (BID) | ORAL | 1 refills | Status: DC | PRN

## 2022-08-01 NOTE — Telephone Encounter (Signed)
Encourage patient to contact the pharmacy for refills or they can request refills through [facility]  (Please schedule appointment if patient has not been seen in over a year)    WHAT [place] TO: [facility] [phone]  MEDICATION NAME & DOSE: gabapentin 300 mg  NOTES/COMMENTS FROM PATIENT:      [place] office please notify patient: It takes 48-72 hours to process rx refill requests Ask patient to call pharmacy to ensure rx is ready before heading there.

## 2022-08-01 NOTE — Telephone Encounter (Signed)
Prescription sent to pharmacy.

## 2022-08-14 ENCOUNTER — Other Ambulatory Visit: Payer: Self-pay | Admitting: Family Medicine

## 2022-08-14 DIAGNOSIS — G8929 Other chronic pain: Secondary | ICD-10-CM

## 2022-08-23 DIAGNOSIS — M5416 Radiculopathy, lumbar region: Secondary | ICD-10-CM | POA: Diagnosis not present

## 2022-08-28 DIAGNOSIS — M25512 Pain in left shoulder: Secondary | ICD-10-CM | POA: Diagnosis not present

## 2022-08-28 DIAGNOSIS — M25511 Pain in right shoulder: Secondary | ICD-10-CM | POA: Diagnosis not present

## 2022-09-01 ENCOUNTER — Other Ambulatory Visit: Payer: Self-pay | Admitting: Family Medicine

## 2022-09-01 DIAGNOSIS — I10 Essential (primary) hypertension: Secondary | ICD-10-CM

## 2022-09-20 ENCOUNTER — Other Ambulatory Visit: Payer: Self-pay

## 2022-09-20 ENCOUNTER — Ambulatory Visit (INDEPENDENT_AMBULATORY_CARE_PROVIDER_SITE_OTHER): Payer: Medicare Other | Admitting: Family Medicine

## 2022-09-20 ENCOUNTER — Encounter: Payer: Self-pay | Admitting: Family Medicine

## 2022-09-20 VITALS — BP 122/58 | HR 61 | Temp 97.7°F | Ht 69.5 in | Wt 215.8 lb

## 2022-09-20 DIAGNOSIS — Z125 Encounter for screening for malignant neoplasm of prostate: Secondary | ICD-10-CM | POA: Diagnosis not present

## 2022-09-20 DIAGNOSIS — Z1159 Encounter for screening for other viral diseases: Secondary | ICD-10-CM

## 2022-09-20 DIAGNOSIS — R748 Abnormal levels of other serum enzymes: Secondary | ICD-10-CM

## 2022-09-20 DIAGNOSIS — I10 Essential (primary) hypertension: Secondary | ICD-10-CM | POA: Diagnosis not present

## 2022-09-20 DIAGNOSIS — Z0000 Encounter for general adult medical examination without abnormal findings: Secondary | ICD-10-CM

## 2022-09-20 LAB — CBC WITH DIFFERENTIAL/PLATELET
Basophils Absolute: 0 10*3/uL (ref 0.0–0.1)
Basophils Relative: 0.5 % (ref 0.0–3.0)
Eosinophils Absolute: 0.2 10*3/uL (ref 0.0–0.7)
Eosinophils Relative: 2.7 % (ref 0.0–5.0)
HCT: 41 % (ref 39.0–52.0)
Hemoglobin: 13.6 g/dL (ref 13.0–17.0)
Lymphocytes Relative: 20.8 % (ref 12.0–46.0)
Lymphs Abs: 1.2 10*3/uL (ref 0.7–4.0)
MCHC: 33.1 g/dL (ref 30.0–36.0)
MCV: 88.3 fl (ref 78.0–100.0)
Monocytes Absolute: 0.6 10*3/uL (ref 0.1–1.0)
Monocytes Relative: 10.7 % (ref 3.0–12.0)
Neutro Abs: 3.7 10*3/uL (ref 1.4–7.7)
Neutrophils Relative %: 65.3 % (ref 43.0–77.0)
Platelets: 179 10*3/uL (ref 150.0–400.0)
RBC: 4.65 Mil/uL (ref 4.22–5.81)
RDW: 14.4 % (ref 11.5–15.5)
WBC: 5.7 10*3/uL (ref 4.0–10.5)

## 2022-09-20 LAB — LIPID PANEL
Cholesterol: 129 mg/dL (ref 0–200)
HDL: 32.6 mg/dL — ABNORMAL LOW (ref >39.00)
NonHDL: 96.23
Total CHOL/HDL Ratio: 4
Triglycerides: 233 mg/dL — ABNORMAL HIGH (ref 0.0–149.0)
VLDL: 46.6 mg/dL — ABNORMAL HIGH (ref 0.0–40.0)

## 2022-09-20 LAB — BASIC METABOLIC PANEL
BUN: 28 mg/dL — ABNORMAL HIGH (ref 6–23)
CO2: 28 mEq/L (ref 19–32)
Calcium: 9.6 mg/dL (ref 8.4–10.5)
Chloride: 104 mEq/L (ref 96–112)
Creatinine, Ser: 1.57 mg/dL — ABNORMAL HIGH (ref 0.40–1.50)
GFR: 43.36 mL/min — ABNORMAL LOW (ref >60.00)
Glucose, Bld: 79 mg/dL (ref 70–99)
Potassium: 4.4 mEq/L (ref 3.5–5.1)
Sodium: 140 mEq/L (ref 135–145)

## 2022-09-20 LAB — HEPATIC FUNCTION PANEL
ALT: 21 U/L (ref 0–53)
AST: 15 U/L (ref 0–37)
Albumin: 4.2 g/dL (ref 3.5–5.2)
Alkaline Phosphatase: 78 U/L (ref 39–117)
Bilirubin, Direct: 0.2 mg/dL (ref 0.0–0.3)
Total Bilirubin: 0.7 mg/dL (ref 0.2–1.2)
Total Protein: 6.2 g/dL (ref 6.0–8.3)

## 2022-09-20 LAB — PSA, MEDICARE: PSA: 0.42 ng/ml (ref 0.10–4.00)

## 2022-09-20 LAB — TSH: TSH: 1.93 u[IU]/mL (ref 0.35–5.50)

## 2022-09-20 LAB — LDL CHOLESTEROL, DIRECT: Direct LDL: 69 mg/dL

## 2022-09-20 NOTE — Assessment & Plan Note (Signed)
Pt's PE WNL w/ exception of BMI.  UTD on colonoscopy.  Declines flu.  Check labs.  Anticipatory guidance provided.

## 2022-09-20 NOTE — Progress Notes (Signed)
Informed pt of lab results . Coming in on 09/26/22 for lab only visit to repeat BMP lab is in

## 2022-09-20 NOTE — Assessment & Plan Note (Signed)
Chronic problem.  Currently well controlled.  Asymptomatic.  Check labs due to ACE and diuretic.  No anticipated med changes

## 2022-09-20 NOTE — Progress Notes (Signed)
   Subjective:    Patient ID: [name], male    DOB: [birth year], 76 y.o.   MRN: [id number]  HPI CPE- UTD on colonoscopy.  Declines flu and Tdap.  No concerns today  Patient Care Team    Relationship Specialty Notifications Start End  Midge Minium, MD PCP - General Family Medicine  01/19/17   Jovita Gamma, MD Consulting Physician Neurosurgery  01/19/17   Herminio Commons, MD (Inactive) Attending Physician Cardiology  01/19/17   Rogene Houston, MD Consulting Physician Gastroenterology  01/19/17   [name], [facility] (Inactive) Pharmacist Pharmacist  04/09/20    Comment: PHONE NUMBER [phone]    Health Maintenance  Topic Date Due   Hepatitis C Screening  Never done   COVID-19 Vaccine (3 - Moderna series) 10/06/2022 (Originally 05/18/2020)   INFLUENZA VACCINE  03/18/2023 (Originally 07/18/2022)   TETANUS/TDAP  09/21/2023 (Originally 07/29/2017)   COLONOSCOPY (Pts 45-41yr Insurance coverage will need to be confirmed)  03/16/2026   Pneumonia Vaccine 76 Years old  Completed   HPV VACCINES  Aged Out   Zoster Vaccines- Shingrix  Discontinued      Review of Systems Patient reports no vision/hearing changes, anorexia, fever ,adenopathy, persistant/recurrent hoarseness, swallowing issues, chest pain, palpitations, edema, persistant/recurrent cough, hemoptysis, dyspnea (rest,exertional, paroxysmal nocturnal), gastrointestinal  bleeding (melena, rectal bleeding), abdominal pain, excessive heart burn, GU symptoms (dysuria, hematuria, voiding/incontinence issues) syncope, focal weakness, memory loss, numbness & tingling, skin/hair/nail changes, depression, anxiety, abnormal bruising/bleeding, musculoskeletal symptoms/signs.     Objective:   Physical Exam General Appearance:    Alert, cooperative, no distress, appears stated age  Head:    Normocephalic, without obvious abnormality, atraumatic  Eyes:    PERRL, conjunctiva/corneas clear, EOM's intact both eyes       Ears:    Normal  TM's and external ear canals, both ears  Nose:   Nares normal, septum midline, mucosa normal, no drainage   or sinus tenderness  Throat:   Lips, mucosa, and tongue normal; teeth and gums normal  Neck:   Supple, symmetrical, trachea midline, no adenopathy;       thyroid:  No enlargement/tenderness/nodules  Back:     Symmetric, no curvature, ROM normal, no CVA tenderness  Lungs:     Clear to auscultation bilaterally, respirations unlabored  Chest wall:    No tenderness or deformity  Heart:    Regular rate and rhythm, S1 and S2 normal, no murmur, rub   or gallop  Abdomen:     Soft, non-tender, bowel sounds active all four quadrants,    no masses, no organomegaly  Genitalia:    deferred  Rectal:    Extremities:   Extremities normal, atraumatic, no cyanosis or edema  Pulses:   2+ and symmetric all extremities  Skin:   Skin color, texture, turgor normal, no rashes or lesions  Lymph nodes:   Cervical, supraclavicular, and axillary nodes normal  Neurologic:   CNII-XII intact. Normal strength, sensation and reflexes      throughout          Assessment & Plan:

## 2022-09-20 NOTE — Patient Instructions (Signed)
Follow up in 6 months to recheck BP and cholesterol We'll notify you of your lab results and make any changes if needed Keep up the good work on healthy diet and regular exercise- you're doing great! Call with any questions or concerns Stay Safe!!  Stay Healthy! Happy Fall!!!

## 2022-09-21 LAB — HEPATITIS C ANTIBODY: Hepatitis C Ab: NONREACTIVE

## 2022-09-26 ENCOUNTER — Other Ambulatory Visit (INDEPENDENT_AMBULATORY_CARE_PROVIDER_SITE_OTHER): Payer: Medicare Other

## 2022-09-26 DIAGNOSIS — R748 Abnormal levels of other serum enzymes: Secondary | ICD-10-CM

## 2022-09-26 LAB — BASIC METABOLIC PANEL
BUN: 19 mg/dL (ref 6–23)
CO2: 27 mEq/L (ref 19–32)
Calcium: 9.5 mg/dL (ref 8.4–10.5)
Chloride: 102 mEq/L (ref 96–112)
Creatinine, Ser: 1.23 mg/dL (ref 0.40–1.50)
GFR: 58.11 mL/min — ABNORMAL LOW (ref >60.00)
Glucose, Bld: 116 mg/dL — ABNORMAL HIGH (ref 70–99)
Potassium: 3.8 mEq/L (ref 3.5–5.1)
Sodium: 139 mEq/L (ref 135–145)

## 2022-09-26 NOTE — Progress Notes (Signed)
Informed pf of lab results  

## 2022-09-27 ENCOUNTER — Other Ambulatory Visit: Payer: Self-pay | Admitting: Family Medicine

## 2022-09-27 DIAGNOSIS — M1A9XX0 Chronic gout, unspecified, without tophus (tophi): Secondary | ICD-10-CM

## 2022-09-27 DIAGNOSIS — I10 Essential (primary) hypertension: Secondary | ICD-10-CM

## 2022-10-17 ENCOUNTER — Other Ambulatory Visit: Payer: Self-pay | Admitting: Family Medicine

## 2022-10-17 DIAGNOSIS — G8929 Other chronic pain: Secondary | ICD-10-CM

## 2022-10-17 NOTE — Telephone Encounter (Signed)
Patient is requesting a refill of the following medications: Requested Prescriptions   Pending Prescriptions Disp Refills   diclofenac (VOLTAREN) 75 MG EC tablet [Pharmacy Med Name: diclofenac sodium 75 mg tablet,delayed release] 60 tablet 0    Sig: TAKE 1 TABLET 2 TIMES A DAY AS NEEDED    Date of patient request: 10/17/22 Last office visit: 09/20/22 Date of last refill: 08/14/22 Last refill amount: 60

## 2022-10-24 DIAGNOSIS — M25552 Pain in left hip: Secondary | ICD-10-CM | POA: Diagnosis not present

## 2022-10-24 DIAGNOSIS — M5442 Lumbago with sciatica, left side: Secondary | ICD-10-CM | POA: Diagnosis not present

## 2022-11-14 ENCOUNTER — Other Ambulatory Visit: Payer: Self-pay | Admitting: Family Medicine

## 2022-11-14 DIAGNOSIS — M1712 Unilateral primary osteoarthritis, left knee: Secondary | ICD-10-CM

## 2022-12-01 ENCOUNTER — Telehealth: Payer: Self-pay | Admitting: Family Medicine

## 2022-12-01 NOTE — Telephone Encounter (Signed)
Caller name: THI SISEMORE  On DPR?: Yes  Call back number: [phone] (mobile)  Provider they see: Midge Minium, MD  Reason for call: Patient states that he has a bad cough at night. Patient wants to know what he can take for this cough. Patient also stated that he has congestion in his chest as well.

## 2022-12-01 NOTE — Telephone Encounter (Signed)
I spoke to the pt and advised that he can try some OTC Musinex DM , Robitussin or Dysleum

## 2022-12-15 ENCOUNTER — Other Ambulatory Visit: Payer: Self-pay | Admitting: Family Medicine

## 2022-12-15 DIAGNOSIS — R0981 Nasal congestion: Secondary | ICD-10-CM | POA: Diagnosis not present

## 2022-12-15 DIAGNOSIS — I10 Essential (primary) hypertension: Secondary | ICD-10-CM | POA: Diagnosis not present

## 2022-12-15 DIAGNOSIS — G8929 Other chronic pain: Secondary | ICD-10-CM

## 2022-12-15 DIAGNOSIS — U071 COVID-19: Secondary | ICD-10-CM | POA: Diagnosis not present

## 2022-12-15 DIAGNOSIS — Z6829 Body mass index (BMI) 29.0-29.9, adult: Secondary | ICD-10-CM | POA: Diagnosis not present

## 2022-12-15 NOTE — Telephone Encounter (Signed)
Patient is requesting a refill of the following medications: Requested Prescriptions   Pending Prescriptions Disp Refills   diclofenac (VOLTAREN) 75 MG EC tablet [Pharmacy Med Name: diclofenac sodium 75 mg tablet,delayed release] 60 tablet 0    Sig: TAKE 1 TABLET 2 TIMES A DAY AS NEEDED    Date of patient request: 12/15/22 Last office visit: 09/20/22 Date of last refill: 09/19/22 Last refill amount: 60

## 2022-12-15 NOTE — Telephone Encounter (Signed)
Refill request received for Voltaren in PCPs absence.  I see that he had a slightly elevated creatinine in October, improved on repeat.  Did have physical October.  Temporary refill provided, but discuss further refills with PCP.

## 2022-12-26 ENCOUNTER — Other Ambulatory Visit: Payer: Self-pay | Admitting: Family Medicine

## 2022-12-26 DIAGNOSIS — I10 Essential (primary) hypertension: Secondary | ICD-10-CM

## 2022-12-26 DIAGNOSIS — M1A9XX0 Chronic gout, unspecified, without tophus (tophi): Secondary | ICD-10-CM

## 2023-01-25 ENCOUNTER — Encounter: Payer: Self-pay | Admitting: Family Medicine

## 2023-01-25 ENCOUNTER — Ambulatory Visit (INDEPENDENT_AMBULATORY_CARE_PROVIDER_SITE_OTHER): Payer: Medicare Other | Admitting: Family Medicine

## 2023-01-25 VITALS — BP 116/78 | HR 69 | Temp 98.1°F | Resp 16 | Ht 69.5 in | Wt 216.0 lb

## 2023-01-25 DIAGNOSIS — R141 Gas pain: Secondary | ICD-10-CM | POA: Diagnosis not present

## 2023-01-25 DIAGNOSIS — R140 Abdominal distension (gaseous): Secondary | ICD-10-CM | POA: Diagnosis not present

## 2023-01-25 DIAGNOSIS — R197 Diarrhea, unspecified: Secondary | ICD-10-CM

## 2023-01-25 NOTE — Patient Instructions (Signed)
Follow up as needed or as scheduled We'll notify you of your lab results and make any changes if needed Continue to drink LOTS of water daily Make sure you are eating regularly START a daily OTC Probiotic (store brand generic is just as good, no particular brand) Call with any questions or concerns- especially if symptoms aren't improving in the next week Hang in there!!!

## 2023-01-25 NOTE — Progress Notes (Signed)
   Subjective:    Patient ID: [name], male    DOB: 09/15/1946, 77 y.o.   MRN: [id number]  HPI Abd issues- 'i feel good everywhere but my stomach'.  Had COVID after Christmas, tx'd w/ antiviral medication (Molnupiravir).  Pt reports stomach will 'bubble and rumble'.  If he goes to the bathroom he will have a lot of gas and 'mushy stuff'.  Has feelings of needing to go frequently.  'if I don't get to the [facility] soon enough, it bubbles back up in me and goes away'.  No N/V.  Appetite is unchanged.  Stomach feels bloated.  Pt reports sxs have been going on x2 weeks.  No blood in stools.  No formed stools recently.  Pt initially thought he had a virus.   Review of Systems For ROS see HPI     Objective:   Physical Exam Vitals reviewed.  Constitutional:      General: He is not in acute distress.    Appearance: He is well-developed. He is not ill-appearing.  HENT:     Head: Normocephalic and atraumatic.  Cardiovascular:     Rate and Rhythm: Normal rate and regular rhythm.  Pulmonary:     Effort: Pulmonary effort is normal. No respiratory distress.     Breath sounds: Normal breath sounds.  Abdominal:     General: Bowel sounds are normal. There is distension.     Palpations: Abdomen is soft. There is no fluid wave or mass.     Tenderness: There is no abdominal tenderness.  Skin:    General: Skin is warm and dry.  Neurological:     General: No focal deficit present.     Mental Status: He is alert.  Psychiatric:        Mood and Affect: Mood normal.        Behavior: Behavior normal.           Assessment & Plan:  Abd bloating, gas, diarrhea- new.  Pt reports he feels well but is having a lot of trouble w/ gas, bloating, and 'mushy' stools.  This started shortly after he finished tx for COVID.  Discussed that illness and medication can disrupt the normal gut flora and that starting a daily probiotic would be helpful.  Will also check labs to r/o infection or electrolyte  disturbance.  If sxs persist over the next week despite daily probiotic use, will need GI referral.  Pt expressed understanding and is in agreement w/ plan.

## 2023-01-26 LAB — CBC WITH DIFFERENTIAL/PLATELET
Basophils Absolute: 0 10*3/uL (ref 0.0–0.1)
Basophils Relative: 0.5 % (ref 0.0–3.0)
Eosinophils Absolute: 0.2 10*3/uL (ref 0.0–0.7)
Eosinophils Relative: 2.9 % (ref 0.0–5.0)
HCT: 44.1 % (ref 39.0–52.0)
Hemoglobin: 14.5 g/dL (ref 13.0–17.0)
Lymphocytes Relative: 20.4 % (ref 12.0–46.0)
Lymphs Abs: 1.5 10*3/uL (ref 0.7–4.0)
MCHC: 32.9 g/dL (ref 30.0–36.0)
MCV: 87 fl (ref 78.0–100.0)
Monocytes Absolute: 0.8 10*3/uL (ref 0.1–1.0)
Monocytes Relative: 10 % (ref 3.0–12.0)
Neutro Abs: 4.9 10*3/uL (ref 1.4–7.7)
Neutrophils Relative %: 66.2 % (ref 43.0–77.0)
Platelets: 223 10*3/uL (ref 150.0–400.0)
RBC: 5.07 Mil/uL (ref 4.22–5.81)
RDW: 14 % (ref 11.5–15.5)
WBC: 7.5 10*3/uL (ref 4.0–10.5)

## 2023-01-26 LAB — HEPATIC FUNCTION PANEL
ALT: 16 U/L (ref 0–53)
AST: 13 U/L (ref 0–37)
Albumin: 4.1 g/dL (ref 3.5–5.2)
Alkaline Phosphatase: 98 U/L (ref 39–117)
Bilirubin, Direct: 0.2 mg/dL (ref 0.0–0.3)
Total Bilirubin: 0.6 mg/dL (ref 0.2–1.2)
Total Protein: 6.6 g/dL (ref 6.0–8.3)

## 2023-01-26 LAB — BASIC METABOLIC PANEL
BUN: 17 mg/dL (ref 6–23)
CO2: 27 mEq/L (ref 19–32)
Calcium: 9.2 mg/dL (ref 8.4–10.5)
Chloride: 101 mEq/L (ref 96–112)
Creatinine, Ser: 1.27 mg/dL (ref 0.40–1.50)
GFR: 55.79 mL/min — ABNORMAL LOW (ref >60.00)
Glucose, Bld: 89 mg/dL (ref 70–99)
Potassium: 3.7 mEq/L (ref 3.5–5.1)
Sodium: 141 mEq/L (ref 135–145)

## 2023-01-29 ENCOUNTER — Telehealth: Payer: Self-pay

## 2023-01-29 NOTE — Telephone Encounter (Signed)
-----   Message from Midge Minium, MD sent at 01/29/2023  7:38 AM EST ----- Labs look great!  No changes at this time

## 2023-01-29 NOTE — Telephone Encounter (Signed)
Informed pt of lab results  

## 2023-03-12 ENCOUNTER — Encounter: Payer: Self-pay | Admitting: Family Medicine

## 2023-03-12 ENCOUNTER — Ambulatory Visit (INDEPENDENT_AMBULATORY_CARE_PROVIDER_SITE_OTHER): Payer: Medicare Other | Admitting: Family Medicine

## 2023-03-12 VITALS — BP 126/70 | HR 80 | Temp 98.5°F | Ht 69.0 in | Wt 214.4 lb

## 2023-03-12 DIAGNOSIS — M109 Gout, unspecified: Secondary | ICD-10-CM | POA: Diagnosis not present

## 2023-03-12 MED ORDER — PREDNISONE 20 MG PO TABS: 40.0000 mg | ORAL_TABLET | Freq: Every day | ORAL | 0 refills | Status: DC

## 2023-03-12 NOTE — Progress Notes (Signed)
Subjective:  Patient ID: [name], male    DOB: 1946-04-07  Age: 77 y.o. MRN: [id number]  CC:  Chief Complaint  Patient presents with   Foot Pain    Pt states that he is having left foot and ankle pain and swelling. Can't get comfortable sleeping.      HPI [name] presents for   Gout: Current flare - started 2 days ago. Unknown cause of flare. No recent diet changes. NKI - pain and swelling in L ankle, L foot. Ome in R 5th finger.  Similar to prior flare - years ago since last flare.  Daily meds:allopurinol 100mg  qd.  Diclofenac rx in past - not taking.  No hx of DM, or PUD. Egfr 55 in 01/25/23.    Tx: black cherry juice, ibuprofen 2 each morning - some relief.   Lab Results  Component Value Date   LABURIC 7.4 01/19/2017     History Patient Active Problem List   Diagnosis Date Noted   Overweight (BMI 25.0-29.9) 01/14/2020   Physical exam 07/11/2019   Primary osteoarthritis of left knee 10/22/2017   Lumbar stenosis with neurogenic claudication 05/03/2017   Gout 01/19/2017   Chest pain 09/01/2014   Acquired scoliosis 07/01/2014   Degeneration of lumbar intervertebral disc 08/08/2013   Acute low back pain 08/08/2013   Neck pain 08/08/2013   Coronary artery disease excluded 05/20/2012   Hyperlipidemia 04/29/2011   Essential hypertension 11/13/2007   Past Medical History:  Diagnosis Date   Arthritis    Cellulitis and abscess of other specified site    Colon polyps    Gout    Hypertension    Neuromuscular disorder ([place])    arthritis   Scoliosis    Past Surgical History:  Procedure Laterality Date   BACK SURGERY     COLONOSCOPY  01/10/2013   Procedure: COLONOSCOPY;  Surgeon: Rogene Houston, MD;  [facility];  Service: Endoscopy;  Laterality: N/A;  730   COLONOSCOPY N/A 08/21/2013   Procedure: COLONOSCOPY;  Surgeon: Rogene Houston, MD;  [facility]: [facility];  Service: Endoscopy;  Laterality: N/A;  1030   COLONOSCOPY N/A 08/26/2015    Procedure: COLONOSCOPY;  Surgeon: Rogene Houston, MD;  [facility]: [facility];  Service: Endoscopy;  Laterality: N/A;  240   COLONOSCOPY WITH PROPOFOL N/A 03/16/2021   Procedure: COLONOSCOPY WITH PROPOFOL;  Surgeon: Rogene Houston, MD;  Location: [facility];  Service: Endoscopy;  Laterality: N/A;  patient knows to arrive at 0630.   colonscopy     PARTIAL KNEE ARTHROPLASTY Left 11/06/2017   Procedure: UNICOMPARTMENTAL KNEE;  Surgeon: [name], MD;  Location: [facility];  Service: Orthopedics;  Laterality: Left;   Allergies  Allergen Reactions   Bee Venom Anaphylaxis   Prior to Admission medications   Medication Sig Start Date End Date Taking? Authorizing Provider  allopurinol (ZYLOPRIM) 100 MG tablet TAKE ONE TABLET DAILY Patient not taking: Reported on 01/25/2023 12/26/22   Midge Minium, MD  diclofenac (VOLTAREN) 75 MG EC tablet TAKE 1 TABLET 2 TIMES A DAY AS NEEDED 12/15/22   Wendie Agreste, MD  EPINEPHrine 0.3 mg/0.3 mL IJ SOAJ injection Inject 0.3 mg into the muscle as needed for anaphylaxis. 03/25/18   [provider]  gabapentin (NEURONTIN) 300 MG capsule Take 1 capsule (300 mg total) by mouth 2 (two) times daily as needed (pain). 08/01/22   Midge Minium, MD  HYDROcodone-acetaminophen (NORCO/VICODIN) 5-325 MG tablet  Take 1 tablet by mouth every 6 (six) hours as needed for moderate pain. 11/22/17   [provider]  lisinopril-hydrochlorothiazide (ZESTORETIC) 10-12.5 MG tablet TAKE 1 TABLET DAILY 12/26/22   Midge Minium, MD  metoprolol succinate (TOPROL-XL) 25 MG 24 hr tablet TAKE ONE TABLET ONCE DAILY 12/26/22   Midge Minium, MD  simvastatin (ZOCOR) 40 MG tablet TAKE 1 TABLET DAILY 11/14/22   Midge Minium, MD   Social History   Socioeconomic History   Marital status: Married    Spouse name: Not on file   Number of children: 2   Years of education: Not on file   Highest education level: Not on file  Occupational History    Occupation: Retired     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  Tobacco Use   Smoking status: Former    Packs/day: 3.00    Years: 30.00    Additional pack years: 0.00    Total pack years: 90.00    Types: Cigarettes    Start date: 12/18/1957    Quit date: 12/18/1996    Years since quitting: 26.2   Smokeless tobacco: Never   Tobacco comments:    started smoking as a child  Vaping Use   Vaping Use: Never used  Substance and Sexual Activity   Alcohol use: Yes    Alcohol/week: 0.0 standard drinks of alcohol    Comment: beer occasionally   Drug use: No   Sexual activity: Not on file  Other Topics Concern   Not on file  Social History Narrative   2 sons; 19 Grandchildren       Enjoys fishing    Social Determinants of Radio broadcast assistant Strain: Low Risk  (05/11/2022)   Overall Financial Resource Strain (CARDIA)    Difficulty of Paying Living Expenses: Not hard at all  Food Insecurity: No Food Insecurity (05/11/2022)   Hunger Vital Sign    Worried About Running Out of Food in the Last Year: Never true    Ran Out of Food in the Last Year: Never true  Transportation Needs: No Transportation Needs (05/11/2022)   PRAPARE - Hydrologist (Medical): No    Lack of Transportation (Non-Medical): No  Physical Activity: Insufficiently Active (05/11/2022)   Exercise Vital Sign    Days of Exercise per Week: 3 days    Minutes of Exercise per Session: 40 min  Stress: No Stress Concern Present (05/11/2022)   [place]    Feeling of Stress : Not at all  Social Connections: Moderately Isolated (05/11/2022)   Social Connection and Isolation Panel [NHANES]    Frequency of Communication with Friends and Family: More than three times a week    Frequency of Social Gatherings with Friends and Family: Three times a week    Attends Religious Services: Never    Active Member of Clubs or Organizations: No     Attends Archivist Meetings: Never    Marital Status: Married  Human resources officer Violence: Not At Risk (05/11/2022)   Humiliation, Afraid, Rape, and Kick questionnaire    Fear of Current or Ex-Partner: No    Emotionally Abused: No    Physically Abused: No    Sexually Abused: No    Review of Systems   Objective:   Vitals:   03/12/23 1118  BP: 126/70  Pulse: 80  Temp: 98.5 F (36.9 C)  TempSrc: Temporal  SpO2: 97%  Weight: 214  lb 6.4 oz (97.3 kg)  Height: 5\' 9"  (1.753 m)     Physical Exam Constitutional:      General: He is not in acute distress.    Appearance: Normal appearance. He is well-developed.  HENT:     Head: Normocephalic and atraumatic.  Cardiovascular:     Rate and Rhythm: Normal rate.  Pulmonary:     Effort: Pulmonary effort is normal.  Musculoskeletal:     Comments: Left ankle/foot, tender to palpation anterior ankle with slight warmth, soft tissue swelling extending into the foot, no wounds, no vascular tracking.  Calf nontender, pain-free motion of hip, knee.  No focal bony tenderness of foot, primarily sore at the anterior ankle.  Right hand, slight contracture of the PIP of the fifth phalanx  Skin:    General: Skin is warm and dry.  Neurological:     Mental Status: He is alert and oriented to person, place, and time.  Psychiatric:        Mood and Affect: Mood normal.        Assessment & Plan:  [name] is a 77 y.o. male . Acute gout of left ankle, unspecified cause - Plan: predniSONE (DELTASONE) 20 MG tablet No known injury, suspected acute flare of gout, left ankle.  Has not had recent flares, has been compliant with allopurinol.  Unknown trigger.  No change in med regimen for allopurinol at this time, could consider repeat uric acid after flare improves.  Prednisone ordered with potential side effects and risk discussed.  Chose prednisone as symptoms have been present for few days already, minimal improvement with  over-the-counter ibuprofen. RTC precautions given.  Has follow-up with PCP next week.   Meds ordered this encounter  Medications   predniSONE (DELTASONE) 20 MG tablet    Sig: Take 2 tablets (40 mg total) by mouth daily with breakfast.    Dispense:  10 tablet    Refill:  0   Patient Instructions  Ankle/foot pain does appear to be a gout flare.  See information below.  Start prednisone, I think your symptoms will improve quickly.  If any worsening symptoms including any rash or fever, be seen right away.  Keep follow-up as scheduled with Dr. Birdie Riddle.  Do not take any ibuprofen while taking prednisone. Take care!  Return to the clinic or go to the nearest emergency room if any of your symptoms worsen or new symptoms occur.  Gout  Gout is a condition that causes painful swelling of the joints. Gout is a type of inflammation of the joints (arthritis). This condition is caused by having too much uric acid in the body. Uric acid is a chemical that forms when the body breaks down substances called purines. Purines are important for building body proteins. When the body has too much uric acid, sharp crystals can form and build up inside the joints. This causes pain and swelling. Gout attacks can happen quickly and may be very painful (acute gout). Over time, the attacks can affect more joints and become more frequent (chronic gout). Gout can also cause uric acid to build up under the skin and inside the kidneys. What are the causes? This condition is caused by too much uric acid in your blood. This can happen because: Your kidneys do not remove enough uric acid from your blood. This is the most common cause. Your body makes too much uric acid. This can happen with some cancers and cancer treatments. It can also occur if your  body is breaking down too many red blood cells (hemolytic anemia). You eat too many foods that are high in purines. These foods include organ meats and some seafood. Alcohol,  especially beer, is also high in purines. A gout attack may be triggered by trauma or stress. What increases the risk? The following factors may make you more likely to develop this condition: Having a family history of gout. Being male and middle-aged. Being male and having gone through menopause. Taking certain medicines, including aspirin, cyclosporine, diuretics, levodopa, and niacin. Having an organ transplant. Having certain conditions, such as: Being obese. Lead poisoning. Kidney disease. A skin condition called psoriasis. Other factors include: Losing weight too quickly. Being dehydrated. Frequently drinking alcohol, especially beer. Frequently drinking beverages that are sweetened with a type of sugar called fructose. What are the signs or symptoms? An attack of acute gout happens quickly. It usually occurs in just one joint. The most common place is the big toe. Attacks often start at night. Other joints that may be affected include joints of the feet, ankle, knee, fingers, wrist, or elbow. Symptoms of this condition may include: Severe pain. Warmth. Swelling. Stiffness. Tenderness. The affected joint may be very painful to touch. Shiny, red, or purple skin. Chills and fever. Chronic gout may cause symptoms more frequently. More joints may be involved. You may also have white or yellow lumps (tophi) on your hands or feet or in other areas near your joints. How is this diagnosed? This condition is diagnosed based on your symptoms, your medical history, and a physical exam. You may have tests, such as: Blood tests to measure uric acid levels. Removal of joint fluid with a thin needle (aspiration) to look for uric acid crystals. X-rays to look for joint damage. How is this treated? Treatment for this condition has two phases: treating an acute attack and preventing future attacks. Acute gout treatment may include medicines to reduce pain and swelling, including: NSAIDs,  such as ibuprofen. Steroids. These are strong anti-inflammatory medicines that can be taken by mouth (orally) or injected into a joint. Colchicine. This medicine relieves pain and swelling when it is taken soon after an attack. It can be given by mouth or through an IV. Preventive treatment may include: Daily use of smaller doses of NSAIDs or colchicine. Use of a medicine that reduces uric acid levels in your blood, such as allopurinol. Changes to your diet. You may need to see a dietitian about what to eat and drink to prevent gout. Follow these instructions at home: During a gout attack  If directed, put ice on the affected area. To do this: Put ice in a plastic bag. Place a towel between your skin and the bag. Leave the ice on for 20 minutes, 2-3 times a day. Remove the ice if your skin turns bright red. This is very important. If you cannot feel pain, heat, or cold, you have a greater risk of damage to the area. Raise (elevate) the affected joint above the level of your heart as often as possible. Rest the joint as much as possible. If the affected joint is in your leg, you may be given crutches to use. Follow instructions from your health care provider about eating or drinking restrictions. Avoiding future gout attacks Follow a low-purine diet as told by your dietitian or health care provider. Avoid foods and drinks that are high in purines, including liver, kidney, anchovies, asparagus, herring, mushrooms, mussels, and beer. Maintain a healthy weight or lose weight  if you are overweight. If you want to lose weight, talk with your health care provider. Do not lose weight too quickly. Start or maintain an exercise program as told by your health care provider. Eating and drinking Avoid drinking beverages that contain fructose. Drink enough fluids to keep your urine pale yellow. If you drink alcohol: Limit how much you have to: 0-1 drink a day for women who are not pregnant. 0-2 drinks  a day for men. Know how much alcohol is in a drink. In the [place] one drink equals one 12 oz bottle of beer (355 mL), one 5 oz glass of wine (148 mL), or one 1 oz glass of hard liquor (44 mL). General instructions Take over-the-counter and prescription medicines only as told by your health care provider. Ask your health care provider if the medicine prescribed to you requires you to avoid driving or using machinery. Return to your normal activities as told by your health care provider. Ask your health care provider what activities are safe for you. Keep all follow-up visits. This is important. Where to find more information [employer] [facility]: www.niams.SouthExposed.es Contact a health care provider if you have: Another gout attack. Continuing symptoms of a gout attack after 10 days of treatment. Side effects from your medicines. Chills or a fever. Burning pain when you urinate. Pain in your lower back or abdomen. Get help right away if you: Have severe or uncontrolled pain. Cannot urinate. Summary Gout is painful swelling of the joints caused by having too much uric acid in the body. The most common site for gout to occur is in the big toe, but it can affect other joints in the body. Medicines and dietary changes can help to prevent and treat gout attacks. This information is not intended to replace advice given to you by your health care provider. Make sure you discuss any questions you have with your health care provider. Document Revised: 09/07/2021 Document Reviewed: 09/07/2021 Elsevier Patient Education  2023 [place],   Merri Ray, MD [place] 03/12/23 12:25 PM

## 2023-03-12 NOTE — Patient Instructions (Signed)
Ankle/foot pain does appear to be a gout flare.  See information below.  Start prednisone, I think your symptoms will improve quickly.  If any worsening symptoms including any rash or fever, be seen right away.  Keep follow-up as scheduled with Dr. Birdie Riddle.  Do not take any ibuprofen while taking prednisone. Take care!  Return to the clinic or go to the nearest emergency room if any of your symptoms worsen or new symptoms occur.  Gout  Gout is a condition that causes painful swelling of the joints. Gout is a type of inflammation of the joints (arthritis). This condition is caused by having too much uric acid in the body. Uric acid is a chemical that forms when the body breaks down substances called purines. Purines are important for building body proteins. When the body has too much uric acid, sharp crystals can form and build up inside the joints. This causes pain and swelling. Gout attacks can happen quickly and may be very painful (acute gout). Over time, the attacks can affect more joints and become more frequent (chronic gout). Gout can also cause uric acid to build up under the skin and inside the kidneys. What are the causes? This condition is caused by too much uric acid in your blood. This can happen because: Your kidneys do not remove enough uric acid from your blood. This is the most common cause. Your body makes too much uric acid. This can happen with some cancers and cancer treatments. It can also occur if your body is breaking down too many red blood cells (hemolytic anemia). You eat too many foods that are high in purines. These foods include organ meats and some seafood. Alcohol, especially beer, is also high in purines. A gout attack may be triggered by trauma or stress. What increases the risk? The following factors may make you more likely to develop this condition: Having a family history of gout. Being male and middle-aged. Being male and having gone through  menopause. Taking certain medicines, including aspirin, cyclosporine, diuretics, levodopa, and niacin. Having an organ transplant. Having certain conditions, such as: Being obese. Lead poisoning. Kidney disease. A skin condition called psoriasis. Other factors include: Losing weight too quickly. Being dehydrated. Frequently drinking alcohol, especially beer. Frequently drinking beverages that are sweetened with a type of sugar called fructose. What are the signs or symptoms? An attack of acute gout happens quickly. It usually occurs in just one joint. The most common place is the big toe. Attacks often start at night. Other joints that may be affected include joints of the feet, ankle, knee, fingers, wrist, or elbow. Symptoms of this condition may include: Severe pain. Warmth. Swelling. Stiffness. Tenderness. The affected joint may be very painful to touch. Shiny, red, or purple skin. Chills and fever. Chronic gout may cause symptoms more frequently. More joints may be involved. You may also have white or yellow lumps (tophi) on your hands or feet or in other areas near your joints. How is this diagnosed? This condition is diagnosed based on your symptoms, your medical history, and a physical exam. You may have tests, such as: Blood tests to measure uric acid levels. Removal of joint fluid with a thin needle (aspiration) to look for uric acid crystals. X-rays to look for joint damage. How is this treated? Treatment for this condition has two phases: treating an acute attack and preventing future attacks. Acute gout treatment may include medicines to reduce pain and swelling, including: NSAIDs, such as ibuprofen. Steroids.  These are strong anti-inflammatory medicines that can be taken by mouth (orally) or injected into a joint. Colchicine. This medicine relieves pain and swelling when it is taken soon after an attack. It can be given by mouth or through an IV. Preventive treatment may  include: Daily use of smaller doses of NSAIDs or colchicine. Use of a medicine that reduces uric acid levels in your blood, such as allopurinol. Changes to your diet. You may need to see a dietitian about what to eat and drink to prevent gout. Follow these instructions at home: During a gout attack  If directed, put ice on the affected area. To do this: Put ice in a plastic bag. Place a towel between your skin and the bag. Leave the ice on for 20 minutes, 2-3 times a day. Remove the ice if your skin turns bright red. This is very important. If you cannot feel pain, heat, or cold, you have a greater risk of damage to the area. Raise (elevate) the affected joint above the level of your heart as often as possible. Rest the joint as much as possible. If the affected joint is in your leg, you may be given crutches to use. Follow instructions from your health care provider about eating or drinking restrictions. Avoiding future gout attacks Follow a low-purine diet as told by your dietitian or health care provider. Avoid foods and drinks that are high in purines, including liver, kidney, anchovies, asparagus, herring, mushrooms, mussels, and beer. Maintain a healthy weight or lose weight if you are overweight. If you want to lose weight, talk with your health care provider. Do not lose weight too quickly. Start or maintain an exercise program as told by your health care provider. Eating and drinking Avoid drinking beverages that contain fructose. Drink enough fluids to keep your urine pale yellow. If you drink alcohol: Limit how much you have to: 0-1 drink a day for women who are not pregnant. 0-2 drinks a day for men. Know how much alcohol is in a drink. In the [place] one drink equals one 12 oz bottle of beer (355 mL), one 5 oz glass of wine (148 mL), or one 1 oz glass of hard liquor (44 mL). General instructions Take over-the-counter and prescription medicines only as told by your health care  provider. Ask your health care provider if the medicine prescribed to you requires you to avoid driving or using machinery. Return to your normal activities as told by your health care provider. Ask your health care provider what activities are safe for you. Keep all follow-up visits. This is important. Where to find more information [employer] [facility]: www.niams.SouthEx[employer].es Contact a health care provider if you have: Another gout attack. Continuing symptoms of a gout attack after 10 days of treatment. Side effects from your medicines. Chills or a fever. Burning pain when you urinate. Pain in your lower back or abdomen. Get help right away if you: Have severe or uncontrolled pain. Cannot urinate. Summary Gout is painful swelling of the joints caused by having too much uric acid in the body. The most common site for gout to occur is in the big toe, but it can affect other joints in the body. Medicines and dietary changes can help to prevent and treat gout attacks. This information is not intended to replace advice given to you by your health care provider. Make sure you discuss any questions you have with your health care provider. Document Revised: 09/07/2021 Document Reviewed: 09/07/2021 Elsevier Patient  Education  2023 Elsevier Inc.  

## 2023-03-22 ENCOUNTER — Encounter: Payer: Self-pay | Admitting: Family Medicine

## 2023-03-22 ENCOUNTER — Telehealth: Payer: Self-pay

## 2023-03-22 ENCOUNTER — Ambulatory Visit (INDEPENDENT_AMBULATORY_CARE_PROVIDER_SITE_OTHER): Payer: Medicare Other | Admitting: Family Medicine

## 2023-03-22 VITALS — BP 118/76 | HR 64 | Temp 98.9°F | Resp 17 | Ht 69.0 in | Wt 216.2 lb

## 2023-03-22 DIAGNOSIS — M1A9XX0 Chronic gout, unspecified, without tophus (tophi): Secondary | ICD-10-CM

## 2023-03-22 DIAGNOSIS — E785 Hyperlipidemia, unspecified: Secondary | ICD-10-CM | POA: Diagnosis not present

## 2023-03-22 DIAGNOSIS — I10 Essential (primary) hypertension: Secondary | ICD-10-CM | POA: Diagnosis not present

## 2023-03-22 LAB — HEPATIC FUNCTION PANEL
ALT: 20 U/L (ref 0–53)
AST: 11 U/L (ref 0–37)
Albumin: 3.9 g/dL (ref 3.5–5.2)
Alkaline Phosphatase: 94 U/L (ref 39–117)
Bilirubin, Direct: 0.2 mg/dL (ref 0.0–0.3)
Total Bilirubin: 0.7 mg/dL (ref 0.2–1.2)
Total Protein: 6.1 g/dL (ref 6.0–8.3)

## 2023-03-22 LAB — CBC WITH DIFFERENTIAL/PLATELET
Basophils Absolute: 0 K/uL (ref 0.0–0.1)
Basophils Relative: 0.5 % (ref 0.0–3.0)
Eosinophils Absolute: 0.2 K/uL (ref 0.0–0.7)
Eosinophils Relative: 1.9 % (ref 0.0–5.0)
HCT: 43.7 % (ref 39.0–52.0)
Hemoglobin: 14.5 g/dL (ref 13.0–17.0)
Lymphocytes Relative: 16.4 % (ref 12.0–46.0)
Lymphs Abs: 1.5 K/uL (ref 0.7–4.0)
MCHC: 33.2 g/dL (ref 30.0–36.0)
MCV: 86.2 fl (ref 78.0–100.0)
Monocytes Absolute: 1 K/uL (ref 0.1–1.0)
Monocytes Relative: 10.7 % (ref 3.0–12.0)
Neutro Abs: 6.5 K/uL (ref 1.4–7.7)
Neutrophils Relative %: 70.5 % (ref 43.0–77.0)
Platelets: 217 K/uL (ref 150.0–400.0)
RBC: 5.07 Mil/uL (ref 4.22–5.81)
RDW: 14 % (ref 11.5–15.5)
WBC: 9.2 K/uL (ref 4.0–10.5)

## 2023-03-22 LAB — TSH: TSH: 1.77 u[IU]/mL (ref 0.35–5.50)

## 2023-03-22 LAB — BASIC METABOLIC PANEL WITH GFR
BUN: 17 mg/dL (ref 6–23)
CO2: 31 meq/L (ref 19–32)
Calcium: 8.9 mg/dL (ref 8.4–10.5)
Chloride: 103 meq/L (ref 96–112)
Creatinine, Ser: 1.05 mg/dL (ref 0.40–1.50)
GFR: 70.03 mL/min
Glucose, Bld: 81 mg/dL (ref 70–99)
Potassium: 4.1 meq/L (ref 3.5–5.1)
Sodium: 141 meq/L (ref 135–145)

## 2023-03-22 LAB — LIPID PANEL
Cholesterol: 126 mg/dL (ref 0–200)
HDL: 34.1 mg/dL — ABNORMAL LOW (ref >39.00)
NonHDL: 91.91
Total CHOL/HDL Ratio: 4
Triglycerides: 208 mg/dL — ABNORMAL HIGH (ref 0.0–149.0)
VLDL: 41.6 mg/dL — ABNORMAL HIGH (ref 0.0–40.0)

## 2023-03-22 LAB — LDL CHOLESTEROL, DIRECT: Direct LDL: 64 mg/dL

## 2023-03-22 LAB — URIC ACID: Uric Acid, Serum: 6.4 mg/dL (ref 4.0–7.8)

## 2023-03-22 MED ORDER — PREDNISONE 10 MG (21) PO TBPK: ORAL_TABLET | ORAL | 0 refills | Status: DC

## 2023-03-22 MED ORDER — LISINOPRIL 10 MG PO TABS: 10.0000 mg | ORAL_TABLET | Freq: Every day | ORAL | 3 refills | Status: DC

## 2023-03-22 NOTE — Assessment & Plan Note (Signed)
Chronic problem.  Excellent control on Lisinopril HCTZ and Metoprolol, but due to his recent gout flares, will eliminate the HCTZ component and start plain Lisinopril 10mg  daily.  Will monitor BP and increase Lisinopril dose if needed now that we're stopping HCTZ.  Pt expressed understanding and is in agreement w/ plan.

## 2023-03-22 NOTE — Assessment & Plan Note (Signed)
Chronic problem, on Simvastatin 40mg daily w/o difficulty.  Check labs.  Adjust meds prn  

## 2023-03-22 NOTE — Telephone Encounter (Signed)
Informed pt of lab results  

## 2023-03-22 NOTE — Progress Notes (Signed)
   Subjective:    Patient ID: [name], male    DOB: 07-07-1946, 77 y.o.   MRN: [id number]  HPI HTN- chronic problem, on Lisinopril HCTZ 10/12.5mg  daily and Metoprolol 25mg  daily.  No CP, SOB, HA's, visual changes, edema.  Hyperlipidemia- chronic problem, on simvastatin 40mg  daily.  No abd pain, N/V.  Gout- chronic problem, on allopurinol 100mg  daily.  Just finished Prednisone taper.  Reports pain never improved.  Currently symptomatic in R hand and L ankle.   Review of Systems For ROS see HPI     Objective:   Physical Exam Vitals reviewed.  Constitutional:      General: He is not in acute distress.    Appearance: Normal appearance. He is well-developed. He is not ill-appearing.  HENT:     Head: Normocephalic and atraumatic.  Eyes:     Extraocular Movements: Extraocular movements intact.     Conjunctiva/sclera: Conjunctivae normal.     Pupils: Pupils are equal, round, and reactive to light.  Neck:     Thyroid: No thyromegaly.  Cardiovascular:     Rate and Rhythm: Normal rate and regular rhythm.     Pulses: Normal pulses.     Heart sounds: Normal heart sounds. No murmur heard. Pulmonary:     Effort: Pulmonary effort is normal. No respiratory distress.     Breath sounds: Normal breath sounds.  Abdominal:     General: Bowel sounds are normal. There is no distension.     Palpations: Abdomen is soft.  Musculoskeletal:        General: Swelling (R 4th and 5th fingers, L ankle) and tenderness (R hand and L ankle) present.     Cervical back: Normal range of motion and neck supple.     Right lower leg: No edema.     Left lower leg: Edema present.  Lymphadenopathy:     Cervical: No cervical adenopathy.  Skin:    General: Skin is warm and dry.  Neurological:     General: No focal deficit present.     Mental Status: He is alert and oriented to person, place, and time.     Cranial Nerves: No cranial nerve deficit.  Psychiatric:        Mood and Affect: Mood normal.         Behavior: Behavior normal.           Assessment & Plan:

## 2023-03-22 NOTE — Assessment & Plan Note (Signed)
Deteriorated.  Pt reports pain never resolved w/ recent Prednisone- 40mg  daily x5 days.  He wants a Pred Pack like he has had in the past.  Will stop HCTZ, restart Prednisone, and if sxs aren't improving, will increase Allopurinol.  Pt expressed understanding and is in agreement w/ plan.

## 2023-03-22 NOTE — Telephone Encounter (Signed)
-----   Message from Midge Minium, MD sent at 03/22/2023  4:11 PM EDT ----- Labs look great!  No changes at this time

## 2023-03-22 NOTE — Patient Instructions (Addendum)
Schedule your complete physical in 6 months We'll notify you of your lab results and make any changes if needed STOP the Lisinopril Hydrochlorothiazide combo pill START the plain Lisinopril TAKE the Prednisone pack as directed.  Take w/ food. INCREASE your water intake LIMIT beef while having a gout flare If the gout is not improving in the next few days, let me know! Call with any questions or concerns Stay Safe!  Stay Healthy! Hang in there!!!

## 2023-03-28 ENCOUNTER — Other Ambulatory Visit: Payer: Self-pay | Admitting: Family Medicine

## 2023-03-28 DIAGNOSIS — M1A9XX0 Chronic gout, unspecified, without tophus (tophi): Secondary | ICD-10-CM

## 2023-03-28 DIAGNOSIS — I10 Essential (primary) hypertension: Secondary | ICD-10-CM

## 2023-03-28 DIAGNOSIS — G8929 Other chronic pain: Secondary | ICD-10-CM

## 2023-04-09 ENCOUNTER — Ambulatory Visit (INDEPENDENT_AMBULATORY_CARE_PROVIDER_SITE_OTHER): Payer: Medicare Other | Admitting: Gastroenterology

## 2023-04-09 ENCOUNTER — Telehealth (INDEPENDENT_AMBULATORY_CARE_PROVIDER_SITE_OTHER): Payer: Self-pay | Admitting: Gastroenterology

## 2023-04-09 ENCOUNTER — Other Ambulatory Visit (INDEPENDENT_AMBULATORY_CARE_PROVIDER_SITE_OTHER): Payer: Self-pay | Admitting: Gastroenterology

## 2023-04-09 ENCOUNTER — Encounter (INDEPENDENT_AMBULATORY_CARE_PROVIDER_SITE_OTHER): Payer: Self-pay | Admitting: Gastroenterology

## 2023-04-09 VITALS — BP 151/78 | HR 54 | Temp 97.9°F | Ht 69.0 in | Wt 216.9 lb

## 2023-04-09 DIAGNOSIS — R152 Fecal urgency: Secondary | ICD-10-CM | POA: Diagnosis not present

## 2023-04-09 DIAGNOSIS — R140 Abdominal distension (gaseous): Secondary | ICD-10-CM | POA: Diagnosis not present

## 2023-04-09 DIAGNOSIS — R195 Other fecal abnormalities: Secondary | ICD-10-CM

## 2023-04-09 MED ORDER — PEG 3350-KCL-NA BICARB-NACL 420 G PO SOLR: 4000.0000 mL | Freq: Once | ORAL | 0 refills | Status: AC

## 2023-04-09 MED ORDER — SUTAB 1479-225-188 MG PO TABS: ORAL_TABLET | ORAL | 0 refills | Status: DC

## 2023-04-09 NOTE — Progress Notes (Signed)
Referring Provider: Sheliah Hatch, MD Primary Care Physician:  Sheliah Hatch, MD Primary GI Physician: Levon Hedger   Chief Complaint  Patient presents with   Abdominal Pain    Follow up on abdominal pain. Reports has a lot of rummbling and gas  after eating. Does not have a good BM.    HPI:   [name] is a 77 y.o. male with past medical history of arthritis, colon polyps, gout, HTN, scoliosis.  Patient presenting today for change in stool caliber/bloating.   Patient states since having Covid in December/January he has had a lot of grumbling in his stomach and feels he could not have a good BM. States stools are "squiggly."  Stools can sometimes be watery. Grumbling in his belly has improved some and feels that amount of stooling has slowed down. He has some fecal urgency. He may have 2-3 BMs per day. He denies any abdominal pain, feels like he may have a lot of flatulence. Notes when he pulls his legs up to his chest he can pass gas and this will help some. No nausea or vomiting, no weight loss. No rectal bleeding or melena. He reports he has not had a good, normal BM in months. He notes that he has to go to the restroom often quickly after he eats. No recent antibiotics, notes that he was started on a probiotic by his PCP. He does not feel that certain foods worsen his symptoms. Has some bloating. He notes that despite stools being skinny, he feels that he has to strain to defecate. He notes he was on prednisone a few weeks ago for gout but no changes in stools during that time or thereafter.   TSH 4/4 was WNL, BMP WNL  Family history: no CRC or liver disease  NSAID: none  Social: drinks on occasion, no tobacco   Last Colonoscopy:02/2021 - Diverticulosis in the sigmoid colon.                           - External hemorrhoids.                           - No specimens collected.  Recommendations:  Repeat TCS 5 years   Past Medical History:  Diagnosis Date   Arthritis     Cellulitis and abscess of other specified site    Colon polyps    Gout    Hypertension    Neuromuscular disorder    arthritis   Scoliosis     Past Surgical History:  Procedure Laterality Date   BACK SURGERY     COLONOSCOPY  01/10/2013   Procedure: COLONOSCOPY;  Surgeon: Malissa Hippo, MD;  [facility];  Service: Endoscopy;  Laterality: N/A;  730   COLONOSCOPY N/A 08/21/2013   Procedure: COLONOSCOPY;  Surgeon: Malissa Hippo, MD;  [facility];  Service: Endoscopy;  Laterality: N/A;  1030   COLONOSCOPY N/A 08/26/2015   Procedure: COLONOSCOPY;  Surgeon: Malissa Hippo, MD;  [facility];  Service: Endoscopy;  Laterality: N/A;  240   COLONOSCOPY WITH PROPOFOL N/A 03/16/2021   Procedure: COLONOSCOPY WITH PROPOFOL;  Surgeon: Malissa Hippo, MD;  [facility];  Service: Endoscopy;  Laterality: N/A;  patient knows to arrive at 0630.   colonscopy     PARTIAL KNEE ARTHROPLASTY Left 11/06/2017   Procedure: UNICOMPARTMENTAL KNEE;  Surgeon: Eulah Pont,  Jewel Baize, MD;  Location: [facility];  Service: Orthopedics;  Laterality: Left;    Current Outpatient Medications  Medication Sig Dispense Refill   allopurinol (ZYLOPRIM) 100 MG tablet TAKE ONE TABLET DAILY 90 tablet 0   diclofenac (VOLTAREN) 75 MG EC tablet TAKE 1 TABLET 2 TIMES A DAY AS NEEDED 60 tablet 0   EPINEPHrine 0.3 mg/0.3 mL IJ SOAJ injection Inject 0.3 mg into the muscle as needed for anaphylaxis.  0   gabapentin (NEURONTIN) 300 MG capsule Take 1 capsule (300 mg total) by mouth 2 (two) times daily as needed (pain). 180 capsule 1   HYDROcodone-acetaminophen (NORCO/VICODIN) 5-325 MG tablet Take 1 tablet by mouth every 6 (six) hours as needed for moderate pain.  0   lisinopril (ZESTRIL) 10 MG tablet Take 1 tablet (10 mg total) by mouth daily. 90 tablet 3   metoprolol succinate (TOPROL-XL) 25 MG 24 hr tablet TAKE ONE TABLET ONCE DAILY 90 tablet 0   PROBIOTIC, LACTOBACILLUS, PO Take by mouth. One daily      simvastatin (ZOCOR) 40 MG tablet TAKE 1 TABLET DAILY 90 tablet 1   No current facility-administered medications for this visit.    Allergies as of 04/09/2023 - Review Complete 04/09/2023  Allergen Reaction Noted   Bee venom Anaphylaxis 08/13/2015    Family History  Problem Relation Age of Onset   Kidney disease Sister    Cancer Other        Family Hx of Cancer, CAD,Diabetes,Kidney Failure   Colon cancer Neg Hx     Social History   Socioeconomic History   Marital status: Married    Spouse name: Not on file   Number of children: 2   Years of education: Not on file   Highest education level: Not on file  Occupational History   Occupation: Retired     Comment: Music therapist facility  Tobacco Use   Smoking status: Former    Packs/day: 3.00    Years: 30.00    Additional pack years: 0.00    Total pack years: 90.00    Types: Cigarettes    Start date: 12/18/1957    Quit date: 12/18/1996    Years since quitting: 26.3    Passive exposure: Current   Smokeless tobacco: Never   Tobacco comments:    started smoking as a child  Vaping Use   Vaping Use: Never used  Substance and Sexual Activity   Alcohol use: Yes    Alcohol/week: 0.0 standard drinks of alcohol    Comment: beer occasionally   Drug use: No   Sexual activity: Not on file  Other Topics Concern   Not on file  Social History Narrative   2 sons; 4 Grandchildren       Enjoys fishing    Social Determinants of Corporate investment banker Strain: Low Risk  (05/11/2022)   Overall Financial Resource Strain (CARDIA)    Difficulty of Paying Living Expenses: Not hard at all  Food Insecurity: No Food Insecurity (05/11/2022)   Hunger Vital Sign    Worried About Running Out of Food in the Last Year: Never true    Ran Out of Food in the Last Year: Never true  Transportation Needs: No Transportation Needs (05/11/2022)   PRAPARE - Administrator, Civil Service (Medical): No    Lack of Transportation  (Non-Medical): No  Physical Activity: Insufficiently Active (05/11/2022)   Exercise Vital Sign    Days of Exercise per Week: 3 days  Minutes of Exercise per Session: 40 min  Stress: No Stress Concern Present (05/11/2022)   [employer] of Occupational Health - Occupational Stress Questionnaire    Feeling of Stress : Not at all  Social Connections: Moderately Isolated (05/11/2022)   Social Connection and Isolation Panel [NHANES]    Frequency of Communication with Friends and Family: More than three times a week    Frequency of Social Gatherings with Friends and Family: Three times a week    Attends Religious Services: Never    Active Member of Clubs or Organizations: No    Attends Engineer, structural: Never    Marital Status: Married   Review of systems General: negative for malaise, night sweats, fever, chills, weight loss Neck: Negative for lumps, goiter, pain and significant neck swelling Resp: Negative for cough, wheezing, dyspnea at rest CV: Negative for chest pain, leg swelling, palpitations, orthopnea GI: denies melena, hematochezia, nausea, vomiting, constipation, dysphagia, odyonophagia, early satiety or unintentional weight loss. +changes in stool caliber +loose stools +flatulence/bloating MSK: Negative for joint pain or swelling, back pain, and muscle pain. Derm: Negative for itching or rash Psych: Denies depression, anxiety, memory loss, confusion. No homicidal or suicidal ideation.  Heme: Negative for prolonged bleeding, bruising easily, and swollen nodes. Endocrine: Negative for cold or heat intolerance, polyuria, polydipsia and goiter. Neuro: negative for tremor, gait imbalance, syncope and seizures. The remainder of the review of systems is noncontributory.  Physical Exam: BP (!) 151/78   Pulse (!) 54   Temp 97.9 F (36.6 C) (Oral)   Ht  (1.753 m)   Wt 216 lb 14.4 oz (98.4 kg)   BMI 32.03 kg/m  General:   Alert and oriented. No distress noted.  Pleasant and cooperative.  Head:  Normocephalic and atraumatic. Eyes:  Conjuctiva clear without scleral icterus. Mouth:  Oral mucosa pink and moist. Good dentition. No lesions. Heart: Normal rate and rhythm, s1 and s2 heart sounds present.  Lungs: Clear lung sounds in all lobes. Respirations equal and unlabored. Abdomen:  +BS, soft, non-tender and non-distended. No rebound or guarding. No HSM or masses noted. Derm: No palmar erythema or jaundice Msk:  Symmetrical without gross deformities. Normal posture. Extremities:  Without edema. Neurologic:  Alert and  oriented x4 Psych:  Alert and cooperative. Normal mood and affect.  Invalid input(s): "6 MONTHS"   ASSESSMENT: AVYUKT CIMO is a 77 y.o. male presenting today for change in stool caliber/loose stools, bloating/flatulence.  Change in stool caliber/loose stools, bloating and flatulence since having Covid in December/January with pencil thin stools. No rectal bleeding or melena. No weight loss. He notes fecal urgency, sometimes waking him up at night to have a BM. Last TCS in 2022 was unremarkable, suspect symptoms are secondary to post infectious IBS, however, given change in stool caliber, would recommend proceeding with colonoscopy for further evaluation. Will check celiac and CRP. He should continue with probiotic and can try IB gard to help with bloating/flatulence as well as low FODMAP to see if elimination of FODMAPs provides some improvement. Indications, risks and benefits of procedure discussed in detail with patient. Patient verbalized understanding and is in agreement to proceed with colonoscopy.     PLAN:  Schedule colonoscopy ASA II 2.  Continue with probiotic  3. Celiac panel, CRP  4. Low FODMAP food guide  5. Start otc IB gard   All questions were answered, patient verbalized understanding and is in agreement with plan as outlined above.   Follow Up: 3  months   Arynn Armand L. Jeanmarie Hubert, MSN, APRN, AGNP-C Adult-Gerontology  Nurse Practitioner [facility]  I have reviewed the note and agree with the APP's assessment as described in this progress note  Katrinka Blazing, MD Gastroenterology and Hepatology [facility]

## 2023-04-09 NOTE — Patient Instructions (Addendum)
We will get you Scheduled for colonoscopy  Continue with probiotic  I will check a few labs today  I am providing the low FODMAP food guide as this can be helpful in avoiding certain foods that may be worsening your symptoms  You can also try starting over the counter IB gard, this can be purchased at [facility], [facility], etc. And may help with your symptoms   Follow up 3 months

## 2023-04-09 NOTE — H&P (View-Only) (Signed)
Referring Provider: Sheliah Hatch, MD Primary Care Physician:  Sheliah Hatch, MD Primary GI Physician: Levon Hedger   Chief Complaint  Patient presents with   Abdominal Pain    Follow up on abdominal pain. Reports has a lot of rummbling and gas  after eating. Does not have a good BM.    HPI:   [name] is a 77 y.o. male with past medical history of arthritis, colon polyps, gout, HTN, scoliosis.  Patient presenting today for change in stool caliber/bloating.   Patient states since having Covid in December/January he has had a lot of grumbling in his stomach and feels he could not have a good BM. States stools are "squiggly."  Stools can sometimes be watery. Grumbling in his belly has improved some and feels that amount of stooling has slowed down. He has some fecal urgency. He may have 2-3 BMs per day. He denies any abdominal pain, feels like he may have a lot of flatulence. Notes when he pulls his legs up to his chest he can pass gas and this will help some. No nausea or vomiting, no weight loss. No rectal bleeding or melena. He reports he has not had a good, normal BM in months. He notes that he has to go to the restroom often quickly after he eats. No recent antibiotics, notes that he was started on a probiotic by his PCP. He does not feel that certain foods worsen his symptoms. Has some bloating. He notes that despite stools being skinny, he feels that he has to strain to defecate. He notes he was on prednisone a few weeks ago for gout but no changes in stools during that time or thereafter.   TSH 4/4 was WNL, BMP WNL  Family history: no CRC or liver disease  NSAID: none  Social: drinks on occasion, no tobacco   Last Colonoscopy:02/2021 - Diverticulosis in the sigmoid colon.                           - External hemorrhoids.                           - No specimens collected.  Recommendations:  Repeat TCS 5 years   Past Medical History:  Diagnosis Date   Arthritis     Cellulitis and abscess of other specified site    Colon polyps    Gout    Hypertension    Neuromuscular disorder    arthritis   Scoliosis     Past Surgical History:  Procedure Laterality Date   BACK SURGERY     COLONOSCOPY  01/10/2013   Procedure: COLONOSCOPY;  Surgeon: Malissa Hippo, MD;  [facility];  Service: Endoscopy;  Laterality: N/A;  730   COLONOSCOPY N/A 08/21/2013   Procedure: COLONOSCOPY;  Surgeon: Malissa Hippo, MD;  [facility];  Service: Endoscopy;  Laterality: N/A;  1030   COLONOSCOPY N/A 08/26/2015   Procedure: COLONOSCOPY;  Surgeon: Malissa Hippo, MD;  [facility];  Service: Endoscopy;  Laterality: N/A;  240   COLONOSCOPY WITH PROPOFOL N/A 03/16/2021   Procedure: COLONOSCOPY WITH PROPOFOL;  Surgeon: Malissa Hippo, MD;  [facility];  Service: Endoscopy;  Laterality: N/A;  patient knows to arrive at 0630.   colonscopy     PARTIAL KNEE ARTHROPLASTY Left 11/06/2017   Procedure: UNICOMPARTMENTAL KNEE;  Surgeon: Eulah Pont,  Jewel Baize, MD;  Location: [facility];  Service: Orthopedics;  Laterality: Left;    Current Outpatient Medications  Medication Sig Dispense Refill   allopurinol (ZYLOPRIM) 100 MG tablet TAKE ONE TABLET DAILY 90 tablet 0   diclofenac (VOLTAREN) 75 MG EC tablet TAKE 1 TABLET 2 TIMES A DAY AS NEEDED 60 tablet 0   EPINEPHrine 0.3 mg/0.3 mL IJ SOAJ injection Inject 0.3 mg into the muscle as needed for anaphylaxis.  0   gabapentin (NEURONTIN) 300 MG capsule Take 1 capsule (300 mg total) by mouth 2 (two) times daily as needed (pain). 180 capsule 1   HYDROcodone-acetaminophen (NORCO/VICODIN) 5-325 MG tablet Take 1 tablet by mouth every 6 (six) hours as needed for moderate pain.  0   lisinopril (ZESTRIL) 10 MG tablet Take 1 tablet (10 mg total) by mouth daily. 90 tablet 3   metoprolol succinate (TOPROL-XL) 25 MG 24 hr tablet TAKE ONE TABLET ONCE DAILY 90 tablet 0   PROBIOTIC, LACTOBACILLUS, PO Take by mouth. One daily      simvastatin (ZOCOR) 40 MG tablet TAKE 1 TABLET DAILY 90 tablet 1   No current facility-administered medications for this visit.    Allergies as of 04/09/2023 - Review Complete 04/09/2023  Allergen Reaction Noted   Bee venom Anaphylaxis 08/13/2015    Family History  Problem Relation Age of Onset   Kidney disease Sister    Cancer Other        Family Hx of Cancer, CAD,Diabetes,Kidney Failure   Colon cancer Neg Hx     Social History   Socioeconomic History   Marital status: Married    Spouse name: Not on file   Number of children: 2   Years of education: Not on file   Highest education level: Not on file  Occupational History   Occupation: Retired     Comment: Music therapist facility  Tobacco Use   Smoking status: Former    Packs/day: 3.00    Years: 30.00    Additional pack years: 0.00    Total pack years: 90.00    Types: Cigarettes    Start date: 12/18/1957    Quit date: 12/18/1996    Years since quitting: 26.3    Passive exposure: Current   Smokeless tobacco: Never   Tobacco comments:    started smoking as a child  Vaping Use   Vaping Use: Never used  Substance and Sexual Activity   Alcohol use: Yes    Alcohol/week: 0.0 standard drinks of alcohol    Comment: beer occasionally   Drug use: No   Sexual activity: Not on file  Other Topics Concern   Not on file  Social History Narrative   2 sons; 4 Grandchildren       Enjoys fishing    Social Determinants of Corporate investment banker Strain: Low Risk  (05/11/2022)   Overall Financial Resource Strain (CARDIA)    Difficulty of Paying Living Expenses: Not hard at all  Food Insecurity: No Food Insecurity (05/11/2022)   Hunger Vital Sign    Worried About Running Out of Food in the Last Year: Never true    Ran Out of Food in the Last Year: Never true  Transportation Needs: No Transportation Needs (05/11/2022)   PRAPARE - Administrator, Civil Service (Medical): No    Lack of Transportation  (Non-Medical): No  Physical Activity: Insufficiently Active (05/11/2022)   Exercise Vital Sign    Days of Exercise per Week: 3 days  Minutes of Exercise per Session: 40 min  Stress: No Stress Concern Present (05/11/2022)   [employer] of Occupational Health - Occupational Stress Questionnaire    Feeling of Stress : Not at all  Social Connections: Moderately Isolated (05/11/2022)   Social Connection and Isolation Panel [NHANES]    Frequency of Communication with Friends and Family: More than three times a week    Frequency of Social Gatherings with Friends and Family: Three times a week    Attends Religious Services: Never    Active Member of Clubs or Organizations: No    Attends Engineer, structural: Never    Marital Status: Married   Review of systems General: negative for malaise, night sweats, fever, chills, weight loss Neck: Negative for lumps, goiter, pain and significant neck swelling Resp: Negative for cough, wheezing, dyspnea at rest CV: Negative for chest pain, leg swelling, palpitations, orthopnea GI: denies melena, hematochezia, nausea, vomiting, constipation, dysphagia, odyonophagia, early satiety or unintentional weight loss. +changes in stool caliber +loose stools +flatulence/bloating MSK: Negative for joint pain or swelling, back pain, and muscle pain. Derm: Negative for itching or rash Psych: Denies depression, anxiety, memory loss, confusion. No homicidal or suicidal ideation.  Heme: Negative for prolonged bleeding, bruising easily, and swollen nodes. Endocrine: Negative for cold or heat intolerance, polyuria, polydipsia and goiter. Neuro: negative for tremor, gait imbalance, syncope and seizures. The remainder of the review of systems is noncontributory.  Physical Exam: BP (!) 151/78   Pulse (!) 54   Temp 97.9 F (36.6 C) (Oral)   Ht  (1.753 m)   Wt 216 lb 14.4 oz (98.4 kg)   BMI 32.03 kg/m  General:   Alert and oriented. No distress noted.  Pleasant and cooperative.  Head:  Normocephalic and atraumatic. Eyes:  Conjuctiva clear without scleral icterus. Mouth:  Oral mucosa pink and moist. Good dentition. No lesions. Heart: Normal rate and rhythm, s1 and s2 heart sounds present.  Lungs: Clear lung sounds in all lobes. Respirations equal and unlabored. Abdomen:  +BS, soft, non-tender and non-distended. No rebound or guarding. No HSM or masses noted. Derm: No palmar erythema or jaundice Msk:  Symmetrical without gross deformities. Normal posture. Extremities:  Without edema. Neurologic:  Alert and  oriented x4 Psych:  Alert and cooperative. Normal mood and affect.  Invalid input(s): "6 MONTHS"   ASSESSMENT: AVYUKT CIMO is a 77 y.o. male presenting today for change in stool caliber/loose stools, bloating/flatulence.  Change in stool caliber/loose stools, bloating and flatulence since having Covid in December/January with pencil thin stools. No rectal bleeding or melena. No weight loss. He notes fecal urgency, sometimes waking him up at night to have a BM. Last TCS in 2022 was unremarkable, suspect symptoms are secondary to post infectious IBS, however, given change in stool caliber, would recommend proceeding with colonoscopy for further evaluation. Will check celiac and CRP. He should continue with probiotic and can try IB gard to help with bloating/flatulence as well as low FODMAP to see if elimination of FODMAPs provides some improvement. Indications, risks and benefits of procedure discussed in detail with patient. Patient verbalized understanding and is in agreement to proceed with colonoscopy.     PLAN:  Schedule colonoscopy ASA II 2.  Continue with probiotic  3. Celiac panel, CRP  4. Low FODMAP food guide  5. Start otc IB gard   All questions were answered, patient verbalized understanding and is in agreement with plan as outlined above.   Follow Up: 3  months   [name] L. Jeanmarie Hubert, MSN, APRN, AGNP-C Adult-Gerontology  Nurse Practitioner [facility]  I have reviewed the note and agree with the APP's assessment as described in this progress note  Katrinka Blazing, MD Gastroenterology and Hepatology [facility]

## 2023-04-09 NOTE — Telephone Encounter (Signed)
Pt has wanted sutabs for prep;sent to pharmacy and they called back to let us know co pay would be $145.55.  Contacted pt to make him aware and he would like liquid sent in. Will send in [place] and will mail new instructions to patient

## 2023-04-12 LAB — CELIAC DISEASE PANEL
(tTG) Ab, IgA: <1 U/mL
(tTG) Ab, IgG: <1 U/mL
Deamidated Gliadin Abs, IgG: <1 U/mL
Gliadin IgA: 2.8 U/mL
Immunoglobulin A: 146 mg/dL (ref 70–320)

## 2023-04-12 LAB — C-REACTIVE PROTEIN: CRP: 16.7 mg/L — ABNORMAL HIGH (ref <8.0)

## 2023-04-24 ENCOUNTER — Ambulatory Visit (INDEPENDENT_AMBULATORY_CARE_PROVIDER_SITE_OTHER): Payer: Medicare Other | Admitting: Family Medicine

## 2023-04-24 ENCOUNTER — Encounter: Payer: Self-pay | Admitting: Family Medicine

## 2023-04-24 VITALS — BP 130/82 | HR 65 | Temp 98.4°F | Resp 17 | Ht 69.0 in | Wt 218.2 lb

## 2023-04-24 DIAGNOSIS — R600 Localized edema: Secondary | ICD-10-CM

## 2023-04-24 DIAGNOSIS — R7989 Other specified abnormal findings of blood chemistry: Secondary | ICD-10-CM | POA: Diagnosis not present

## 2023-04-24 NOTE — Patient Instructions (Signed)
Follow up as needed or as scheduled We'll notify you of your lab results and make any changes if needed INCREASE your water intake LIMIT your salt intake- this includes restaurant food, processed meats like ham or sausage, frozen meals, soup, etc Try and elevate your legs when sitting down Consider compression socks to help w/ the swelling Call with any questions or concerns Have a great summer!!!

## 2023-04-24 NOTE — Progress Notes (Signed)
   Subjective:    Patient ID: [name], male    DOB: [birth year], 77 y.o.   MRN: [id number]  HPI L foot swelling- pt reports foot has remained swollen since last gout episode.  Pt reports foot swelling is not painful but can be at times.  Typically more uncomfortable in the morning but improves as he walks on it.  Swelling is present in the morning but less if he takes ibuprofen before bed.  R foot will occasionally swell.  Pt is eating out at [employer] 3x/week.  S/P L knee replacement   Review of Systems For ROS see HPI     Objective:   Physical Exam Vitals reviewed.  Constitutional:      General: He is not in acute distress.    Appearance: Normal appearance. He is well-developed. He is not ill-appearing.  HENT:     Head: Normocephalic and atraumatic.  Eyes:     Extraocular Movements: Extraocular movements intact.     Conjunctiva/sclera: Conjunctivae normal.     Pupils: Pupils are equal, round, and reactive to light.  Neck:     Thyroid: No thyromegaly.  Cardiovascular:     Rate and Rhythm: Normal rate and regular rhythm.     Pulses: Normal pulses.     Heart sounds: Normal heart sounds. No murmur heard. Pulmonary:     Effort: Pulmonary effort is normal. No respiratory distress.     Breath sounds: Normal breath sounds.  Abdominal:     General: Bowel sounds are normal. There is no distension.     Palpations: Abdomen is soft.  Musculoskeletal:     Cervical back: Normal range of motion and neck supple.     Right lower leg: No edema.     Left lower leg: Edema (1+ pitting edema to mid shin) present.  Lymphadenopathy:     Cervical: No cervical adenopathy.  Skin:    General: Skin is warm and dry.  Neurological:     General: No focal deficit present.     Mental Status: He is alert and oriented to person, place, and time.     Cranial Nerves: No cranial nerve deficit.  Psychiatric:        Mood and Affect: Mood normal.        Behavior: Behavior normal.            Assessment & Plan:  Lower extremity edema- new.  Thankfully this is not gout as it is not red, warm, or painful.  I suspect this is due to high salt intake in diet and dysfunctional venous return of L lower leg s/p TKR.  Reviewed foods that are high in salt and the need to limit them- particularly in the heat.  Pt to increase water intake, elevate legs, and consider compression socks.  Will check BNP to assess for heart strain/volume overload although this is unlikely w/ unilateral swelling.  Other labs were all done 1 month ago and were WNL.  No need to repeat.  If swelling persists or worsens despite lifestyle changes, pt to let me know.  Pt expressed understanding and is in agreement w/ plan.

## 2023-04-25 ENCOUNTER — Telehealth: Payer: Self-pay

## 2023-04-25 LAB — PRO B NATRIURETIC PEPTIDE: NT-Pro BNP: 832 pg/mL — ABNORMAL HIGH (ref 0–376)

## 2023-04-25 NOTE — Telephone Encounter (Signed)
Informed pt of lab results  

## 2023-04-25 NOTE — Addendum Note (Signed)
Addended by: Sheliah Hatch on: 04/25/2023 12:18 PM   Modules accepted: Orders

## 2023-04-25 NOTE — Telephone Encounter (Signed)
-----   Message from Sheliah Hatch, MD sent at 04/25/2023 12:18 PM EDT ----- Your BNP is elevated which indicates your heart is working hard.  Your stress test in 2015 was normal and showed your heart was functioning well.  We will need to repeat that to make sure nothing has changed in the last 9 yrs.  This can also be caused by a lung issue- which given your smoking history may be the case.  But we'll start with the heart first and go from there.  ECHO is ordered and they will call you to schedule.

## 2023-05-02 ENCOUNTER — Ambulatory Visit (HOSPITAL_COMMUNITY)
Admission: RE | Admit: 2023-05-02 | Discharge: 2023-05-02 | Disposition: A | Discharge status: Home / Self Care | Payer: Medicare Other | Attending: Gastroenterology | Admitting: Gastroenterology

## 2023-05-02 ENCOUNTER — Telehealth: Payer: Self-pay

## 2023-05-02 ENCOUNTER — Ambulatory Visit (HOSPITAL_BASED_OUTPATIENT_CLINIC_OR_DEPARTMENT_OTHER): Payer: Medicare Other | Admitting: Anesthesiology

## 2023-05-02 ENCOUNTER — Encounter (HOSPITAL_COMMUNITY)
Admission: RE | Disposition: A | Discharge status: Home / Self Care | Payer: Self-pay | Source: Home / Self Care | Attending: Gastroenterology

## 2023-05-02 ENCOUNTER — Ambulatory Visit (HOSPITAL_COMMUNITY): Payer: Medicare Other | Admitting: Anesthesiology

## 2023-05-02 ENCOUNTER — Other Ambulatory Visit: Payer: Self-pay

## 2023-05-02 ENCOUNTER — Encounter (INDEPENDENT_AMBULATORY_CARE_PROVIDER_SITE_OTHER): Payer: Self-pay | Admitting: *Deleted

## 2023-05-02 ENCOUNTER — Encounter (HOSPITAL_COMMUNITY): Payer: Self-pay | Admitting: Gastroenterology

## 2023-05-02 DIAGNOSIS — I10 Essential (primary) hypertension: Secondary | ICD-10-CM | POA: Insufficient documentation

## 2023-05-02 DIAGNOSIS — Z8719 Personal history of other diseases of the digestive system: Secondary | ICD-10-CM | POA: Insufficient documentation

## 2023-05-02 DIAGNOSIS — D124 Benign neoplasm of descending colon: Secondary | ICD-10-CM | POA: Diagnosis not present

## 2023-05-02 DIAGNOSIS — D125 Benign neoplasm of sigmoid colon: Secondary | ICD-10-CM | POA: Insufficient documentation

## 2023-05-02 DIAGNOSIS — K648 Other hemorrhoids: Secondary | ICD-10-CM | POA: Insufficient documentation

## 2023-05-02 DIAGNOSIS — Z87891 Personal history of nicotine dependence: Secondary | ICD-10-CM | POA: Diagnosis not present

## 2023-05-02 DIAGNOSIS — Z8249 Family history of ischemic heart disease and other diseases of the circulatory system: Secondary | ICD-10-CM | POA: Insufficient documentation

## 2023-05-02 DIAGNOSIS — D120 Benign neoplasm of cecum: Secondary | ICD-10-CM | POA: Insufficient documentation

## 2023-05-02 DIAGNOSIS — D122 Benign neoplasm of ascending colon: Secondary | ICD-10-CM | POA: Insufficient documentation

## 2023-05-02 DIAGNOSIS — I2510 Atherosclerotic heart disease of native coronary artery without angina pectoris: Secondary | ICD-10-CM

## 2023-05-02 DIAGNOSIS — D126 Benign neoplasm of colon, unspecified: Secondary | ICD-10-CM

## 2023-05-02 DIAGNOSIS — K621 Rectal polyp: Secondary | ICD-10-CM | POA: Diagnosis not present

## 2023-05-02 DIAGNOSIS — D128 Benign neoplasm of rectum: Secondary | ICD-10-CM | POA: Diagnosis not present

## 2023-05-02 DIAGNOSIS — R194 Change in bowel habit: Secondary | ICD-10-CM | POA: Diagnosis not present

## 2023-05-02 DIAGNOSIS — K6389 Other specified diseases of intestine: Secondary | ICD-10-CM | POA: Diagnosis not present

## 2023-05-02 DIAGNOSIS — Z79899 Other long term (current) drug therapy: Secondary | ICD-10-CM | POA: Insufficient documentation

## 2023-05-02 DIAGNOSIS — M109 Gout, unspecified: Secondary | ICD-10-CM | POA: Insufficient documentation

## 2023-05-02 DIAGNOSIS — K635 Polyp of colon: Secondary | ICD-10-CM | POA: Diagnosis not present

## 2023-05-02 DIAGNOSIS — M1990 Unspecified osteoarthritis, unspecified site: Secondary | ICD-10-CM | POA: Insufficient documentation

## 2023-05-02 DIAGNOSIS — G709 Myoneural disorder, unspecified: Secondary | ICD-10-CM | POA: Diagnosis not present

## 2023-05-02 DIAGNOSIS — Z8616 Personal history of COVID-19: Secondary | ICD-10-CM | POA: Insufficient documentation

## 2023-05-02 DIAGNOSIS — R195 Other fecal abnormalities: Secondary | ICD-10-CM | POA: Diagnosis not present

## 2023-05-02 DIAGNOSIS — M419 Scoliosis, unspecified: Secondary | ICD-10-CM | POA: Insufficient documentation

## 2023-05-02 HISTORY — PX: COLONOSCOPY WITH PROPOFOL: SHX5780

## 2023-05-02 HISTORY — PX: POLYPECTOMY: SHX149

## 2023-05-02 HISTORY — PX: BIOPSY: SHX5522

## 2023-05-02 LAB — HM COLONOSCOPY

## 2023-05-02 SURGERY — COLONOSCOPY WITH PROPOFOL
Anesthesia: General

## 2023-05-02 MED ORDER — LIDOCAINE HCL (PF) 2 % IJ SOLN
INTRAMUSCULAR | Status: AC
  Filled 2023-05-02: qty 5

## 2023-05-02 MED ORDER — LACTATED RINGERS IV SOLN: INTRAVENOUS | Status: DC

## 2023-05-02 MED ORDER — PROPOFOL 500 MG/50ML IV EMUL
INTRAVENOUS | Status: DC | PRN
  Administered 2023-05-02: 150 ug/kg/min via INTRAVENOUS

## 2023-05-02 MED ORDER — PROPOFOL 10 MG/ML IV BOLUS
INTRAVENOUS | Status: DC | PRN
  Administered 2023-05-02: 70 mg via INTRAVENOUS

## 2023-05-02 MED ORDER — LIDOCAINE HCL (CARDIAC) PF 100 MG/5ML IV SOSY
PREFILLED_SYRINGE | INTRAVENOUS | Status: DC | PRN
  Administered 2023-05-02: 80 mg via INTRAVENOUS

## 2023-05-02 NOTE — Op Note (Signed)
[facility] Patient Name: [name] Procedure Date: 05/02/2023 1:03 PM MRN: [id number] Date of Birth: 02-Sep-1946 Attending MD: Katrinka Blazing , , [id number] CSN: [id number] Age: 77 Admit Type: Outpatient Procedure:                Colonoscopy Indications:              Change in bowel habits Providers:                Katrinka Blazing, Crystal Page, Lennice Sites                            Technician, Technician Referring MD:              Medicines:                Monitored Anesthesia Care Complications:            No immediate complications. Estimated Blood Loss:     Estimated blood loss: none. Procedure:                Pre-Anesthesia Assessment:                           - Prior to the procedure, a History and Physical                            was performed, and patient medications, allergies                            and sensitivities were reviewed. The patient's                            tolerance of previous anesthesia was reviewed.                           - The risks and benefits of the procedure and the                            sedation options and risks were discussed with the                            patient. All questions were answered and informed                            consent was obtained.                           - ASA Grade Assessment: II - A patient with mild                            systemic disease.                           After obtaining informed consent, the colonoscope                            was passed under direct vision. Throughout the  procedure, the patient's blood pressure, pulse, and                            oxygen saturations were monitored continuously. The                            [id number]-HQ190L ([id number]) scope was introduced through                            the anus and advanced to the the cecum, identified                            by appendiceal orifice and ileocecal valve. The                             colonoscopy was performed without difficulty. The                            patient tolerated the procedure well. The quality                            of the bowel preparation was adequate. Scope In: 1:15:03 PM Scope Out: 1:40:15 PM Scope Withdrawal Time: 0 hours 21 minutes 8 seconds  Total Procedure Duration: 0 hours 25 minutes 12 seconds  Findings:      The perianal and digital rectal examinations were normal.      Two sessile polyps were found in the ascending colon and cecum. The       polyps were 3 to 6 mm in size. These polyps were removed with a cold       snare. Resection and retrieval were complete.      Two sessile polyps were found in the rectum and sigmoid colon. The       polyps were 2 mm in size. These polyps were removed with a cold snare.       Resection and retrieval were complete.      The rest of the colon appeared normal. Biopsies for histology were taken       with a cold forceps from the right colon and left colon for evaluation       of microscopic colitis.      Non-bleeding internal hemorrhoids were found during retroflexion. The       hemorrhoids were medium-sized.      Query if change in BMs is related to post infectious/COVID IBS Impression:               - Two 3 to 6 mm polyps in the ascending colon and                            in the cecum, removed with a cold snare. Resected                            and retrieved.                           - Two 2 mm polyps in the rectum and in the sigmoid  colon, removed with a cold snare. Resected and                            retrieved.                           - The rest of the colon examined colon is normal.                            Biopsied.                           - Non-bleeding internal hemorrhoids. Moderate Sedation:      Per Anesthesia Care Recommendation:           - Discharge patient to home (ambulatory).                           - Resume previous diet.                            - Await pathology results.                           - Repeat colonoscopy date to be determined after                            pending pathology results are reviewed for                            screening purposes.                           - Start taking Benefiber 1 tablespoon daily to                            increase the bulk of the stool. Procedure Code(s):        --- Professional ---                           [phone], Colonoscopy, flexible; with removal of                            tumor(s), polyp(s), or other lesion(s) by snare                            technique                           45380, 59, Colonoscopy, flexible; with biopsy,                            single or multiple Diagnosis Code(s):        --- Professional ---                           D12.2, Benign neoplasm of ascending colon  D12.0, Benign neoplasm of cecum                           D12.8, Benign neoplasm of rectum                           D12.5, Benign neoplasm of sigmoid colon                           K64.8, Other hemorrhoids                           R19.4, Change in bowel habit CPT copyright 2022 American Medical Association. All rights reserved. The codes documented in this report are preliminary and upon coder review may  be revised to meet current compliance requirements. Katrinka Blazing, MD Katrinka Blazing,  05/02/2023 1:52:11 PM This report has been signed electronically. Number of Addenda: 0

## 2023-05-02 NOTE — Anesthesia Postprocedure Evaluation (Signed)
Anesthesia Post Note  Patient: [name]  Procedure(s) Performed: COLONOSCOPY WITH PROPOFOL BIOPSY POLYPECTOMY INTESTINAL  Patient location during evaluation: Phase II Anesthesia Type: General Level of consciousness: awake and alert and oriented Pain management: pain level controlled Vital Signs Assessment: post-procedure vital signs reviewed and stable Respiratory status: spontaneous breathing, nonlabored ventilation and respiratory function stable Cardiovascular status: blood pressure returned to baseline and stable Postop Assessment: no apparent nausea or vomiting Anesthetic complications: no  No notable events documented.   Last Vitals:  Vitals:   05/02/23 1213 05/02/23 1347  BP: (!) 195/93 134/67  Pulse: 61 68  Resp:  20  Temp: 37.1 C 36.4 C  SpO2: 96% 94%    Last Pain:  Vitals:   05/02/23 1347  TempSrc: Oral  PainSc: 0-No pain                 Annika Selke C Ulla Mckiernan

## 2023-05-02 NOTE — Telephone Encounter (Signed)
Good afternoon , Dr Beverely Low placed this on 04/25/23 pt states he has not heard anything yet . Could you check on this please for me ?

## 2023-05-02 NOTE — Telephone Encounter (Signed)
This was ordered on 5/8 and is listed under the CV procedures tab as 'ordered'.  Can we please check on the status of this?

## 2023-05-02 NOTE — Interval H&P Note (Signed)
History and Physical Interval Note:  05/02/2023 12:37 PM  [name]  has presented today for surgery, with the diagnosis of CHANGE IN STOOL CALIBER.  The various methods of treatment have been discussed with the patient and family. After consideration of risks, benefits and other options for treatment, the patient has consented to  Procedure(s) with comments: COLONOSCOPY WITH PROPOFOL (N/A) - 1:30PM ASA 2 as a surgical intervention.  The patient's history has been reviewed, patient examined, no change in status, stable for surgery.  I have reviewed the patient's chart and labs.  Questions were answered to the patient's satisfaction.     Katrinka Blazing Mayorga

## 2023-05-02 NOTE — Discharge Instructions (Addendum)
You are being discharged to home.  Resume your previous diet.  We are waiting for your pathology results.  Your physician has recommended a repeat colonoscopy (date to be determined after pending pathology results are reviewed) for screening purposes.  Start taking Benefiber 1 tablespoon daily to increase the bulk of the stool.

## 2023-05-02 NOTE — Transfer of Care (Signed)
Immediate Anesthesia Transfer of Care Note  Patient: America Brown Leinberger  Procedure(s) Performed: COLONOSCOPY WITH PROPOFOL BIOPSY POLYPECTOMY INTESTINAL  Patient Location: PACU  Anesthesia Type:General  Level of Consciousness: drowsy and patient cooperative  Airway & Oxygen Therapy: Patient Spontanous Breathing  Post-op Assessment: Report given to RN and Post -op Vital signs reviewed and stable  Post vital signs: Reviewed and stable  Last Vitals:  Vitals Value Taken Time  BP 134/67 05/02/23 1347  Temp 36.4 C 05/02/23 1347  Pulse 68 05/02/23 1347  Resp 20 05/02/23 1347  SpO2 94 % 05/02/23 1347    Last Pain:  Vitals:   05/02/23 1347  TempSrc: Oral  PainSc: 0-No pain      Patients Stated Pain Goal: 8 (05/02/23 1213)  Complications: No notable events documented.

## 2023-05-02 NOTE — Anesthesia Preprocedure Evaluation (Signed)
Anesthesia Evaluation  Patient identified by MRN, date of birth, ID band Patient awake    Reviewed: Allergy & Precautions, NPO status , Patient's Chart, lab work & pertinent test results, reviewed documented beta blocker date and time   Airway Mallampati: II  TM Distance: >3 FB Neck ROM: Full    Dental  (+) Upper Dentures, Lower Dentures   Pulmonary former smoker   Pulmonary exam normal breath sounds clear to auscultation       Cardiovascular Exercise Tolerance: Good hypertension, Pt. on medications and Pt. on home beta blockers + CAD  Normal cardiovascular exam Rhythm:Regular Rate:Normal     Neuro/Psych  Neuromuscular disease    GI/Hepatic negative GI ROS, Neg liver ROS,,,  Endo/Other  negative endocrine ROS    Renal/GU negative Renal ROS     Musculoskeletal  (+) Arthritis  (gout),  Back pain    Abdominal   Peds  Hematology negative hematology ROS (+)   Anesthesia Other Findings Left foot swelling  Reproductive/Obstetrics                             Anesthesia Physical Anesthesia Plan  ASA: 2  Anesthesia Plan: General   Post-op Pain Management: Minimal or no pain anticipated   Induction: Intravenous  PONV Risk Score and Plan: 1 and Propofol infusion  Airway Management Planned: Nasal Cannula and Natural Airway  Additional Equipment:   Intra-op Plan:   Post-operative Plan:   Informed Consent: I have reviewed the patients History and Physical, chart, labs and discussed the procedure including the risks, benefits and alternatives for the proposed anesthesia with the patient or authorized representative who has indicated his/her understanding and acceptance.     Dental advisory given  Plan Discussed with: CRNA and Surgeon  Anesthesia Plan Comments:        Anesthesia Quick Evaluation

## 2023-05-02 NOTE — Telephone Encounter (Signed)
Pt wife called asking about the Echo I do not see an order for this this   Please advise was this something they were supposed to be having done or just going back to cardiology

## 2023-05-03 ENCOUNTER — Telehealth: Payer: Self-pay | Admitting: Family Medicine

## 2023-05-03 LAB — SURGICAL PATHOLOGY

## 2023-05-03 NOTE — Telephone Encounter (Signed)
Pt states the swelling in left foot has become worse and now is in the right foot . Having pain when walking .what do you advise ?

## 2023-05-03 NOTE — Telephone Encounter (Signed)
This what [name] found out about the pt Echo that was ordered on 04/25/23 and was in [facility] procedures lab

## 2023-05-03 NOTE — Telephone Encounter (Signed)
Pt is coming in the morning to see Dr Beverely Low

## 2023-05-03 NOTE — Telephone Encounter (Signed)
Rockwell [place],  I wanted to follow up on this order that was placed on 5/8 for an Echo. I do see the message that states it was not assigned to a workqueue which is why it wasn't scheduled. Can you help me understand that process and show me what the error was with the order so we can make sure this doesn't happen again.   Thanks a lot! Lenard Simmer, [facility] Assistant Manager [employer] Phone: [phone] Fax: [phone]

## 2023-05-03 NOTE — Telephone Encounter (Signed)
Caller name: SIVA LIVAUDAIS  On DPR?: Yes  Call back number: [phone] (home)  Provider they see: Sheliah Hatch, MD  Reason for call:   Pt states L foot worse and now moved to R foot and R hand. Can't bend foot and more difficult to walk. Advise

## 2023-05-03 NOTE — Telephone Encounter (Signed)
Needs repeat evaluation to assess for gout vs edema and how we can best treat him

## 2023-05-04 ENCOUNTER — Encounter: Payer: Self-pay | Admitting: Family Medicine

## 2023-05-04 ENCOUNTER — Ambulatory Visit (INDEPENDENT_AMBULATORY_CARE_PROVIDER_SITE_OTHER): Payer: Medicare Other | Admitting: Family Medicine

## 2023-05-04 VITALS — BP 120/70 | HR 71 | Temp 98.2°F | Resp 17 | Ht 69.0 in | Wt 205.0 lb

## 2023-05-04 DIAGNOSIS — M1A9XX0 Chronic gout, unspecified, without tophus (tophi): Secondary | ICD-10-CM | POA: Diagnosis not present

## 2023-05-04 MED ORDER — PREDNISONE 10 MG PO TABS: ORAL_TABLET | ORAL | 0 refills | Status: DC

## 2023-05-04 MED ORDER — ALLOPURINOL 100 MG PO TABS: 200.0000 mg | ORAL_TABLET | Freq: Every day | ORAL | 1 refills | Status: DC

## 2023-05-04 NOTE — Patient Instructions (Signed)
Follow up as needed or as scheduled START the Prednisone as directed- 3 pills at the same time x3 days, then 2 pills at the same time x3 days, then 1 pill daily.  Take w/ food  INCREASE the Allopurinol to 200mg  (2 pills) daily to help prevent additional flares LOTS of water!! Call with any questions or concerns Hang in there!!!

## 2023-05-04 NOTE — Progress Notes (Signed)
   Subjective:    Patient ID: [name], male    DOB: 1946/08/06, 77 y.o.   MRN: [id number]  HPI Gout- 'this gotta be gout'.  Pt reports swelling and pain in both feet.  Now having pain in R hand.  Areas are red and swollen and exquisitely TTP.      Review of Systems For ROS see HPI     Objective:   Physical Exam Vitals reviewed.  Constitutional:      General: He is not in acute distress.    Appearance: Normal appearance. He is not ill-appearing.  HENT:     Head: Normocephalic and atraumatic.  Cardiovascular:     Pulses: Normal pulses.  Musculoskeletal:        General: Swelling (swelling and redness of R hand, bilateral feet) present.     Right lower leg: Edema present.     Left lower leg: Edema present.  Skin:    General: Skin is warm and dry.  Neurological:     General: No focal deficit present.     Mental Status: He is alert and oriented to person, place, and time.     Gait: Gait abnormal (antalgic gait).  Psychiatric:        Mood and Affect: Mood normal.        Behavior: Behavior normal.        Thought Content: Thought content normal.           Assessment & Plan:   Gout- deteriorated.  Suspect this was triggered by his colonoscopy prep.  Pt now w/ sxs in both feet and R hand.  Very painful.  Encouraged increased water intake.  Will double Allopurinol to 200mg  daily and start Prednisone taper.  Reviewed supportive care and red flags that should prompt return.  Pt expressed understanding and is in agreement w/ plan.

## 2023-05-07 NOTE — Progress Notes (Signed)
Sigmoid, rectum and descending colon polyps were collected in jar C altogether

## 2023-05-08 ENCOUNTER — Encounter (HOSPITAL_COMMUNITY): Payer: Self-pay | Admitting: Gastroenterology

## 2023-05-16 ENCOUNTER — Ambulatory Visit (INDEPENDENT_AMBULATORY_CARE_PROVIDER_SITE_OTHER): Payer: Medicare Other | Admitting: *Deleted

## 2023-05-16 DIAGNOSIS — Z0000 Encounter for general adult medical examination without abnormal findings: Secondary | ICD-10-CM

## 2023-05-16 NOTE — Patient Instructions (Signed)
[name] , Thank you for taking time to come for your Medicare Wellness Visit. I appreciate your ongoing commitment to your health goals. Please review the following plan we discussed and let me know if I can assist you in the future.   Screening recommendations/referrals: Colonoscopy: up to date Recommended yearly ophthalmology/optometry visit for glaucoma screening and checkup Recommended yearly dental visit for hygiene and checkup  Vaccinations: Influenza vaccine: up to date Pneumococcal vaccine: up to date Tdap vaccine: Education provided Shingles vaccine: Education provided    Advanced directives: Education provided    Preventive Care 65 Years and Older, Male Preventive care refers to lifestyle choices and visits with your health care provider that can promote health and wellness. What does preventive care include? A yearly physical exam. This is also called an annual well check. Dental exams once or twice a year. Routine eye exams. Ask your health care provider how often you should have your eyes checked. Personal lifestyle choices, including: Daily care of your teeth and gums. Regular physical activity. Eating a healthy diet. Avoiding tobacco and drug use. Limiting alcohol use. Practicing safe sex. Taking low doses of aspirin every day. Taking vitamin and mineral supplements as recommended by your health care provider. What happens during an annual well check? The services and screenings done by your health care provider during your annual well check will depend on your age, overall health, lifestyle risk factors, and family history of disease. Counseling  Your health care provider may ask you questions about your: Alcohol use. Tobacco use. Drug use. Emotional well-being. Home and relationship well-being. Sexual activity. Eating habits. History of falls. Memory and ability to understand (cognition). Work and work Astronomer. Screening  You may have the  following tests or measurements: Height, weight, and BMI. Blood pressure. Lipid and cholesterol levels. These may be checked every 5 years, or more frequently if you are over 65 years old. Skin check. Lung cancer screening. You may have this screening every year starting at age 69 if you have a 30-pack-year history of smoking and currently smoke or have quit within the past 15 years. Fecal occult blood test (FOBT) of the stool. You may have this test every year starting at age 45. Flexible sigmoidoscopy or colonoscopy. You may have a sigmoidoscopy every 5 years or a colonoscopy every 10 years starting at age 99. Prostate cancer screening. Recommendations will vary depending on your family history and other risks. Hepatitis C blood test. Hepatitis B blood test. Sexually transmitted disease (STD) testing. Diabetes screening. This is done by checking your blood sugar (glucose) after you have not eaten for a while (fasting). You may have this done every 1-3 years. Abdominal aortic aneurysm (AAA) screening. You may need this if you are a current or former smoker. Osteoporosis. You may be screened starting at age 83 if you are at high risk. Talk with your health care provider about your test results, treatment options, and if necessary, the need for more tests. Vaccines  Your health care provider may recommend certain vaccines, such as: Influenza vaccine. This is recommended every year. Tetanus, diphtheria, and acellular pertussis (Tdap, Td) vaccine. You may need a Td booster every 10 years. Zoster vaccine. You may need this after age 63. Pneumococcal 13-valent conjugate (PCV13) vaccine. One dose is recommended after age 75. Pneumococcal polysaccharide (PPSV23) vaccine. One dose is recommended after age 3. Talk to your health care provider about which screenings and vaccines you need and how often you need them. This information  is not intended to replace advice given to you by your health care  provider. Make sure you discuss any questions you have with your health care provider. Document Released: 12/31/2015 Document Revised: 08/23/2016 Document Reviewed: 10/05/2015 Elsevier Interactive Patient Education  2017 [employer].  Fall Prevention in the Home Falls can cause injuries. They can happen to people of all ages. There are many things you can do to make your home safe and to help prevent falls. What can I do on the outside of my home? Regularly fix the edges of walkways and driveways and fix any cracks. Remove anything that might make you trip as you walk through a door, such as a raised step or threshold. Trim any bushes or trees on the path to your home. Use bright outdoor lighting. Clear any walking paths of anything that might make someone trip, such as rocks or tools. Regularly check to see if handrails are loose or broken. Make sure that both sides of any steps have handrails. Any raised decks and porches should have guardrails on the edges. Have any leaves, snow, or ice cleared regularly. Use sand or salt on walking paths during winter. Clean up any spills in your garage right away. This includes oil or grease spills. What can I do in the bathroom? Use night lights. Install grab bars by the toilet and in the tub and shower. Do not use towel bars as grab bars. Use non-skid mats or decals in the tub or shower. If you need to sit down in the shower, use a plastic, non-slip stool. Keep the floor dry. Clean up any water that spills on the floor as soon as it happens. Remove soap buildup in the tub or shower regularly. Attach bath mats securely with double-sided non-slip rug tape. Do not have throw rugs and other things on the floor that can make you trip. What can I do in the bedroom? Use night lights. Make sure that you have a light by your bed that is easy to reach. Do not use any sheets or blankets that are too big for your bed. They should not hang down onto the  floor. Have a firm chair that has side arms. You can use this for support while you get dressed. Do not have throw rugs and other things on the floor that can make you trip. What can I do in the kitchen? Clean up any spills right away. Avoid walking on wet floors. Keep items that you use a lot in easy-to-reach places. If you need to reach something above you, use a strong step stool that has a grab bar. Keep electrical cords out of the way. Do not use floor polish or wax that makes floors slippery. If you must use wax, use non-skid floor wax. Do not have throw rugs and other things on the floor that can make you trip. What can I do with my stairs? Do not leave any items on the stairs. Make sure that there are handrails on both sides of the stairs and use them. Fix handrails that are broken or loose. Make sure that handrails are as long as the stairways. Check any carpeting to make sure that it is firmly attached to the stairs. Fix any carpet that is loose or worn. Avoid having throw rugs at the top or bottom of the stairs. If you do have throw rugs, attach them to the floor with carpet tape. Make sure that you have a light switch at the top of  the stairs and the bottom of the stairs. If you do not have them, ask someone to add them for you. What else can I do to help prevent falls? Wear shoes that: Do not have high heels. Have rubber bottoms. Are comfortable and fit you well. Are closed at the toe. Do not wear sandals. If you use a stepladder: Make sure that it is fully opened. Do not climb a closed stepladder. Make sure that both sides of the stepladder are locked into place. Ask someone to hold it for you, if possible. Clearly mark and make sure that you can see: Any grab bars or handrails. First and last steps. Where the edge of each step is. Use tools that help you move around (mobility aids) if they are needed. These include: Canes. Walkers. Scooters. Crutches. Turn on the  lights when you go into a dark area. Replace any light bulbs as soon as they burn out. Set up your furniture so you have a clear path. Avoid moving your furniture around. If any of your floors are uneven, fix them. If there are any pets around you, be aware of where they are. Review your medicines with your doctor. Some medicines can make you feel dizzy. This can increase your chance of falling. Ask your doctor what other things that you can do to help prevent falls. This information is not intended to replace advice given to you by your health care provider. Make sure you discuss any questions you have with your health care provider. Document Released: 09/30/2009 Document Revised: 05/11/2016 Document Reviewed: 01/08/2015 Elsevier Interactive Patient Education  2017 [employer].

## 2023-05-16 NOTE — Progress Notes (Signed)
Subjective:   [name] is a 77 y.o. male who presents for Medicare Annual/Subsequent preventive examination. I connected with  [name] on 05/16/23 by a telephone enabled telemedicine application and verified that I am speaking with the correct person using two identifiers.   I discussed the limitations of evaluation and management by telemedicine. The patient expressed understanding and agreed to proceed.  Patient location: home  Provider location: telephone/home    Review of Systems     Cardiac Risk Factors include: male gender;advanced age (>80men, >29 women);hypertension     Objective:    Today's Vitals   There is no height or weight on file to calculate BMI.     05/16/2023    8:36 AM 05/02/2023   12:08 PM 05/11/2022   10:03 AM 03/16/2021    6:54 AM 03/14/2021    8:50 AM 02/21/2021    9:03 AM 01/14/2020    8:28 AM  Advanced Directives  Does Patient Have a Medical Advance Directive? No No No No No No No  Would patient like information on creating a medical advance directive? No - Patient declined No - Patient declined No - Patient declined No - Patient declined No - Patient declined No - Patient declined Yes (MAU/Ambulatory/Procedural Areas - Information given)    Current Medications (verified) Outpatient Encounter Medications as of 05/16/2023  Medication Sig   acetaminophen (TYLENOL) 650 MG CR tablet Take 1,300 mg by mouth every 8 (eight) hours as needed for pain.   allopurinol (ZYLOPRIM) 100 MG tablet Take 2 tablets (200 mg total) by mouth daily.   aspirin EC 81 MG tablet Take 81 mg by mouth daily. Swallow whole.   diclofenac (VOLTAREN) 75 MG EC tablet TAKE 1 TABLET 2 TIMES A DAY AS NEEDED   EPINEPHrine 0.3 mg/0.3 mL IJ SOAJ injection Inject 0.3 mg into the muscle as needed for anaphylaxis.   gabapentin (NEURONTIN) 300 MG capsule Take 1 capsule (300 mg total) by mouth 2 (two) times daily as needed (pain). (Patient taking differently: Take 300 mg by mouth  daily.)   lisinopril (ZESTRIL) 10 MG tablet Take 1 tablet (10 mg total) by mouth daily.   metoprolol succinate (TOPROL-XL) 25 MG 24 hr tablet TAKE ONE TABLET ONCE DAILY   oxymetazoline (AFRIN) 0.05 % nasal spray Place 1 spray into both nostrils 2 (two) times daily as needed for congestion.   predniSONE (DELTASONE) 10 MG tablet 3 tabs x3 days and then 2 tabs x3 days and then 1 tab x3 days.  Take w/ food.   PROBIOTIC, LACTOBACILLUS, PO Take 1 capsule by mouth daily.   simvastatin (ZOCOR) 40 MG tablet TAKE 1 TABLET DAILY   No facility-administered encounter medications on file as of 05/16/2023.    Allergies (verified) Bee venom   History: Past Medical History:  Diagnosis Date   Arthritis    Cellulitis and abscess of other specified site    Colon polyps    Gout    Hypertension    Neuromuscular disorder (HCC)    arthritis   Scoliosis    Past Surgical History:  Procedure Laterality Date   BACK SURGERY     BIOPSY  05/02/2023   Procedure: BIOPSY;  Surgeon: Dolores Frame, MD;  [facility];  Service: Gastroenterology;;   COLONOSCOPY  01/10/2013   Procedure: COLONOSCOPY;  Surgeon: Malissa Hippo, MD;  [facility]: [facility];  Service: Endoscopy;  Laterality: N/A;  730   COLONOSCOPY N/A 08/21/2013   Procedure: COLONOSCOPY;  Surgeon: Malissa Hippo, MD;  [facility]: [facility];  Service: Endoscopy;  Laterality: N/A;  1030   COLONOSCOPY N/A 08/26/2015   Procedure: COLONOSCOPY;  Surgeon: Malissa Hippo, MD;  [facility]: [facility];  Service: Endoscopy;  Laterality: N/A;  240   COLONOSCOPY WITH PROPOFOL N/A 03/16/2021   Procedure: COLONOSCOPY WITH PROPOFOL;  Surgeon: Malissa Hippo, MD;  Location: [facility];  Service: Endoscopy;  Laterality: N/A;  patient knows to arrive at 0630.   COLONOSCOPY WITH PROPOFOL N/A 05/02/2023   Procedure: COLONOSCOPY WITH PROPOFOL;  Surgeon: Dolores Frame, MD;  [facility]: [facility];  Service: Gastroenterology;   Laterality: N/A;  1:30PM ASA 2   colonscopy     PARTIAL KNEE ARTHROPLASTY Left 11/06/2017   Procedure: UNICOMPARTMENTAL KNEE;  Surgeon: Sheral Apley, MD;  Location: [facility];  Service: Orthopedics;  Laterality: Left;   POLYPECTOMY  05/02/2023   Procedure: POLYPECTOMY INTESTINAL;  Surgeon: Dolores Frame, MD;  [facility];  Service: Gastroenterology;;   Family History  Problem Relation Age of Onset   Kidney disease Sister    Cancer Other        Family Hx of Cancer, CAD,Diabetes,Kidney Failure   Colon cancer Neg Hx    Social History   Socioeconomic History   Marital status: Married    Spouse name: Not on file   Number of children: 2   Years of education: Not on file   Highest education level: Not on file  Occupational History   Occupation: Retired     Comment: Music therapist facility  Tobacco Use   Smoking status: Former    Packs/day: 3.00    Years: 30.00    Additional pack years: 0.00    Total pack years: 90.00    Types: Cigarettes    Start date: 12/18/1957    Quit date: 12/18/1996    Years since quitting: 26.4    Passive exposure: Current   Smokeless tobacco: Never   Tobacco comments:    started smoking as a child  Vaping Use   Vaping Use: Never used  Substance and Sexual Activity   Alcohol use: Yes    Alcohol/week: 0.0 standard drinks of alcohol    Comment: beer occasionally   Drug use: No   Sexual activity: Yes  Other Topics Concern   Not on file  Social History Narrative   2 sons; 4 Grandchildren       Enjoys fishing    Social Determinants of Corporate investment banker Strain: Low Risk  (05/16/2023)   Overall Financial Resource Strain (CARDIA)    Difficulty of Paying Living Expenses: Not hard at all  Food Insecurity: No Food Insecurity (05/16/2023)   Hunger Vital Sign    Worried About Running Out of Food in the Last Year: Never true    Ran Out of Food in the Last Year: Never true  Transportation Needs: No Transportation  Needs (05/16/2023)   PRAPARE - Administrator, Civil Service (Medical): No    Lack of Transportation (Non-Medical): No  Physical Activity: Inactive (05/16/2023)   Exercise Vital Sign    Days of Exercise per Week: 0 days    Minutes of Exercise per Session: 0 min  Stress: No Stress Concern Present (05/16/2023)   [employer] of Occupational Health - Occupational Stress Questionnaire    Feeling of Stress : Not at all  Social Connections: Moderately Integrated (05/16/2023)   Social Connection and Isolation Panel [NHANES]  Frequency of Communication with Friends and Family: More than three times a week    Frequency of Social Gatherings with Friends and Family: Once a week    Attends Religious Services: 1 to 4 times per year    Active Member of [employer] or Organizations: No    Attends Engineer, structural: Never    Marital Status: Married    Tobacco Counseling Counseling given: Not Answered Tobacco comments: started smoking as a child   Clinical Intake:  Pre-visit preparation completed: Yes  Pain : No/denies pain     Diabetes: No  How often do you need to have someone help you when you read instructions, pamphlets, or other written materials from your doctor or pharmacy?: 1 - Never  Diabetic?  no  Interpreter Needed?: No  Information entered by :: Remi Haggard LPN   Activities of Daily Living    05/16/2023    8:44 AM 09/20/2022    7:39 AM  In your present state of health, do you have any difficulty performing the following activities:  Hearing? 0 0  Vision? 0 0  Difficulty concentrating or making decisions? 0 0  Walking or climbing stairs? 0 0  Dressing or bathing? 0 0  Doing errands, shopping? 0 0  Preparing Food and eating ? N   Using the Toilet? N   In the past six months, have you accidently leaked urine? N   Do you have problems with loss of bowel control? N   Managing your Medications? N   Managing your Finances? N   Housekeeping or  managing your Housekeeping? N     Patient Care Team: Sheliah Hatch, MD as PCP - General (Family Medicine) Shirlean Kelly, MD as Consulting Physician (Neurosurgery) Laqueta Linden, MD (Inactive) as Attending Physician (Cardiology) Malissa Hippo, MD (Inactive) as Consulting Physician (Gastroenterology) Dahlia Byes, [facility] (Inactive) as Pharmacist (Pharmacist)  Indicate any recent Medical Services you may have received from other than [facility] providers in the past year (date may be approximate).     Assessment:   This is a routine wellness examination for [name].  Hearing/Vision screen Hearing Screening - Comments:: No trouble hearing Vision Screening - Comments:: Up to date [facility]  Dietary issues and exercise activities discussed: Current Exercise Habits: The patient does not participate in regular exercise at present   Goals Addressed             This Visit's Progress    Patient Stated       Maintain current lifestyle       Depression Screen    05/16/2023    8:37 AM 04/24/2023    8:37 AM 03/12/2023   11:18 AM 01/25/2023    2:03 PM 09/20/2022    7:34 AM 05/11/2022   10:06 AM 03/23/2022    9:25 AM  PHQ 2/9 Scores  PHQ - 2 Score 0 0 0 0 0 0 0  PHQ- 9 Score 0 0 0 0 0 2 2    Fall Risk    05/16/2023    8:34 AM 05/16/2023    8:32 AM 04/24/2023    8:37 AM 03/12/2023   11:17 AM 01/25/2023    2:03 PM  Fall Risk   Falls in the past year? 0 0 0 0 0  Number falls in past yr: 0 0 0 0 0  Injury with Fall? 0 0 0 0 0  Risk for fall due to :   No Fall Risks No Fall Risks No Fall  Risks  Follow up Falls evaluation completed Falls evaluation completed;Education provided;Falls prevention discussed Falls evaluation completed  Falls evaluation completed    FALL RISK PREVENTION PERTAINING TO THE HOME:  Any stairs in or around the home? No  If so, are there any without handrails? No  Home free of loose throw rugs in walkways, pet beds, electrical cords, etc? Yes  Adequate  lighting in your home to reduce risk of falls? Yes   ASSISTIVE DEVICES UTILIZED TO PREVENT FALLS:  Life alert? No  Use of a cane, walker or w/c? No  Grab bars in the bathroom? No  Shower chair or bench in shower? No  Elevated toilet seat or a handicapped toilet? No   TIMED UP AND GO:  Was the test performed? No .    Cognitive Function:    07/25/2018    2:18 PM  MMSE - Mini Mental State Exam  Orientation to time 5  Orientation to Place 5  Registration 3  Attention/ Calculation 3  Recall 1  Language- name 2 objects 2  Language- repeat 1  Language- follow 3 step command 3  Language- read & follow direction 1  Write a sentence 1  Copy design 1  Total score 26        05/16/2023    8:34 AM 09/20/2022    7:36 AM 05/11/2022   10:02 AM 01/14/2020    8:29 AM  6CIT Screen  What Year? 0 points 0 points 0 points 0 points  What month? 0 points 0 points 0 points 0 points  What time? 0 points 0 points 0 points 0 points  Count back from 20 4 points 0 points 0 points 0 points  Months in reverse 2 points 2 points 2 points 0 points  Repeat phrase 0 points 0 points 0 points 0 points  Total Score 6 points 2 points 2 points 0 points    Immunizations Immunization History  Administered Date(s) Administered   Moderna Sars-Covid-2 Vaccination 02/19/2020, 03/23/2020   Pneumococcal Conjugate-13 07/18/2017   Pneumococcal Polysaccharide-23 12/30/2018   Tdap 07/30/2007   Zoster Recombinat (Shingrix) 07/26/2021   Zoster, Live 01/19/2015    TDAP status: Due, Education has been provided regarding the importance of this vaccine. Advised may receive this vaccine at local pharmacy or Health Dept. Aware to provide a copy of the vaccination record if obtained from local pharmacy or Health Dept. Verbalized acceptance and understanding.  Flu Vaccine status: Up to date  Pneumococcal vaccine status: Up to date  Covid-19 vaccine status: Information provided on how to obtain vaccines.   Qualifies  for Shingles Vaccine? Yes   Zostavax completed No   Shingrix Completed?: No.    Education has been provided regarding the importance of this vaccine. Patient has been advised to call insurance company to determine out of pocket expense if they have not yet received this vaccine. Advised may also receive vaccine at local pharmacy or Health Dept. Verbalized acceptance and understanding.  Screening Tests Health Maintenance  Topic Date Due   INFLUENZA VACCINE  07/19/2023   Medicare Annual Wellness (AWV)  05/15/2024   Colonoscopy  05/01/2028   Pneumonia Vaccine 74+ Years old  Completed   Hepatitis C Screening  Completed   HPV VACCINES  Aged Out   DTaP/Tdap/Td  Discontinued   COVID-19 Vaccine  Discontinued   Zoster Vaccines- Shingrix  Discontinued    Health Maintenance  There are no preventive care reminders to display for this patient.   Colorectal cancer screening: Type  of screening: Colonoscopy. Completed 2024. Repeat every 5 years  Lung Cancer Screening: (Low Dose CT Chest recommended if Age 50-80 years, 30 pack-year currently smoking OR have quit w/in 15years.) does not qualify.   Lung Cancer Screening Referral:   Additional Screening:  Hepatitis C Screening: does not qualify; Completed 2023  Vision Screening: Recommended annual ophthalmology exams for early detection of glaucoma and other disorders of the eye. Is the patient up to date with their annual eye exam?  Yes  Who is the provider or what is the name of the office in which the patient attends annual eye exams? [facility] If pt is not established with a provider, would they like to be referred to a provider to establish care? No .   Dental Screening: Recommended annual dental exams for proper oral hygiene  Community Resource Referral / Chronic Care Management: CRR required this visit?  No   CCM required this visit?  No      Plan:     I have personally reviewed and noted the following in the patient's chart:    Medical and social history Use of alcohol, tobacco or illicit drugs  Current medications and supplements including opioid prescriptions. Patient is not currently taking opioid prescriptions. Functional ability and status Nutritional status Physical activity Advanced directives List of other physicians Hospitalizations, surgeries, and ER visits in previous 12 months Vitals Screenings to include cognitive, depression, and falls Referrals and appointments  In addition, I have reviewed and discussed with patient certain preventive protocols, quality metrics, and best practice recommendations. A written personalized care plan for preventive services as well as general preventive health recommendations were provided to patient.     Remi Haggard, LPN   [id number]/[id number]   Nurse Notes:

## 2023-05-21 DIAGNOSIS — H40033 Anatomical narrow angle, bilateral: Secondary | ICD-10-CM | POA: Diagnosis not present

## 2023-05-21 DIAGNOSIS — H2513 Age-related nuclear cataract, bilateral: Secondary | ICD-10-CM | POA: Diagnosis not present

## 2023-05-21 DIAGNOSIS — H524 Presbyopia: Secondary | ICD-10-CM | POA: Diagnosis not present

## 2023-05-30 ENCOUNTER — Other Ambulatory Visit: Payer: Self-pay | Admitting: Family Medicine

## 2023-05-30 DIAGNOSIS — M1712 Unilateral primary osteoarthritis, left knee: Secondary | ICD-10-CM

## 2023-06-01 ENCOUNTER — Ambulatory Visit (HOSPITAL_COMMUNITY): Payer: Medicare Other | Attending: Cardiology

## 2023-06-01 DIAGNOSIS — R7989 Other specified abnormal findings of blood chemistry: Secondary | ICD-10-CM | POA: Diagnosis not present

## 2023-06-01 DIAGNOSIS — R600 Localized edema: Secondary | ICD-10-CM | POA: Diagnosis not present

## 2023-06-01 LAB — ECHOCARDIOGRAM COMPLETE
Area-P 1/2: 4.04 cm2
S' Lateral: 2.9 cm

## 2023-06-05 ENCOUNTER — Telehealth: Payer: Self-pay

## 2023-06-05 DIAGNOSIS — D485 Neoplasm of uncertain behavior of skin: Secondary | ICD-10-CM | POA: Diagnosis not present

## 2023-06-05 DIAGNOSIS — L820 Inflamed seborrheic keratosis: Secondary | ICD-10-CM | POA: Diagnosis not present

## 2023-06-05 DIAGNOSIS — D225 Melanocytic nevi of trunk: Secondary | ICD-10-CM | POA: Diagnosis not present

## 2023-06-05 DIAGNOSIS — L821 Other seborrheic keratosis: Secondary | ICD-10-CM | POA: Diagnosis not present

## 2023-06-05 DIAGNOSIS — X32XXXA Exposure to sunlight, initial encounter: Secondary | ICD-10-CM | POA: Diagnosis not present

## 2023-06-05 DIAGNOSIS — L570 Actinic keratosis: Secondary | ICD-10-CM | POA: Diagnosis not present

## 2023-06-05 DIAGNOSIS — C44619 Basal cell carcinoma of skin of left upper limb, including shoulder: Secondary | ICD-10-CM | POA: Diagnosis not present

## 2023-06-05 DIAGNOSIS — C44519 Basal cell carcinoma of skin of other part of trunk: Secondary | ICD-10-CM | POA: Diagnosis not present

## 2023-06-05 NOTE — Telephone Encounter (Signed)
-----   Message from Sheliah Hatch, MD sent at 06/04/2023  4:25 PM EDT ----- Normal ultrasound of your heart.  This is great news!

## 2023-06-05 NOTE — Telephone Encounter (Signed)
Pt aware of Results

## 2023-06-07 ENCOUNTER — Ambulatory Visit (INDEPENDENT_AMBULATORY_CARE_PROVIDER_SITE_OTHER): Payer: Medicare Other | Admitting: Family Medicine

## 2023-06-07 ENCOUNTER — Encounter: Payer: Self-pay | Admitting: Family Medicine

## 2023-06-07 VITALS — BP 132/80 | HR 80 | Temp 97.9°F | Resp 16 | Ht 69.0 in | Wt 207.1 lb

## 2023-06-07 DIAGNOSIS — M1A9XX0 Chronic gout, unspecified, without tophus (tophi): Secondary | ICD-10-CM | POA: Diagnosis not present

## 2023-06-07 MED ORDER — PREDNISONE 10 MG PO TABS: ORAL_TABLET | ORAL | 0 refills | Status: DC

## 2023-06-07 NOTE — Progress Notes (Signed)
   Subjective:    Patient ID: [name], male    DOB: 09/22/1946, 77 y.o.   MRN: [id number]  HPI Gout- pt reports R hand started swelling on Monday.  Is not able to close his fist or grip anything.  Pt reports hand sxs are consistent w/ previous gout flares.  Taking Tylenol Arthritis w/o relief.  Currently on 2 pills of Allopurinol daily and legs feel much better.   Review of Systems For ROS see HPI     Objective:   Physical Exam Vitals reviewed.  Constitutional:      General: He is not in acute distress.    Appearance: Normal appearance. He is not ill-appearing.  Musculoskeletal:        General: Swelling (swelling of R PIP joints w/ TTP) and deformity present.     Right lower leg: No edema.     Left lower leg: No edema.  Skin:    General: Skin is warm and dry.  Neurological:     General: No focal deficit present.     Mental Status: He is alert and oriented to person, place, and time.     Motor: Weakness (unable to form a fist w/ R hand or grip) present.  Psychiatric:        Mood and Affect: Mood normal.        Behavior: Behavior normal.        Thought Content: Thought content normal.           Assessment & Plan:

## 2023-06-07 NOTE — Assessment & Plan Note (Signed)
Ongoing issue.  Feet and ankles have resolved but now has symptoms in R hand.  Taking 2 Allopurinol daily.  Will restart Prednisone taper.  Reviewed supportive care and red flags that should prompt return.  Pt expressed understanding and is in agreement w/ plan.

## 2023-06-07 NOTE — Patient Instructions (Signed)
Follow up as needed or as scheduled START the Prednisone as directed- 3 pills at the same time x3 days, then 2 pills at the same time x3 days, then 1 pill daily.  Take w/ food  Drink LOTS of water Continue the Allopurinol 2 tabs daily Call with any questions or concerns Hang in there! Have a great summer!!!

## 2023-06-26 ENCOUNTER — Ambulatory Visit (INDEPENDENT_AMBULATORY_CARE_PROVIDER_SITE_OTHER): Payer: Medicare Other | Admitting: Family Medicine

## 2023-06-26 ENCOUNTER — Other Ambulatory Visit: Payer: Self-pay | Admitting: Family Medicine

## 2023-06-26 VITALS — BP 138/76 | HR 62 | Temp 98.6°F | Ht 69.0 in | Wt 209.4 lb

## 2023-06-26 DIAGNOSIS — I10 Essential (primary) hypertension: Secondary | ICD-10-CM

## 2023-06-26 DIAGNOSIS — M7989 Other specified soft tissue disorders: Secondary | ICD-10-CM

## 2023-06-26 DIAGNOSIS — M1A9XX0 Chronic gout, unspecified, without tophus (tophi): Secondary | ICD-10-CM | POA: Diagnosis not present

## 2023-06-26 DIAGNOSIS — R238 Other skin changes: Secondary | ICD-10-CM | POA: Diagnosis not present

## 2023-06-26 MED ORDER — PREDNISONE 10 MG PO TABS
ORAL_TABLET | ORAL | 0 refills | Status: AC
Start: 2023-06-26 — End: 2023-07-01

## 2023-06-26 NOTE — Progress Notes (Signed)
Acute Office Visit   Subjective:  Patient ID: [name], male    DOB: [birth year], 77 y.o.   MRN: [id number]  Chief Complaint  Patient presents with   Gout    Pt has gout of the Rt wrist notes a lot of pain starting Saturday, notes a lot of swelling     HPI Patient is a 77 year old male that presents with right wrist pain and swelling. He reports he thinks the swelling came first. Denies any recent injury. He thinks it is gout since has a history. Currently, he takes Allopurinol 200mg  daily.   He reports symptoms started on Thursday. Then, Friday symptoms became even worse.   He was seen on 06/07/2023 for gout in the right hand. He was prescribed Prednisone dose pack and continue Allopurinol 2 tablets daily. He reports the pain and swelling improved with taking Prednisone. He denies eating any foods that has caused an increase in gout.   He has been prescribed Cholchine in the past, but does not remember about the medication.   ROS See HPI above      Objective:   BP 138/76   Pulse 62   Temp 98.6 F (37 C) (Temporal)   Ht 5\' 9"  (1.753 m)   Wt 209 lb 6.4 oz (95 kg)   SpO2 98%   BMI 30.92 kg/m    Physical Exam Vitals reviewed.  Constitutional:      General: He is not in acute distress.    Appearance: Normal appearance. He is not ill-appearing, toxic-appearing or diaphoretic.  Eyes:     General:        Right eye: No discharge.        Left eye: No discharge.     Conjunctiva/sclera: Conjunctivae normal.  Cardiovascular:     Rate and Rhythm: Normal rate.     Heart sounds: Normal heart sounds. No murmur heard.    No friction rub. No gallop.  Pulmonary:     Effort: Pulmonary effort is normal. No respiratory distress.     Breath sounds: Normal breath sounds.  Musculoskeletal:        General: Swelling (Right hand, wrist with redness.) present. Normal range of motion.     Comments: Limited ability to make fist with right hand due to swelling. He is able to move right  wrist.   Skin:    General: Skin is warm and dry.  Neurological:     General: No focal deficit present.     Mental Status: He is alert and oriented to person, place, and time. Mental status is at baseline.  Psychiatric:        Mood and Affect: Mood normal.        Behavior: Behavior normal.        Thought Content: Thought content normal.        Judgment: Judgment normal.           Assessment & Plan:  Chronic gout without tophus, unspecified cause, unspecified site -     Uric acid -     predniSONE; Take 6 tablets (60 mg total) by mouth daily with breakfast for 1 day, THEN 5 tablets (50 mg total) daily with breakfast for 1 day, THEN 4 tablets (40 mg total) daily with breakfast for 1 day, THEN 3 tablets (30 mg total) daily with breakfast for 1 day, THEN 2 tablets (20 mg total) daily with breakfast for 1 day, THEN 1 tablet (10 mg total) daily with breakfast  for 1 day.  Dispense: 21 tablet; Refill: 0  Redness and swelling of hand -     CBC with Differential/Platelet   -Prescribed Prednisone 10mg  tablet, 6 day taper. Recommend to take with food and do take additional NSAIDS, like Ibuprofen, Advil, Aleve, or Naproxen.  -Advised he can continue to take Tylenol to help with pain.  -Drink LOTS of water -Continue the Allopurinol 2 tabs daily -Ordered uric acid level and CBC to evaluate uric acid level since this is his second time recently of having gout and having redness. -If  symptoms do not improve, follow up.  -Patient expressed understanding of treatment plan and agrees with plan of care.    Zandra Abts, NP

## 2023-06-26 NOTE — Patient Instructions (Addendum)
Follow up as needed or as scheduled START Prednisone 10mg  tablet, 6 day taper. Recommend to take with food and do take additional NSAIDS, like Ibuprofen, Advil, Aleve, or Naproxen.  -You can continue to take Tylenol to help with pain.  -Drink LOTS of water -Continue the Allopurinol 2 tabs daily -Ordered uric acid level and CBC, office will call with lab results and you may see them on MyChart.  -If  symptoms do not improve, follow up.

## 2023-06-27 ENCOUNTER — Telehealth: Payer: Self-pay

## 2023-06-27 ENCOUNTER — Ambulatory Visit: Payer: Medicare Other | Admitting: Family Medicine

## 2023-06-27 LAB — CBC WITH DIFFERENTIAL/PLATELET
Basophils Absolute: 0.1 10*3/uL (ref 0.0–0.1)
Basophils Relative: 0.8 % (ref 0.0–3.0)
Eosinophils Absolute: 0.2 10*3/uL (ref 0.0–0.7)
Eosinophils Relative: 2.4 % (ref 0.0–5.0)
HCT: 42.3 % (ref 39.0–52.0)
Hemoglobin: 13.5 g/dL (ref 13.0–17.0)
Lymphocytes Relative: 17.2 % (ref 12.0–46.0)
Lymphs Abs: 1.3 10*3/uL (ref 0.7–4.0)
MCHC: 31.9 g/dL (ref 30.0–36.0)
MCV: 85.1 fl (ref 78.0–100.0)
Monocytes Absolute: 0.7 10*3/uL (ref 0.1–1.0)
Monocytes Relative: 9.5 % (ref 3.0–12.0)
Neutro Abs: 5.4 10*3/uL (ref 1.4–7.7)
Neutrophils Relative %: 70.1 % (ref 43.0–77.0)
Platelets: 197 10*3/uL (ref 150.0–400.0)
RBC: 4.97 Mil/uL (ref 4.22–5.81)
RDW: 15.2 % (ref 11.5–15.5)
WBC: 7.7 10*3/uL (ref 4.0–10.5)

## 2023-06-27 LAB — URIC ACID: Uric Acid, Serum: 4.4 mg/dL (ref 4.0–7.8)

## 2023-06-27 NOTE — Telephone Encounter (Signed)
I have informed pt of results . He states the swelling has got better since yesterday . He thinks the prednisone is working

## 2023-06-27 NOTE — Telephone Encounter (Signed)
-----   Message from Alveria Apley, NP sent at 06/27/2023  1:17 PM EDT ----- Your uric acid and CBC are normal. Based on labs, it is not gout. If Prednisone does not help or becomes worse, you need to follow up sooner.

## 2023-07-03 ENCOUNTER — Ambulatory Visit: Payer: Medicare Other | Admitting: Family Medicine

## 2023-07-06 ENCOUNTER — Encounter: Payer: Self-pay | Admitting: Family Medicine

## 2023-07-06 ENCOUNTER — Ambulatory Visit (INDEPENDENT_AMBULATORY_CARE_PROVIDER_SITE_OTHER): Payer: Medicare Other | Admitting: Family Medicine

## 2023-07-06 DIAGNOSIS — M1A9XX0 Chronic gout, unspecified, without tophus (tophi): Secondary | ICD-10-CM

## 2023-07-06 MED ORDER — ALLOPURINOL 100 MG PO TABS
300.0000 mg | ORAL_TABLET | Freq: Every day | ORAL | Status: DC
Start: 2023-07-06 — End: 2023-08-13

## 2023-07-06 MED ORDER — PREDNISONE 10 MG PO TABS
ORAL_TABLET | ORAL | 0 refills | Status: AC
Start: 2023-07-06 — End: 2023-07-11

## 2023-07-06 NOTE — Progress Notes (Signed)
Acute Office Visit   Subjective:  Patient ID: [name], male    DOB: 02-Dec-1946, 77 y.o.   MRN: [id number]  Chief Complaint  Patient presents with   swollen hand    Pt is here today with swollen left hand Pt repots sx started 2x days  Pt reports pain and no grip.     HPI  Patient is a 77 year old man that complains of his right hand swelling. He has been seen on 06/20 and 07/09 for right hand swelling, diagnosed with gout. He was prescribed Prednisone at both appointments with improvement in swelling. At his last appointment, his uric acid and CBC was normal.   Today, reports about 4 days after taking Prednisone the swelling and pain started back in the right hand. Swelling is present, but not as bad as last time.   He reports he is drinking plenty of water, last beer was at July 4th.   He is taking Allopurinol 100mg  tablet, taking 2 tablets a day.   ROS See HPI above      Objective:   BP (!) 150/80   Pulse 98   Temp 98.7 F (37.1 C)   Ht 5\' 9"  (1.753 m)   Wt 208 lb (94.3 kg)   SpO2 98%   BMI 30.72 kg/m   Physical Exam Vitals reviewed.  Constitutional:      General: He is not in acute distress.    Appearance: Normal appearance. He is not ill-appearing, toxic-appearing or diaphoretic.  Eyes:     General:        Right eye: No discharge.        Left eye: No discharge.     Conjunctiva/sclera: Conjunctivae normal.  Cardiovascular:     Rate and Rhythm: Normal rate.   Pulmonary:     Effort: Pulmonary effort is normal. No respiratory distress.  Musculoskeletal:        General: Swelling (Right hand.) present. Normal range of motion.     Comments: Limited ability to make fist with right hand due to swelling. He is able to move right wrist.   Skin:    General: Skin is warm and dry.  Neurological:     General: No focal deficit present.     Mental Status: He is alert and oriented to person, place, and time. Mental status is at baseline.  Psychiatric:         Mood and Affect: Mood normal.        Behavior: Behavior normal.        Thought Content: Thought content normal.        Judgment: Judgment normal.      Assessment & Plan:  Chronic gout without tophus, unspecified cause, unspecified site -     Allopurinol; Take 3 tablets (300 mg total) by mouth daily. -     predniSONE; Take 6 tablets (60 mg total) by mouth daily with breakfast for 1 day, THEN 5 tablets (50 mg total) daily with breakfast for 1 day, THEN 4 tablets (40 mg total) daily with breakfast for 1 day, THEN 3 tablets (30 mg total) daily with breakfast for 1 day, THEN 2 tablets (20 mg total) daily with breakfast for 1 day, THEN 1 tablet (10 mg total) daily with breakfast for 1 day.  Dispense: 21 tablet; Refill: 0  -Prescribed Prednisone 10mg  tablet, 6 day taper. Recommend to take with food and do take additional NSAIDS, like Ibuprofen, Advil, Aleve, or Naproxen.  -Advised he can  continue to take Tylenol to help with pain.  -Drink LOTS of water -INCREASE the Allopurinol to 3 tablets (equals 300mg ) a day. Call the office when you need a refill for 300 mg daily.  -If  symptoms do not improve, follow up.  -Provided written material about a low-purine eating plan.    Zandra Abts, NP

## 2023-07-06 NOTE — Patient Instructions (Signed)
-  Prescribed Prednisone 10mg  tablet, 6 day taper. Recommend to take with food and do take additional NSAIDS, like Ibuprofen, Advil, Aleve, or Naproxen.  -Advised he can continue to take Tylenol to help with pain.  -Drink LOTS of water -INCREASE the Allopurinol to 3 tablets (equals 300mg ) a day. Call the office when you need a refill for 300 mg daily.  -If  symptoms do not improve, follow up.

## 2023-07-17 DIAGNOSIS — L820 Inflamed seborrheic keratosis: Secondary | ICD-10-CM | POA: Diagnosis not present

## 2023-07-17 DIAGNOSIS — Z08 Encounter for follow-up examination after completed treatment for malignant neoplasm: Secondary | ICD-10-CM | POA: Diagnosis not present

## 2023-07-17 DIAGNOSIS — Z85828 Personal history of other malignant neoplasm of skin: Secondary | ICD-10-CM | POA: Diagnosis not present

## 2023-07-17 DIAGNOSIS — X32XXXD Exposure to sunlight, subsequent encounter: Secondary | ICD-10-CM | POA: Diagnosis not present

## 2023-07-17 DIAGNOSIS — L570 Actinic keratosis: Secondary | ICD-10-CM | POA: Diagnosis not present

## 2023-07-31 ENCOUNTER — Encounter (INDEPENDENT_AMBULATORY_CARE_PROVIDER_SITE_OTHER): Payer: Self-pay | Admitting: Gastroenterology

## 2023-07-31 ENCOUNTER — Ambulatory Visit (INDEPENDENT_AMBULATORY_CARE_PROVIDER_SITE_OTHER): Payer: Medicare Other | Admitting: Gastroenterology

## 2023-07-31 VITALS — BP 152/85 | HR 67 | Temp 97.8°F | Ht 72.0 in | Wt 208.2 lb

## 2023-07-31 DIAGNOSIS — R140 Abdominal distension (gaseous): Secondary | ICD-10-CM

## 2023-07-31 DIAGNOSIS — R152 Fecal urgency: Secondary | ICD-10-CM

## 2023-07-31 NOTE — Patient Instructions (Signed)
You can try adding benefiber 1T 2-3 times per day with a meal, this is just additional fiber as you may not be getting enough in your diet. This will help to bulk up your stool some if you are still feeling some urgency If you are having more gas/bloating, you can try IB gard or simethicone over the counter I am also providing the low FODMAP guide as this can be helpful in avoiding certain foods that tend to cause more GI issues  Please let me know if symptoms worsen  Follow up 6 months   It was a pleasure to see you today. I want to create trusting relationships with patients and provide genuine, compassionate, and quality care. I truly value your feedback! please be on the lookout for a survey regarding your visit with me today. I appreciate your input about our visit and your time in completing this!    Steven Ohms L. Jeanmarie Hubert, MSN, APRN, AGNP-C Adult-Gerontology Nurse Practitioner [facility] at [facility]

## 2023-07-31 NOTE — Progress Notes (Signed)
Referring Provider: Sheliah Hatch, MD Primary Care Physician:  Sheliah Hatch, MD Primary GI Physician: Dr. Levon Hedger   Chief Complaint  Patient presents with   Follow-up    Patient here today for a follow up on abdominal pain. Patient says he still has pain that comes and goes. Plan for last time 1. Schedule colonoscopy ASA II 2.  Continue with probiotic  3. Celiac panel, CRP  4. Low FODMAP food guide  5. Start otc IB gard  Tcs went well per patient he has not changed his diet and is not taking a probiotic nor the Ib guard.   HPI:   [name] with past medical history of  arthritis, colon polyps, gout, HTN, scoliosis.   Patient presenting today for follow up of change in stool caliber, increased frequency/urgency and bloating  Last seen April 2024, at that time having smaller sized/squiggly stools, flatulence, some bloating. Having to strain to defecate.   Recommended to schedule colonoscopy, continue probiotic, celiac panel, low FODMAP food guide, start IB gard.  Celiac panel negative, CRP mildly elevated at 16.7 in April   Present: Patient states that he is doing pretty good. He is still having 2-3 BMs per day, still somewhat skinny as before. He notes he is having some bloating and a sensation of gas moving but if he is able to pass gas or have a BM, this sensation feels better. Denies heartburn or acid regurgitation. No nausea or vomiting. Appetite is good. Weight is stable. He did not try benefiber as previously recommended. He tried the probiotic a while back but did not see much improvement so he stopped it. He does not think he tried IB guard. He denies any fecal incontinence but notes sometimes he has to be very mindful if he is going to pass gas or stool.  No rectal bleeding or melena. He is unsure if he looked at low FODMAP guide last time. Often eats a honey bun or nutter butter cookies. He likes [place] food as well. Appetite is good and  weight is stable. Overall he feels pretty good.   Last Colonoscopy:-04/2023 Two 3 to 6 mm polyps in the ascending colon and                            in the cecum, removed with a cold snare. Resected                            and retrieved.                           - Two 2 mm polyps in the rectum and in the sigmoid                            colon, removed with a cold snare. Resected and                            retrieved.                           - The rest of the colon examined colon is normal.  Biopsied.                           - Non-bleeding internal hemorrhoids.  4 TAs, normal random colonic biopsies  Recommendations:  Repeat in 5 years   Past Medical History:  Diagnosis Date   Arthritis    Cellulitis and abscess of other specified site    Colon polyps    Gout    Hypertension    Neuromuscular disorder (HCC)    arthritis   Scoliosis     Past Surgical History:  Procedure Laterality Date   BACK SURGERY     BIOPSY  05/02/2023   Procedure: BIOPSY;  Surgeon: Dolores Frame, MD;  [facility];  Service: Gastroenterology;;   COLONOSCOPY  01/10/2013   Procedure: COLONOSCOPY;  Surgeon: Malissa Hippo, MD;  [facility];  Service: Endoscopy;  Laterality: N/A;  730   COLONOSCOPY N/A 08/21/2013   Procedure: COLONOSCOPY;  Surgeon: Malissa Hippo, MD;  [facility];  Service: Endoscopy;  Laterality: N/A;  1030   COLONOSCOPY N/A 08/26/2015   Procedure: COLONOSCOPY;  Surgeon: Malissa Hippo, MD;  [facility];  Service: Endoscopy;  Laterality: N/A;  240   COLONOSCOPY WITH PROPOFOL N/A 03/16/2021   Procedure: COLONOSCOPY WITH PROPOFOL;  Surgeon: Malissa Hippo, MD;  [facility];  Service: Endoscopy;  Laterality: N/A;  patient knows to arrive at 0630.   COLONOSCOPY WITH PROPOFOL N/A 05/02/2023   Procedure: COLONOSCOPY WITH PROPOFOL;  Surgeon: Dolores Frame, MD;  [facility];  Service: Gastroenterology;  Laterality: N/A;  1:30PM ASA 2   colonscopy     PARTIAL KNEE ARTHROPLASTY Left 11/06/2017   Procedure: UNICOMPARTMENTAL KNEE;  Surgeon: Sheral Apley, MD;  Location: [facility] OR;  Service: Orthopedics;  Laterality: Left;   POLYPECTOMY  05/02/2023   Procedure: POLYPECTOMY INTESTINAL;  Surgeon: Marguerita Merles, Reuel Boom, MD;  [facility];  Service: Gastroenterology;;    Current Outpatient Medications  Medication Sig Dispense Refill   acetaminophen (TYLENOL) 650 MG CR tablet Take 1,300 mg by mouth every 8 (eight) hours as needed for pain.     allopurinol (ZYLOPRIM) 100 MG tablet Take 3 tablets (300 mg total) by mouth daily.     aspirin EC 81 MG tablet Take 81 mg by mouth daily. Swallow whole.     diclofenac (VOLTAREN) 75 MG EC tablet TAKE 1 TABLET 2 TIMES A DAY AS NEEDED 60 tablet 0   EPINEPHrine 0.3 mg/0.3 mL IJ SOAJ injection Inject 0.3 mg into the muscle as needed for anaphylaxis.  0   gabapentin (NEURONTIN) 300 MG capsule Take 1 capsule (300 mg total) by mouth 2 (two) times daily as needed (pain). (Patient taking differently: Take 300 mg by mouth daily.) 180 capsule 1   lisinopril (ZESTRIL) 10 MG tablet Take 1 tablet (10 mg total) by mouth daily. 90 tablet 3   metoprolol succinate (TOPROL-XL) 25 MG 24 hr tablet TAKE ONE TABLET ONCE DAILY 90 tablet 0   oxymetazoline (AFRIN) 0.05 % nasal spray Place 1 spray into both nostrils 2 (two) times daily as needed for congestion.     simvastatin (ZOCOR) 40 MG tablet TAKE ONE TABLET DAILY 90 tablet 1   PROBIOTIC, LACTOBACILLUS, PO Take 1 capsule by mouth daily. (Patient not taking: Reported on 07/31/2023)     No current facility-administered medications for this visit.    Allergies as of 07/31/2023 -  Review Complete 07/31/2023  Allergen Reaction Noted   Bee venom Anaphylaxis 08/13/2015    Family History  Problem Relation Age of Onset   Kidney disease Sister    Cancer Other        Family Hx of Cancer,  CAD,Diabetes,Kidney Failure   Colon cancer Neg Hx     Social History   Socioeconomic History   Marital status: Married    Spouse name: Not on file   Number of children: 2   Years of education: Not on file   Highest education level: 12th grade  Occupational History   Occupation: Retired     Comment: Music therapist facility  Tobacco Use   Smoking status: Former    Current packs/day: 0.00    Average packs/day: 3.0 packs/day for 39.0 years (117.0 ttl pk-yrs)    Types: Cigarettes    Start date: 12/18/1957    Quit date: 12/18/1996    Years since quitting: 26.6    Passive exposure: Current   Smokeless tobacco: Never   Tobacco comments:    started smoking as a child  Vaping Use   Vaping status: Never Used  Substance and Sexual Activity   Alcohol use: Yes    Alcohol/week: 0.0 standard drinks of alcohol    Comment: beer occasionally   Drug use: No   Sexual activity: Yes  Other Topics Concern   Not on file  Social History Narrative   2 sons; 4 Grandchildren       Enjoys fishing    Social Determinants of Corporate investment banker Strain: Low Risk  (06/25/2023)   Overall Financial Resource Strain (CARDIA)    Difficulty of Paying Living Expenses: Not hard at all  Food Insecurity: No Food Insecurity (06/25/2023)   Hunger Vital Sign    Worried About Running Out of Food in the Last Year: Never true    Ran Out of Food in the Last Year: Never true  Transportation Needs: No Transportation Needs (06/25/2023)   PRAPARE - Administrator, Civil Service (Medical): No    Lack of Transportation (Non-Medical): No  Physical Activity: Insufficiently Active (06/25/2023)   Exercise Vital Sign    Days of Exercise per Week: 3 days    Minutes of Exercise per Session: 10 min  Stress: No Stress Concern Present (06/25/2023)   [employer] of Occupational Health - Occupational Stress Questionnaire    Feeling of Stress : Not at all  Social Connections: Unknown (06/25/2023)   Social  Connection and Isolation Panel [NHANES]    Frequency of Communication with Friends and Family: Three times a week    Frequency of Social Gatherings with Friends and Family: More than three times a week    Attends Religious Services: Patient declined    Database administrator or Organizations: No    Attends Engineer, structural: Never    Marital Status: Married    Review of systems General: negative for malaise, night sweats, fever, chills, weight loss Neck: Negative for lumps, goiter, pain and significant neck swelling Resp: Negative for cough, wheezing, dyspnea at rest CV: Negative for chest pain, leg swelling, palpitations, orthopnea GI: denies melena, hematochezia, nausea, vomiting, diarrhea, constipation, dysphagia, odyonophagia, early satiety or unintentional weight loss. +increased frequency/urgency of BMs  MSK: Negative for joint pain or swelling, back pain, and muscle pain. Derm: Negative for itching or rash Psych: Denies depression, anxiety, memory loss, confusion. No homicidal or suicidal ideation.  Heme: Negative for prolonged bleeding, bruising easily,  and swollen nodes. Endocrine: Negative for cold or heat intolerance, polyuria, polydipsia and goiter. Neuro: negative for tremor, gait imbalance, syncope and seizures. The remainder of the review of systems is noncontributory.  Physical Exam: BP (!) 152/85 (BP Location: Left Arm, Patient Position: Sitting, Cuff Size: Normal)   Pulse 67   Temp 97.8 F (36.6 C) (Temporal)   Ht 6' (1.829 m)   Wt 208 lb 3.2 oz (94.4 kg)   BMI 28.24 kg/m  General:   Alert and oriented. No distress noted. Pleasant and cooperative.  Head:  Normocephalic and atraumatic. Eyes:  Conjuctiva clear without scleral icterus. Mouth:  Oral mucosa pink and moist. Good dentition. No lesions. Heart: Normal rate and rhythm, s1 and s2 heart sounds present.  Lungs: Clear lung sounds in all lobes. Respirations equal and unlabored. Abdomen:  +BS,  soft, non-tender and non-distended. No rebound or guarding. No HSM or masses noted. Derm: No palmar erythema or jaundice Msk:  Symmetrical without gross deformities. Normal posture. Extremities:  Without edema. Neurologic:  Alert and  oriented x4 Psych:  Alert and cooperative. Normal mood and affect.  Invalid input(s): "6 MONTHS"   ASSESSMENT: [name] is a 77 y.o. Bean presenting today for follow up of change in stool caliber, increased frequency/urgency and bloating  Change in stool caliber/frequency/urgency: Colonoscopy in May with a few polyps. He notes continued 2-3 BMs per day with some urgency, stools still softer/skinnier. No fecal incontinence, rectal bleeding or melena. He did not try adding benefiber as previously recommended, does not think he tried LOW fodmap food guide either. Reassuringly he has no abdominal pain or weight loss.  He feels he is doing pretty well and notes certain foods tend to make him have more fecal urgency but he does not plan to avoid the foods he likes. Recommend again trying low FODMAP diet, start benefiber 1T BID-TID.   Bloating/gas: celiac panel negative. Tried probiotic without improvement. Did not try IB guard. Notes he can pass gas or have a BM and sensation will improve.  He has no early satiety, nausea, vomiting or changes in appetite. Denies GERD symptoms. Again, recommended low FODMAP guide as above, can try IB gard or simethicone over the counter.   PLAN:  Low FODMAP guide  2. Repeat Colonoscopy in 5 years 3. Pt to make me aware if symptoms worsen 4. Benefiber 1T BID-TID 5. IB gard or simethicone   All questions were answered, patient verbalized understanding and is in agreement with plan as outlined above.   Follow Up: 6  months   Steven Pecore L. Jeanmarie Hubert, MSN, APRN, AGNP-C Adult-Gerontology Nurse Practitioner [facility]

## 2023-08-02 ENCOUNTER — Other Ambulatory Visit: Payer: Self-pay | Admitting: Family Medicine

## 2023-08-02 DIAGNOSIS — G8929 Other chronic pain: Secondary | ICD-10-CM

## 2023-08-13 ENCOUNTER — Other Ambulatory Visit: Payer: Self-pay | Admitting: Family Medicine

## 2023-08-13 DIAGNOSIS — M1A9XX0 Chronic gout, unspecified, without tophus (tophi): Secondary | ICD-10-CM

## 2023-08-13 MED ORDER — ALLOPURINOL 100 MG PO TABS
300.0000 mg | ORAL_TABLET | Freq: Every day | ORAL | 2 refills | Status: DC
Start: 2023-08-13 — End: 2024-01-08

## 2023-08-13 NOTE — Telephone Encounter (Signed)
Encourage patient to contact the pharmacy for refills or they can request refills through [facility]  (Please schedule appointment if patient has not been seen in over a year)    WHAT PHARMACY WOULD THEY LIKE THIS SENT TO: [facility] [place] [address]   MEDICATION NAME & DOSE: allopurinol (ZYLOPRIM) 100 MG tablet   NOTES/COMMENTS FROM PATIENT: Pt was advised by Mardene Celeste to call when he needed a refill on this medication due to it being increased from 2 tablets to 3 tablets. Pt states 3 tablets seem to be working better.     Front office please notify patient: It takes 48-72 hours to process rx refill requests Ask patient to call pharmacy to ensure rx is ready before heading there.

## 2023-08-13 NOTE — Telephone Encounter (Signed)
Pt seen [name] and she advised to call Dr Beverely Low when refills are needed  Is ok to increase the Allopurinol to 3 tablets

## 2023-08-13 NOTE — Telephone Encounter (Signed)
Spoke to pt's wife told her will send Rx to the pharmacy. She will let pt know. Rx sent.

## 2023-08-22 DIAGNOSIS — M79641 Pain in right hand: Secondary | ICD-10-CM | POA: Insufficient documentation

## 2023-08-24 DIAGNOSIS — M109 Gout, unspecified: Secondary | ICD-10-CM | POA: Diagnosis not present

## 2023-08-24 DIAGNOSIS — M79641 Pain in right hand: Secondary | ICD-10-CM | POA: Diagnosis not present

## 2023-09-25 ENCOUNTER — Ambulatory Visit (INDEPENDENT_AMBULATORY_CARE_PROVIDER_SITE_OTHER): Payer: Medicare Other | Admitting: Family Medicine

## 2023-09-25 ENCOUNTER — Encounter: Payer: Self-pay | Admitting: Family Medicine

## 2023-09-25 VITALS — BP 134/62 | HR 67 | Temp 98.2°F | Ht 68.5 in | Wt 210.8 lb

## 2023-09-25 DIAGNOSIS — Z125 Encounter for screening for malignant neoplasm of prostate: Secondary | ICD-10-CM | POA: Diagnosis not present

## 2023-09-25 DIAGNOSIS — I10 Essential (primary) hypertension: Secondary | ICD-10-CM | POA: Diagnosis not present

## 2023-09-25 DIAGNOSIS — Z0000 Encounter for general adult medical examination without abnormal findings: Secondary | ICD-10-CM

## 2023-09-25 LAB — HEPATIC FUNCTION PANEL
ALT: 13 U/L (ref 0–53)
AST: 12 U/L (ref 0–37)
Albumin: 4.2 g/dL (ref 3.5–5.2)
Alkaline Phosphatase: 92 U/L (ref 39–117)
Bilirubin, Direct: 0.2 mg/dL (ref 0.0–0.3)
Total Bilirubin: 0.9 mg/dL (ref 0.2–1.2)
Total Protein: 6.2 g/dL (ref 6.0–8.3)

## 2023-09-25 LAB — BASIC METABOLIC PANEL
BUN: 18 mg/dL (ref 6–23)
CO2: 29 meq/L (ref 19–32)
Calcium: 9.5 mg/dL (ref 8.4–10.5)
Chloride: 105 meq/L (ref 96–112)
Creatinine, Ser: 0.91 mg/dL (ref 0.40–1.50)
GFR: 82.85 mL/min (ref >60.00)
Glucose, Bld: 83 mg/dL (ref 70–99)
Potassium: 4.1 meq/L (ref 3.5–5.1)
Sodium: 143 meq/L (ref 135–145)

## 2023-09-25 LAB — CBC WITH DIFFERENTIAL/PLATELET
Basophils Absolute: 0.1 10*3/uL (ref 0.0–0.1)
Basophils Relative: 0.8 % (ref 0.0–3.0)
Eosinophils Absolute: 0.1 10*3/uL (ref 0.0–0.7)
Eosinophils Relative: 2 % (ref 0.0–5.0)
HCT: 44.4 % (ref 39.0–52.0)
Hemoglobin: 14.1 g/dL (ref 13.0–17.0)
Lymphocytes Relative: 21.6 % (ref 12.0–46.0)
Lymphs Abs: 1.4 10*3/uL (ref 0.7–4.0)
MCHC: 31.8 g/dL (ref 30.0–36.0)
MCV: 85 fL (ref 78.0–100.0)
Monocytes Absolute: 0.7 10*3/uL (ref 0.1–1.0)
Monocytes Relative: 10.5 % (ref 3.0–12.0)
Neutro Abs: 4.3 10*3/uL (ref 1.4–7.7)
Neutrophils Relative %: 65.1 % (ref 43.0–77.0)
Platelets: 207 10*3/uL (ref 150.0–400.0)
RBC: 5.22 Mil/uL (ref 4.22–5.81)
RDW: 14.4 % (ref 11.5–15.5)
WBC: 6.5 10*3/uL (ref 4.0–10.5)

## 2023-09-25 LAB — PSA, MEDICARE: PSA: 0.52 ng/mL (ref 0.10–4.00)

## 2023-09-25 LAB — LIPID PANEL
Cholesterol: 111 mg/dL (ref 0–200)
HDL: 35.8 mg/dL — ABNORMAL LOW (ref >39.00)
LDL Cholesterol: 45 mg/dL (ref 0–99)
NonHDL: 74.72
Total CHOL/HDL Ratio: 3
Triglycerides: 150 mg/dL — ABNORMAL HIGH (ref 0.0–149.0)
VLDL: 30 mg/dL (ref 0.0–40.0)

## 2023-09-25 LAB — TSH: TSH: 1.43 u[IU]/mL (ref 0.35–5.50)

## 2023-09-25 NOTE — Patient Instructions (Signed)
Follow up in 6 months to recheck BP and cholesterol We'll notify you of your lab results and make any changes if needed Continue to work on healthy diet and regular exercise- you can do it! Call with any questions or concerns Stay Safe!  Stay Healthy! Happy Fall!!

## 2023-09-25 NOTE — Assessment & Plan Note (Signed)
Pt's PE WNL w/ exception of BMI.  UTD on colonoscopy, PNA.  Declines flu.  Check labs.  Anticipatory guidance provided.

## 2023-09-25 NOTE — Assessment & Plan Note (Signed)
Chronic problem.  Currently well controlled and asymptomatic.  No med changes at this time

## 2023-09-25 NOTE — Progress Notes (Signed)
   Subjective:    Patient ID: [name], male    DOB: 1946/02/24, 77 y.o.   MRN: [id number]  HPI CPE- UTD on colonoscopy, PNA.  Patient Care Team    Relationship Specialty Notifications Start End  Sheliah Hatch, MD PCP - General Family Medicine  01/19/17   Shirlean Kelly, MD Consulting Physician Neurosurgery  01/19/17   Laqueta Linden, MD (Inactive) Attending Physician Cardiology  01/19/17   Malissa Hippo, MD (Inactive) Consulting Physician Gastroenterology  01/19/17   Dahlia Byes, [facility] (Inactive) Pharmacist Pharmacist  04/09/20    Comment: PHONE NUMBER [phone]    Health Maintenance  Topic Date Due   INFLUENZA VACCINE  03/18/2024 (Originally 07/19/2023)   Medicare Annual Wellness (AWV)  05/15/2024   Colonoscopy  05/01/2028   Pneumonia Vaccine 28+ Years old  Completed   Hepatitis C Screening  Completed   HPV VACCINES  Aged Out   DTaP/Tdap/Td  Discontinued   COVID-19 Vaccine  Discontinued   Zoster Vaccines- Shingrix  Discontinued      Review of Systems Patient reports no vision/hearing changes, anorexia, fever ,adenopathy, persistant/recurrent hoarseness, swallowing issues, chest pain, palpitations, edema, persistant/recurrent cough, hemoptysis, dyspnea (rest,exertional, paroxysmal nocturnal), gastrointestinal  bleeding (melena, rectal bleeding), abdominal pain, excessive heart burn, GU symptoms (dysuria, hematuria, voiding/incontinence issues) syncope, focal weakness, memory loss, numbness & tingling, skin/hair/nail changes, depression, anxiety, abnormal bruising/bleeding, musculoskeletal symptoms/signs.     Objective:   Physical Exam General Appearance:    Alert, cooperative, no distress, appears stated age  Head:    Normocephalic, without obvious abnormality, atraumatic  Eyes:    PERRL, conjunctiva/corneas clear, EOM's intact both eyes       Ears:    Normal TM's and external ear canals, both ears  Nose:   Nares normal, septum midline, mucosa normal, no  drainage   or sinus tenderness  Throat:   Lips, mucosa, and tongue normal; teeth and gums normal  Neck:   Supple, symmetrical, trachea midline, no adenopathy;       thyroid:  No enlargement/tenderness/nodules  Back:     Symmetric, no curvature, ROM normal, no CVA tenderness  Lungs:     Clear to auscultation bilaterally, respirations unlabored  Chest wall:    No tenderness or deformity  Heart:    Regular rate and rhythm, S1 and S2 normal, no murmur, rub   or gallop  Abdomen:     Soft, non-tender, bowel sounds active all four quadrants,    no masses, no organomegaly  Genitalia:    deferred  Rectal:    Extremities:   Extremities normal, atraumatic, no cyanosis or edema  Pulses:   2+ and symmetric all extremities  Skin:   Skin color, texture, turgor normal, no rashes.  Multiple areas of sun damage  Lymph nodes:   Cervical, supraclavicular, and axillary nodes normal  Neurologic:   CNII-XII intact. Normal strength, sensation and reflexes      throughout         Assessment & Plan:

## 2023-09-26 ENCOUNTER — Other Ambulatory Visit: Payer: Self-pay | Admitting: Family Medicine

## 2023-09-26 ENCOUNTER — Telehealth: Payer: Self-pay

## 2023-09-26 DIAGNOSIS — I10 Essential (primary) hypertension: Secondary | ICD-10-CM

## 2023-09-26 NOTE — Telephone Encounter (Signed)
-----   Message from Neena Rhymes sent at 09/26/2023  7:47 AM EDT ----- Labs look great!  No changes at this time

## 2023-12-05 ENCOUNTER — Other Ambulatory Visit: Payer: Self-pay | Admitting: Family Medicine

## 2023-12-05 DIAGNOSIS — M1712 Unilateral primary osteoarthritis, left knee: Secondary | ICD-10-CM

## 2023-12-17 ENCOUNTER — Ambulatory Visit: Payer: Self-pay | Admitting: Family Medicine

## 2023-12-17 NOTE — Telephone Encounter (Signed)
  Chief Complaint: Left Side Pain Symptoms: left side pain around rib cage Frequency: since Christmas Day Pertinent Negatives: Patient denies fever, abdominal pain, pain with urination, blood in urine Disposition: [] ED /[] Urgent Care (no appt availability in office) / [x] Appointment(In office/virtual)/ []  [facility]/ [] Home Care/ [] Refused Recommended Disposition /[] [facility]/ []  Follow-up with PCP Additional Notes: patient c/o left side pain that started Christmas Day. Patient states that the pain is worse trying to sleep. Patient endorses pain increased when he moves onto his side from his back. Initially discussed need for ED but after further assessment-appointment made for 12/18/2023 at 11:20 am based on protocol. Patient verbalizes understanding of plan and will follow through. All questions answered.   Copied from CRM [id number]. Topic: Clinical - Red Word Triage >> Dec 17, 2023  9:34 AM Melissa C wrote: Red Word that prompted transfer to Nurse Triage: pain in left side, causing patient not to sleep Reason for Disposition  MODERATE pain (e.g., interferes with normal activities or awakens from sleep)  Answer Assessment - Initial Assessment Questions 1. LOCATION: "Where does it hurt?" (e.g., left, right)     Left 2. ONSET: "When did the pain start?"     Started Christmas Day 3. SEVERITY: "How bad is the pain?" (e.g., Scale 1-10; mild, moderate, or severe)   - MILD (1-3): doesn't interfere with normal activities    - MODERATE (4-7): interferes with normal activities or awakens from sleep    - SEVERE (8-10): excruciating pain and patient unable to do normal activities (stays in bed)       3 but if he rolls over on his right side feels like a 10 4. PATTERN: "Does the pain come and go, or is it constant?"      constant 5. CAUSE: "What do you think is causing the pain?"     Unsure of what's causing it 6. OTHER SYMPTOMS:  "Do you have any other symptoms?" (e.g.,  fever, abdomen pain, vomiting, leg weakness, burning with urination, blood in urine)     No  Protocols used: Flank Pain-A-AH

## 2023-12-17 NOTE — Telephone Encounter (Signed)
FYI

## 2023-12-18 ENCOUNTER — Ambulatory Visit (INDEPENDENT_AMBULATORY_CARE_PROVIDER_SITE_OTHER): Payer: Medicare Other | Admitting: Family Medicine

## 2023-12-18 ENCOUNTER — Encounter: Payer: Self-pay | Admitting: Family Medicine

## 2023-12-18 VITALS — BP 132/78 | HR 78 | Temp 98.7°F | Ht 68.5 in | Wt 213.0 lb

## 2023-12-18 DIAGNOSIS — R109 Unspecified abdominal pain: Secondary | ICD-10-CM | POA: Diagnosis not present

## 2023-12-18 LAB — POCT URINALYSIS DIPSTICK
Bilirubin, UA: NEGATIVE
Blood, UA: NEGATIVE
Glucose, UA: NEGATIVE
Ketones, UA: NEGATIVE
Nitrite, UA: NEGATIVE
Protein, UA: NEGATIVE
Spec Grav, UA: 1.01 (ref 1.010–1.025)
Urobilinogen, UA: 0.2 U/dL
pH, UA: 6 (ref 5.0–8.0)

## 2023-12-18 MED ORDER — TIZANIDINE HCL 4 MG PO TABS: 4.0000 mg | ORAL_TABLET | Freq: Three times a day (TID) | ORAL | 0 refills | Status: DC | PRN

## 2023-12-18 NOTE — Patient Instructions (Signed)
 Follow up as needed or as scheduled Thankfully there is no blood in your urine- which makes a kidney stone much less likely! This is likely a pulled muscle Take Tylenol  or ibuprofen as needed for pain USE the Tizanidine  as needed for muscle spasm- particularly before bed USE a heating pad to help w/ pain relief Call with any questions or concerns Hang in there! Happy New Year!!

## 2023-12-18 NOTE — Progress Notes (Signed)
   Subjective:    Patient ID: [name], male    DOB: September 10, 1946, 77 y.o.   MRN: [id number]  HPI L flank pain- sxs started Christmas day.  'it was terrible'.  Pt was not able to find a comfortable position.  Yesterday started feeling better.  Pt wonders if he pulled a muscle after heaving lifting.  No blood in urine, no pain with urination.  No nausea associated w/ pain.  Pt reports no radiation of pain.  Pain described as a burning or a 'dull nagging'.   Review of Systems For ROS see HPI     Objective:   Physical Exam Vitals reviewed.  Constitutional:      General: He is not in acute distress.    Appearance: Normal appearance. He is not ill-appearing.  Pulmonary:     Effort: Pulmonary effort is normal. No respiratory distress.     Breath sounds: Normal breath sounds.  Abdominal:     Palpations: Abdomen is soft.     Tenderness: There is no abdominal tenderness. There is no right CVA tenderness, left CVA tenderness or guarding.  Musculoskeletal:        General: No tenderness.     Comments: Pain w/ rotation of trunk to L  Skin:    General: Skin is warm and dry.     Findings: No bruising or rash.  Neurological:     General: No focal deficit present.     Mental Status: He is alert and oriented to person, place, and time.  Psychiatric:        Mood and Affect: Mood normal.        Behavior: Behavior normal.        Thought Content: Thought content normal.           Assessment & Plan:  L flank pain- new.  Given severity of pain initially, concern for kidney stone.  UA w/o blood making stone much less likely.  No TTP over kidneys or along flank.  + pain w/ truncal rotation which is more consistent w/ muscle strain.  Continue tylenol /ibuprofen prn.  Add Tizanidine - particularly at night.  Heating pad for pain relief.  Pt expressed understanding and is in agreement w/ plan.

## 2023-12-20 ENCOUNTER — Telehealth: Payer: Self-pay

## 2023-12-20 LAB — URINE CULTURE
MICRO NUMBER:: 15907165
Result:: NO GROWTH
SPECIMEN QUALITY:: ADEQUATE

## 2023-12-20 NOTE — Telephone Encounter (Signed)
-----   Message from Neena Rhymes sent at 12/20/2023  7:43 AM EST ----- No evidence of UTI.  I hope your flank pain is better!

## 2023-12-20 NOTE — Telephone Encounter (Signed)
 Pts wife has been notified Pts wife states medication has helps and he was able to sleep better last night Pt will call if this does not improve

## 2023-12-24 ENCOUNTER — Other Ambulatory Visit: Payer: Self-pay | Admitting: Family Medicine

## 2023-12-24 DIAGNOSIS — I10 Essential (primary) hypertension: Secondary | ICD-10-CM

## 2024-01-08 ENCOUNTER — Telehealth: Payer: Self-pay | Admitting: Family Medicine

## 2024-01-08 MED ORDER — ALLOPURINOL 300 MG PO TABS: 300.0000 mg | ORAL_TABLET | Freq: Every day | ORAL | 1 refills | Status: DC

## 2024-01-08 NOTE — Telephone Encounter (Signed)
Please see note below.  Copied from CRM [id number]. Topic: Clinical - Medication Refill >> Jan 08, 2024 11:01 AM Corin V wrote: Most Recent Primary Care Visit:  Provider: Sheliah Hatch  Department: [facility]  Visit Type: ACUTE  Date: 12/18/2023  Medication: allopurinol (ZYLOPRIM) 100 MG tablet- patient taking 3 100mg  tablets, wants to switch to 1 300mg  tablet  Has the patient contacted their pharmacy? Yes (Agent: If no, request that the patient contact the pharmacy for the refill. If patient does not wish to contact the pharmacy document the reason why and proceed with request.) (Agent: If yes, when and what did the pharmacy advise?)  Is this the correct pharmacy for this prescription? Yes If no, delete pharmacy and type the correct one.  This is the patient's preferred pharmacy:  [facility] [place], [place] - [address] [place] [address] Phone: [phone] Fax: [phone]   Has the prescription been filled recently? Yes  Is the patient out of the medication? No  Has the patient been seen for an appointment in the last year OR does the patient have an upcoming appointment? Yes  Can we respond through MyChart? No  Agent: Please be advised that Rx refills may take up to 3 business days. We ask that you follow-up with your pharmacy.

## 2024-01-08 NOTE — Telephone Encounter (Signed)
Please advise on request

## 2024-01-08 NOTE — Telephone Encounter (Signed)
Prescription for 300mg  Allopurinol sent to pharmacy

## 2024-01-16 DIAGNOSIS — L570 Actinic keratosis: Secondary | ICD-10-CM | POA: Diagnosis not present

## 2024-01-16 DIAGNOSIS — L820 Inflamed seborrheic keratosis: Secondary | ICD-10-CM | POA: Diagnosis not present

## 2024-01-16 DIAGNOSIS — C44519 Basal cell carcinoma of skin of other part of trunk: Secondary | ICD-10-CM | POA: Diagnosis not present

## 2024-01-16 DIAGNOSIS — X32XXXD Exposure to sunlight, subsequent encounter: Secondary | ICD-10-CM | POA: Diagnosis not present

## 2024-01-23 ENCOUNTER — Ambulatory Visit: Payer: Medicare Other | Admitting: Family Medicine

## 2024-01-23 ENCOUNTER — Encounter: Payer: Self-pay | Admitting: Family Medicine

## 2024-01-23 VITALS — BP 142/62 | HR 71 | Temp 98.9°F | Ht 68.5 in | Wt 212.4 lb

## 2024-01-23 DIAGNOSIS — R051 Acute cough: Secondary | ICD-10-CM

## 2024-01-23 LAB — POC COVID19 BINAXNOW: SARS Coronavirus 2 Ag: NEGATIVE — AB

## 2024-01-23 LAB — POC INFLUENZA A&B (BINAX/QUICKVUE)
Influenza A, POC: NEGATIVE
Influenza B, POC: NEGATIVE

## 2024-01-23 NOTE — Progress Notes (Signed)
   Subjective:    Patient ID: [name], male    DOB: 1946-03-23, 78 y.o.   MRN: [id number]  HPI Cough- sxs started Monday w/ congestion.  Mild sore throat, slight cough.  No fever.  Denies body aches.  Some sinus pressure in forehead and behind the eyes.     Review of Systems For ROS see HPI     Objective:   Physical Exam Vitals reviewed.  Constitutional:      General: He is not in acute distress.    Appearance: Normal appearance. He is not ill-appearing.  HENT:     Head: Normocephalic and atraumatic.     Right Ear: Tympanic membrane and ear canal normal.     Left Ear: Tympanic membrane and ear canal normal.     Nose: Congestion present.     Comments: No TTP over frontal or maxillary sinuses    Mouth/Throat:     Mouth: Mucous membranes are moist.     Pharynx: No oropharyngeal exudate or posterior oropharyngeal erythema.  Eyes:     Extraocular Movements: Extraocular movements intact.     Conjunctiva/sclera: Conjunctivae normal.  Cardiovascular:     Rate and Rhythm: Normal rate and regular rhythm.  Pulmonary:     Effort: Pulmonary effort is normal. No respiratory distress.     Breath sounds: No wheezing or rhonchi.  Musculoskeletal:     Cervical back: Normal range of motion.  Lymphadenopathy:     Cervical: No cervical adenopathy.  Skin:    General: Skin is warm and dry.  Neurological:     General: No focal deficit present.     Mental Status: He is alert and oriented to person, place, and time.  Psychiatric:        Mood and Affect: Mood normal.        Behavior: Behavior normal.        Thought Content: Thought content normal.           Assessment & Plan:  Cough- new.  Likely a viral illness as PE is unremarkable.  No need for abx.  Reviewed supportive care and red flags that should prompt return.  Pt expressed understanding and is in agreement w/ plan.

## 2024-01-23 NOTE — Patient Instructions (Signed)
 Follow up as needed or as scheduled This appears to be a virus and should improve w/ time Drink LOTS of fluids OTC cold/sinus medication can be helpful REST! Call with any questions or concerns Hang in there!!!

## 2024-01-25 ENCOUNTER — Ambulatory Visit: Payer: Self-pay | Admitting: Family Medicine

## 2024-01-25 ENCOUNTER — Telehealth: Payer: Self-pay

## 2024-01-25 NOTE — Telephone Encounter (Signed)
 Copied from CRM [id number]. Topic: Clinical - Medication Question >> Jan 25, 2024  8:42 AM Pinkey ORN wrote: Reason for CRM: Medication Call In Request >> Jan 25, 2024  8:43 AM Pinkey ORN wrote: Patient is calling in, requesting to have some sinus medication called into the pharmacy. Patient is requesting a call back with update on rather or not this can be completed. Call back number is [phone]

## 2024-01-25 NOTE — Telephone Encounter (Signed)
 There was not evidence of a bacterial infection on his exam.  An antibiotic is not needed.  OTC Dayquil or AlkaSeltzer cold/sinus would be of the most benefit at this time.

## 2024-01-25 NOTE — Telephone Encounter (Signed)
  Info only call: RN notified pt of Dr Charis note:  There was not evidence of a bacterial infection on his exam.  An antibiotic is not needed.  OTC Dayquil or AlkaSeltzer cold/sinus would be of the most benefit at this time.  Pt verbalized understanding and says he will get some. RN advised pt to call back if symptoms worsen.        Copied from CRM [id number]. Topic: Clinical - Medication Question >> Jan 25, 2024  1:22 PM Joanell B wrote: Reason for CRM: Patient is calling in again requesting to have some sinus medication called into the pharmacy. Patient is requesting a call back with update on rather or not this can be completed. Call back number is [phone] Reason for Disposition  General information question, no triage required and triager able to answer question  Protocols used: Information Only Call - No Triage-A-AH

## 2024-01-28 NOTE — Telephone Encounter (Signed)
 Please see original phone note.

## 2024-02-04 ENCOUNTER — Encounter: Payer: Self-pay | Admitting: Family Medicine

## 2024-02-04 ENCOUNTER — Ambulatory Visit (INDEPENDENT_AMBULATORY_CARE_PROVIDER_SITE_OTHER): Payer: Medicare Other | Admitting: Family Medicine

## 2024-02-04 ENCOUNTER — Ambulatory Visit (INDEPENDENT_AMBULATORY_CARE_PROVIDER_SITE_OTHER): Payer: Medicare Other | Admitting: Gastroenterology

## 2024-02-04 VITALS — BP 160/100 | HR 98 | Temp 99.2°F

## 2024-02-04 DIAGNOSIS — R1900 Intra-abdominal and pelvic swelling, mass and lump, unspecified site: Secondary | ICD-10-CM | POA: Diagnosis not present

## 2024-02-04 NOTE — Progress Notes (Signed)
   Subjective:    Patient ID: [name], male    DOB: 21-Jan-1946, 78 y.o.   MRN: [id number]  HPI L sided knot- pt reports he noticed it 2-3 nights ago while showering.  Denies pain, not TTP.   Review of Systems For ROS see HPI     Objective:   Physical Exam Vitals reviewed.  Constitutional:      General: He is not in acute distress.    Appearance: Normal appearance. He is not ill-appearing.  HENT:     Head: Normocephalic and atraumatic.  Pulmonary:     Effort: Pulmonary effort is normal. No respiratory distress.  Abdominal:     Palpations: Abdomen is soft.     Tenderness: There is no abdominal tenderness. There is no guarding or rebound.     Hernia: A hernia (large L ventral hernia that reduces when lying down, prominent upon standing) is present.  Skin:    General: Skin is warm and dry.     Findings: No bruising or erythema.  Neurological:     General: No focal deficit present.     Mental Status: He is alert and oriented to person, place, and time.  Psychiatric:        Mood and Affect: Mood normal.        Behavior: Behavior normal.        Thought Content: Thought content normal.           Assessment & Plan:  L flank mass- new.  Suspect ventral hernia.  No pain w/ palpation.  Reduces when lying down, prominent when standing.  Will get imaging to assess and then determine next steps (surgical referral most likely).  Reviewed supportive care and red flags that should prompt return.  Pt expressed understanding and is in agreement w/ plan.

## 2024-02-04 NOTE — Patient Instructions (Signed)
 Follow up as needed or as scheduled We will call to schedule your CT scan at [facility] I suspect this is a hernia- which is why it bulges when you stand up and disappears when you lie down Once we know for sure, we can refer you to the surgeon to talk about next steps (if any) Call with any questions or concerns Stay Safe!  Stay Healthy!

## 2024-02-12 ENCOUNTER — Ambulatory Visit (HOSPITAL_COMMUNITY)
Admission: RE | Admit: 2024-02-12 | Discharge: 2024-02-12 | Disposition: A | Discharge status: Home / Self Care | Payer: Medicare Other | Source: Ambulatory Visit | Attending: Family Medicine | Admitting: Family Medicine

## 2024-02-12 DIAGNOSIS — R161 Splenomegaly, not elsewhere classified: Secondary | ICD-10-CM | POA: Diagnosis not present

## 2024-02-12 DIAGNOSIS — K4020 Bilateral inguinal hernia, without obstruction or gangrene, not specified as recurrent: Secondary | ICD-10-CM | POA: Diagnosis not present

## 2024-02-12 DIAGNOSIS — R1900 Intra-abdominal and pelvic swelling, mass and lump, unspecified site: Secondary | ICD-10-CM | POA: Diagnosis not present

## 2024-02-12 MED ORDER — IOHEXOL 300 MG/ML  SOLN
100.0000 mL | Freq: Once | INTRAMUSCULAR | Status: AC | PRN
  Administered 2024-02-12: 100 mL via INTRAVENOUS

## 2024-02-14 ENCOUNTER — Telehealth: Payer: Self-pay

## 2024-02-14 NOTE — Telephone Encounter (Signed)
 He had a CT scan (not a TC scan) of abd/pelvis to assess for possible hernia.  There is not extreme urgency to have this read.  Please explain to pt that we're dealing w/ a radiology back log and will get him his results as soon as they are available

## 2024-02-14 NOTE — Telephone Encounter (Signed)
 Results not yet in the chart, attempted call to radiology reading but the phone would ring then disconnect tried 2 times  please advise level of urgency for read once we can reach them? Should it be done urgently?

## 2024-02-14 NOTE — Telephone Encounter (Signed)
 Copied from CRM [id number]. Topic: Clinical - Lab/Test Results >> Feb 14, 2024  2:41 PM Kathryne Eriksson wrote: Reason for CRM: TC Scan >> Feb 14, 2024  2:43 PM Kathryne Eriksson wrote: Patient states he had a TC scan completed Tuesday and he haven't heard anything back as of yet. Patient wants to know if they're suppose to call him or Sheliah Hatch, MD in regards to the results.

## 2024-02-18 ENCOUNTER — Ambulatory Visit (INDEPENDENT_AMBULATORY_CARE_PROVIDER_SITE_OTHER): Payer: Medicare Other | Admitting: Gastroenterology

## 2024-02-18 ENCOUNTER — Encounter: Payer: Self-pay | Admitting: Family Medicine

## 2024-02-22 ENCOUNTER — Other Ambulatory Visit: Payer: Self-pay

## 2024-02-22 DIAGNOSIS — R161 Splenomegaly, not elsewhere classified: Secondary | ICD-10-CM

## 2024-02-27 ENCOUNTER — Encounter: Payer: Self-pay | Admitting: Oncology

## 2024-02-27 ENCOUNTER — Inpatient Hospital Stay: Attending: Oncology | Admitting: Oncology

## 2024-02-27 ENCOUNTER — Inpatient Hospital Stay

## 2024-02-27 VITALS — BP 184/80 | HR 60 | Temp 97.7°F | Resp 16 | Ht 72.0 in | Wt 209.4 lb

## 2024-02-27 DIAGNOSIS — R760 Raised antibody titer: Secondary | ICD-10-CM | POA: Diagnosis not present

## 2024-02-27 DIAGNOSIS — K4020 Bilateral inguinal hernia, without obstruction or gangrene, not specified as recurrent: Secondary | ICD-10-CM | POA: Insufficient documentation

## 2024-02-27 DIAGNOSIS — L570 Actinic keratosis: Secondary | ICD-10-CM | POA: Diagnosis not present

## 2024-02-27 DIAGNOSIS — C44519 Basal cell carcinoma of skin of other part of trunk: Secondary | ICD-10-CM | POA: Diagnosis not present

## 2024-02-27 DIAGNOSIS — Z7982 Long term (current) use of aspirin: Secondary | ICD-10-CM | POA: Diagnosis not present

## 2024-02-27 DIAGNOSIS — I10 Essential (primary) hypertension: Secondary | ICD-10-CM | POA: Diagnosis not present

## 2024-02-27 DIAGNOSIS — X32XXXD Exposure to sunlight, subsequent encounter: Secondary | ICD-10-CM | POA: Diagnosis not present

## 2024-02-27 DIAGNOSIS — Z87891 Personal history of nicotine dependence: Secondary | ICD-10-CM | POA: Diagnosis not present

## 2024-02-27 DIAGNOSIS — L820 Inflamed seborrheic keratosis: Secondary | ICD-10-CM | POA: Diagnosis not present

## 2024-02-27 DIAGNOSIS — Z79899 Other long term (current) drug therapy: Secondary | ICD-10-CM | POA: Insufficient documentation

## 2024-02-27 DIAGNOSIS — E785 Hyperlipidemia, unspecified: Secondary | ICD-10-CM | POA: Insufficient documentation

## 2024-02-27 DIAGNOSIS — R161 Splenomegaly, not elsewhere classified: Secondary | ICD-10-CM | POA: Diagnosis not present

## 2024-02-27 LAB — CBC WITH DIFFERENTIAL/PLATELET
Abs Immature Granulocytes: 0.01 10*3/uL (ref 0.00–0.07)
Basophils Absolute: 0.1 10*3/uL (ref 0.0–0.1)
Basophils Relative: 1 %
Eosinophils Absolute: 0.2 10*3/uL (ref 0.0–0.5)
Eosinophils Relative: 2 %
HCT: 46.5 % (ref 39.0–52.0)
Hemoglobin: 14.7 g/dL (ref 13.0–17.0)
Immature Granulocytes: 0 %
Lymphocytes Relative: 24 %
Lymphs Abs: 1.7 10*3/uL (ref 0.7–4.0)
MCH: 27.3 pg (ref 26.0–34.0)
MCHC: 31.6 g/dL (ref 30.0–36.0)
MCV: 86.4 fL (ref 80.0–100.0)
Monocytes Absolute: 0.6 10*3/uL (ref 0.1–1.0)
Monocytes Relative: 8 %
Neutro Abs: 4.8 10*3/uL (ref 1.7–7.7)
Neutrophils Relative %: 65 %
Platelets: 189 10*3/uL (ref 150–400)
RBC: 5.38 MIL/uL (ref 4.22–5.81)
RDW: 14.5 % (ref 11.5–15.5)
WBC: 7.3 10*3/uL (ref 4.0–10.5)
nRBC: 0 % (ref 0.0–0.2)

## 2024-02-27 LAB — COMPREHENSIVE METABOLIC PANEL
ALT: 20 U/L (ref 0–44)
AST: 18 U/L (ref 15–41)
Albumin: 4.2 g/dL (ref 3.5–5.0)
Alkaline Phosphatase: 98 U/L (ref 38–126)
Anion gap: 9 (ref 5–15)
BUN: 15 mg/dL (ref 8–23)
CO2: 28 mmol/L (ref 22–32)
Calcium: 9.3 mg/dL (ref 8.9–10.3)
Chloride: 104 mmol/L (ref 98–111)
Creatinine, Ser: 1.02 mg/dL (ref 0.61–1.24)
GFR, Estimated: >60 mL/min (ref >60)
Glucose, Bld: 99 mg/dL (ref 70–99)
Potassium: 4.4 mmol/L (ref 3.5–5.1)
Sodium: 141 mmol/L (ref 135–145)
Total Bilirubin: 0.5 mg/dL (ref 0.0–1.2)
Total Protein: 7 g/dL (ref 6.5–8.1)

## 2024-02-27 LAB — LACTATE DEHYDROGENASE: LDH: 141 U/L (ref 98–192)

## 2024-02-27 LAB — URIC ACID: Uric Acid, Serum: 4 mg/dL (ref 3.7–8.6)

## 2024-02-27 NOTE — Patient Instructions (Signed)
 VISIT SUMMARY:  You were referred to [place] due to an enlarged spleen, which caused significant discomfort and pain, especially at night. Fortunately, your symptoms have improved, and you are now able to sleep in different positions without pain. You also mentioned having a long-standing hernia that has not caused any issues.  YOUR PLAN:  -SPLENOMEGALY: Splenomegaly means an enlarged spleen, which in your case is likely due to a recent sinus infection. Other possible causes include lymphoma or blood-related cancers. We will conduct blood tests, including a complete blood count (CBC), a lymphoma panel, and an Epstein-Barr virus (EBV) test. Please avoid contact sports to prevent any risk of spleen rupture. We will discuss the results in a follow-up appointment in two weeks.   INSTRUCTIONS:  Please schedule a follow-up appointment in two weeks to discuss the results of your blood tests. Avoid contact sports to prevent any risk of spleen rupture.

## 2024-02-28 LAB — SURGICAL PATHOLOGY

## 2024-02-28 LAB — EPSTEIN BARR VRS(EBV DNA BY PCR): EBV DNA QN by PCR: NEGATIVE [IU]/mL

## 2024-03-03 DIAGNOSIS — R161 Splenomegaly, not elsewhere classified: Secondary | ICD-10-CM | POA: Insufficient documentation

## 2024-03-03 LAB — EBV AB TO VIRAL CAPSID AG PNL, IGG+IGM
EBV VCA IgG: >600 U/mL — ABNORMAL HIGH (ref 0.0–17.9)
EBV VCA IgM: <36 U/mL (ref 0.0–35.9)

## 2024-03-03 LAB — EPSTEIN-BARR VIRUS NUCLEAR ANTIGEN ANTIBODY, IGG: EBV NA IgG: 170 U/mL — ABNORMAL HIGH (ref 0.0–17.9)

## 2024-03-03 NOTE — Progress Notes (Signed)
 [facility] at [facility] HEMATOLOGY NEW VISIT  Sheliah Hatch, MD  REASON FOR REFERRAL: Splenomegaly   HISTORY OF PRESENT ILLNESS: Chistian Kasler Bean 78 y.o. male referred for splenomegaly.  He is accompanied by his wife today.Patient stated that he had an episode of severe sinus infection, sore throat and cough with no fever at the beginning of February and since then had improved.  A few weeks after that he noticed a knot on his left side of the abdomen.  He reached out to his primary care who did a CT scan for probable hernia and discovered splenomegaly.  Patient denies any other symptoms at this time.  He reports a little bit of discomfort and pain in the area of of the knot which has improved significantly since its discovery a few weeks ago.  He reports no weight loss, loss of appetite, frequent infections, fever, chills.  He has no other complaints today.  Patient quit smoking 32 years ago, drinks alcohol occasionally.  Lives with his wife in [place].   I have reviewed the past medical history, past surgical history, social history and family history with the patient   ALLERGIES:  is allergic to bee venom.  MEDICATIONS:  Current Outpatient Medications  Medication Sig Dispense Refill   acetaminophen (TYLENOL) 650 MG CR tablet Take 1,300 mg by mouth every 8 (eight) hours as needed for pain.     allopurinol (ZYLOPRIM) 300 MG tablet Take 1 tablet (300 mg total) by mouth daily. 90 tablet 1   aspirin EC 81 MG tablet Take 81 mg by mouth daily. Swallow whole.     EPINEPHrine 0.3 mg/0.3 mL IJ SOAJ injection Inject 0.3 mg into the muscle as needed for anaphylaxis.  0   lisinopril (ZESTRIL) 10 MG tablet Take 1 tablet (10 mg total) by mouth daily. 90 tablet 3   metoprolol succinate (TOPROL-XL) 25 MG 24 hr tablet TAKE ONE TABLET ONCE DAILY 90 tablet 0   simvastatin (ZOCOR) 40 MG tablet TAKE ONE TABLET DAILY 90 tablet 1   No current facility-administered  medications for this visit.     REVIEW OF SYSTEMS:   Constitutional: Denies fevers, chills or night sweats Eyes: Denies blurriness of vision Ears, nose, mouth, throat, and face: Denies mucositis or sore throat Respiratory: Denies cough, dyspnea or wheezes Cardiovascular: Denies palpitation, chest discomfort or lower extremity swelling Gastrointestinal:  Denies nausea, heartburn or change in bowel habits Skin: Denies abnormal skin rashes Lymphatics: Denies new lymphadenopathy or easy bruising Neurological:Denies numbness, tingling or new weaknesses Behavioral/Psych: Mood is stable, no new changes  All other systems were reviewed with the patient and are negative.  PHYSICAL EXAMINATION:   Vitals:   02/27/24 1419  BP: (!) 184/80  Pulse: 60  Resp: 16  Temp: 97.7 F (36.5 C)  SpO2: 97%    GENERAL:alert, no distress and comfortable LYMPH:  no palpable lymphadenopathy in the cervical, axillary or inguinal LUNGS: clear to auscultation and percussion with normal breathing effort HEART: regular rate & rhythm and no murmurs and no lower extremity edema ABDOMEN:abdomen soft, non-tender and normal bowel sounds, diastases recti present with some herniation, could not palpate the spleen-restricted by abdominal obesity. Musculoskeletal:no cyanosis of digits and no clubbing  NEURO: alert & oriented x 3 with fluent speech  LABORATORY DATA:  I have reviewed the data as listed  Lab Results  Component Value Date   WBC 7.3 02/27/2024   NEUTROABS 4.8 02/27/2024   HGB 14.7 02/27/2024  HCT 46.5 02/27/2024   MCV 86.4 02/27/2024   PLT 189 02/27/2024      Component Value Date/Time   NA 141 02/27/2024 1458   K 4.4 02/27/2024 1458   CL 104 02/27/2024 1458   CO2 28 02/27/2024 1458   GLUCOSE 99 02/27/2024 1458   BUN 15 02/27/2024 1458   CREATININE 1.02 02/27/2024 1458   CALCIUM 9.3 02/27/2024 1458   PROT 7.0 02/27/2024 1458   ALBUMIN 4.2 02/27/2024 1458   AST 18 02/27/2024 1458   ALT  20 02/27/2024 1458   ALKPHOS 98 02/27/2024 1458   BILITOT 0.5 02/27/2024 1458   GFRNONAA >60 02/27/2024 1458   GFRAA >60 10/26/2017 1032       Chemistry      Component Value Date/Time   NA 141 02/27/2024 1458   K 4.4 02/27/2024 1458   CL 104 02/27/2024 1458   CO2 28 02/27/2024 1458   BUN 15 02/27/2024 1458   CREATININE 1.02 02/27/2024 1458      Component Value Date/Time   CALCIUM 9.3 02/27/2024 1458   ALKPHOS 98 02/27/2024 1458   AST 18 02/27/2024 1458   ALT 20 02/27/2024 1458   BILITOT 0.5 02/27/2024 1458       RADIOGRAPHIC STUDIES: I have personally reviewed the radiological images as listed and agreed with the findings in the report.  CT ABDOMEN PELVIS W CONTRAST CLINICAL DATA:  Left flank mass, hernia suspected.  EXAM: CT ABDOMEN AND PELVIS WITH CONTRAST  TECHNIQUE: Multidetector CT imaging of the abdomen and pelvis was performed using the standard protocol following bolus administration of intravenous contrast.  RADIATION DOSE REDUCTION: This exam was performed according to the departmental dose-optimization program which includes automated exposure control, adjustment of the mA and/or kV according to patient size and/or use of iterative reconstruction technique.  CONTRAST:  OMNIPAQUE IOHEXOL 300 MG/ML  SOLN  COMPARISON:  None Available.  FINDINGS: Lower chest: Hypoventilatory change in the dependent lungs.  Hepatobiliary: No suspicious hepatic lesion. Gallbladder is unremarkable. Biliary ductal dilation.  Pancreas: No pancreatic ductal dilation or evidence of acute inflammation.  Spleen: Splenomegaly measuring 17 cm in maximum axial dimension.  Adrenals/Urinary Tract: No suspicious adrenal nodule/mass. No hydronephrosis. Kidneys demonstrate symmetric enhancement. Urinary bladder is unremarkable for degree of distension.  Stomach/Bowel: Stomach is unremarkable for degree of distension. No pathologic dilation of small or large bowel. No  evidence of acute bowel inflammation.  Vascular/Lymphatic: Normal caliber abdominal aorta. Aortic atherosclerosis. No pathologically enlarged abdominal or pelvic lymph nodes.  Reproductive: Prostate is unremarkable.  Other: Fat containing bilateral inguinal hernias. No left flank hernia identified.  Musculoskeletal: S shaped curvature of the thoracolumbar spine with associated degenerative change. No acute osseous abnormality.  IMPRESSION: 1. Fat containing bilateral inguinal hernias. No left flank hernia identified. 2. Splenomegaly of indeterminate clinical significance. 3.  Aortic Atherosclerosis (ICD10-I70.0).  Electronically Signed   By: Maudry Mayhew M.D.   On: 02/16/2024 10:35    ASSESSMENT & PLAN:  Patient is a 78 year old male with past medical history of chronic gout, hypertension and hyperlipidemia presenting for splenomegaly.    Splenomegaly Differential at this time includes recent viral or hematological malignancies.  Patient has no B symptoms at this time putting lymphoma lower on the differential list.  Symptoms have since improved.  Has no symptoms from splenomegaly. -Will order CBC, lymphoma panel, and EBV viral test. - Advise against contact sports to prevent spleen rupture. - Schedule follow-up in two weeks to discuss results.   Orders Placed  This Encounter  Procedures   CBC with Differential/Platelet    Standing Status:   Future    Number of Occurrences:   1    Expected Date:   02/27/2024    Expiration Date:   02/26/2025   Comprehensive metabolic panel    Standing Status:   Future    Number of Occurrences:   1    Expected Date:   02/27/2024    Expiration Date:   02/26/2025   Uric acid    Standing Status:   Future    Number of Occurrences:   1    Expected Date:   02/27/2024    Expiration Date:   02/26/2025   Lactate dehydrogenase    Standing Status:   Future    Number of Occurrences:   1    Expected Date:   02/27/2024    Expiration Date:    02/26/2025   Flow Cytometry, Peripheral Blood (Oncology)    Standing Status:   Future    Number of Occurrences:   1    Expected Date:   02/27/2024    Expiration Date:   02/26/2025   EBV ab to viral capsid ag pnl, IgG+IgM    Standing Status:   Future    Number of Occurrences:   1    Expected Date:   02/27/2024    Expiration Date:   02/26/2025   Gwyneth Sprout barr vrs(ebv dna by pcr)    Standing Status:   Future    Number of Occurrences:   1    Expected Date:   02/27/2024    Expiration Date:   02/26/2025   Epstein-Barr Virus Nuclear Antigen Antibody, IGG    Standing Status:   Future    Number of Occurrences:   1    Expected Date:   02/27/2024    Expiration Date:   02/26/2025    The total time spent in the appointment was 40 minutes encounter with patients including review of chart and various tests results, discussions about plan of care and coordination of care plan   All questions were answered. The patient knows to call the clinic with any problems, questions or concerns. No barriers to learning was detected.   Cindie Crumbly, MD 3/17/202511:46 AM

## 2024-03-03 NOTE — Assessment & Plan Note (Signed)
 Differential at this time includes recent viral or hematological malignancies.  Patient has no B symptoms at this time putting lymphoma lower on the differential list.  Symptoms have since improved.  Has no symptoms from splenomegaly. -Will order CBC, lymphoma panel, and EBV viral test. - Advise against contact sports to prevent spleen rupture. - Schedule follow-up in two weeks to discuss results.

## 2024-03-04 LAB — FLOW CYTOMETRY

## 2024-03-12 ENCOUNTER — Inpatient Hospital Stay: Admitting: Oncology

## 2024-03-12 VITALS — BP 167/84 | HR 62 | Temp 97.4°F | Resp 18 | Ht 72.0 in | Wt 213.0 lb

## 2024-03-12 DIAGNOSIS — Z79899 Other long term (current) drug therapy: Secondary | ICD-10-CM | POA: Diagnosis not present

## 2024-03-12 DIAGNOSIS — I10 Essential (primary) hypertension: Secondary | ICD-10-CM | POA: Diagnosis not present

## 2024-03-12 DIAGNOSIS — R760 Raised antibody titer: Secondary | ICD-10-CM | POA: Diagnosis not present

## 2024-03-12 DIAGNOSIS — E785 Hyperlipidemia, unspecified: Secondary | ICD-10-CM | POA: Diagnosis not present

## 2024-03-12 DIAGNOSIS — K4020 Bilateral inguinal hernia, without obstruction or gangrene, not specified as recurrent: Secondary | ICD-10-CM | POA: Diagnosis not present

## 2024-03-12 DIAGNOSIS — R161 Splenomegaly, not elsewhere classified: Secondary | ICD-10-CM | POA: Diagnosis not present

## 2024-03-12 DIAGNOSIS — Z87891 Personal history of nicotine dependence: Secondary | ICD-10-CM | POA: Diagnosis not present

## 2024-03-12 DIAGNOSIS — Z7982 Long term (current) use of aspirin: Secondary | ICD-10-CM | POA: Diagnosis not present

## 2024-03-12 NOTE — Assessment & Plan Note (Signed)
 Splenomegaly likely secondary to past EBV infection. Asymptomatic, no active infection.  No B symptoms.  Flow cytometry was negative. - Avoid contact sports to prevent rupture. - Refer to infectious disease specialist for further evaluation of EBV titers and interpretation of test results and to evaluate for further infectious causes for splenomegaly. - Repeat CT scan in six months to monitor spleen size.

## 2024-03-12 NOTE — Progress Notes (Signed)
 [facility] at [facility]  HEMATOLOGY FOLLOW-UP VISIT  Steven Hatch, MD  REASON FOR FOLLOW-UP: Splenomegaly  ASSESSMENT & PLAN:  Patient is a 78 year old male following for splenomegaly.    Splenomegaly Splenomegaly likely secondary to past EBV infection. Asymptomatic, no active infection.  No B symptoms.  Flow cytometry was negative. - Avoid contact sports to prevent rupture. - Refer to infectious disease specialist for further evaluation of EBV titers and interpretation of test results and to evaluate for further infectious causes for splenomegaly. - Repeat CT scan in six months to monitor spleen size.  Elevated EBV antibody titer Patient has elevated EBV IgG titers.  EBV DNA PCR was negative. -Will refer to ID to evaluate the status further and to assess for association for splenomegaly.  Return to clinic in 6 months with CT scan  Orders Placed This Encounter  Procedures   CT CHEST ABDOMEN PELVIS W CONTRAST    Standing Status:   Future    Expected Date:   09/12/2024    Expiration Date:   03/12/2025    If indicated for the ordered procedure, I authorize the administration of contrast media per Radiology protocol:   Yes    Does the patient have a contrast media/X-ray dye allergy?:   No    Preferred imaging location?:   [facility]    If indicated for the ordered procedure, I authorize the administration of oral contrast media per Radiology protocol:   Yes   CBC with Differential/Platelet    Standing Status:   Future    Expected Date:   09/11/2024    Expiration Date:   03/12/2025   Comprehensive metabolic panel    Standing Status:   Future    Expected Date:   09/11/2024    Expiration Date:   03/12/2025    The total time spent in the appointment was 20 minutes encounter with patients including review of chart and various tests results, discussions about plan of care and coordination of care plan   All questions were answered. The patient  knows to call the clinic with any problems, questions or concerns. No barriers to learning was detected.  Cindie Crumbly, MD 3/26/20256:08 PM   INTERVAL HISTORY: [name] 78 y.o. male following for splenomegaly.  He is accompanied by his wife today.  Patient continues to report some pain in the left flank area which is intermittent.The pain is not constant and seems to be triggered by certain movements or activities, such as fishing. The patient describes the pain as internal, not sensitive to touch, and sometimes feels like a "stab." The pain does not limit the patient's activities and is not associated with any other symptoms such as fever or chills. The patient's appetite is good and he has even gained weight recently.  Overall, patient is feeling well and has no other complaints today.  I have reviewed the past medical history, past surgical history, social history and family history with the patient   ALLERGIES:  is allergic to bee venom.  MEDICATIONS:  Current Outpatient Medications  Medication Sig Dispense Refill   acetaminophen (TYLENOL) 650 MG CR tablet Take 1,300 mg by mouth every 8 (eight) hours as needed for pain.     allopurinol (ZYLOPRIM) 300 MG tablet Take 1 tablet (300 mg total) by mouth daily. 90 tablet 1   aspirin EC 81 MG tablet Take 81 mg by mouth daily. Swallow whole.     EPINEPHrine 0.3 mg/0.3 mL IJ  SOAJ injection Inject 0.3 mg into the muscle as needed for anaphylaxis.  0   lisinopril (ZESTRIL) 10 MG tablet Take 1 tablet (10 mg total) by mouth daily. 90 tablet 3   metoprolol succinate (TOPROL-XL) 25 MG 24 hr tablet TAKE ONE TABLET ONCE DAILY 90 tablet 0   simvastatin (ZOCOR) 40 MG tablet TAKE ONE TABLET DAILY 90 tablet 1   No current facility-administered medications for this visit.     REVIEW OF SYSTEMS:   Constitutional: Denies fevers, chills or night sweats Eyes: Denies blurriness of vision Ears, nose, mouth, throat, and face: Denies mucositis or sore  throat Respiratory: Denies cough, dyspnea or wheezes Cardiovascular: Denies palpitation, chest discomfort or lower extremity swelling Gastrointestinal:  Denies nausea, heartburn or change in bowel habits Skin: Denies abnormal skin rashes Lymphatics: Denies new lymphadenopathy or easy bruising Neurological:Denies numbness, tingling or new weaknesses Behavioral/Psych: Mood is stable, no new changes  All other systems were reviewed with the patient and are negative.  PHYSICAL EXAMINATION:   Vitals:   03/12/24 1316 03/12/24 1321  BP: (!) 188/87 (!) 167/84  Pulse: 62   Resp: 18   Temp: (!) 97.4 F (36.3 C)   SpO2: 100%    GENERAL:alert, no distress and comfortable LYMPH:  no palpable lymphadenopathy in the cervical, axillary or inguinal LUNGS: clear to auscultation and percussion with normal breathing effort HEART: regular rate & rhythm and no murmurs and no lower extremity edema ABDOMEN:abdomen soft, non-tender and normal bowel sounds, diastases recti present with some herniation, could not palpate the spleen-restricted by abdominal obesity. Musculoskeletal:no cyanosis of digits and no clubbing  NEURO: alert & oriented x 3 with fluent speech  LABORATORY DATA:  I have reviewed the data as listed  Lab Results  Component Value Date   WBC 7.3 02/27/2024   NEUTROABS 4.8 02/27/2024   HGB 14.7 02/27/2024   HCT 46.5 02/27/2024   MCV 86.4 02/27/2024   PLT 189 02/27/2024       Chemistry      Component Value Date/Time   NA 141 02/27/2024 1458   K 4.4 02/27/2024 1458   CL 104 02/27/2024 1458   CO2 28 02/27/2024 1458   BUN 15 02/27/2024 1458   CREATININE 1.02 02/27/2024 1458      Component Value Date/Time   CALCIUM 9.3 02/27/2024 1458   ALKPHOS 98 02/27/2024 1458   AST 18 02/27/2024 1458   ALT 20 02/27/2024 1458   BILITOT 0.5 02/27/2024 1458      Latest Reference Range & Units 02/27/24 14:58  EBV VCA IgG 0.0 - 17.9 U/mL >600.0 (H)  EBV VCA IgM 0.0 - 35.9 U/mL <36.0   EBV NA IgG 0.0 - 17.9 U/mL 170.0 (H)  EBV DNA QN by PCR Negative IU/mL Negative  (H): Data is abnormally high   Flow cytometry: 02/27/2024: DIAGNOSIS:   - No abnormal B or T-cell population identified    GATING AND PHENOTYPIC ANALYSIS:   Gated population: Flow cytometric immunophenotyping is performed using  antibodies to the antigens listed in the table below. Electronic gates  are placed around a cell cluster displaying light scatter properties  corresponding to: lymphocytes   Abnormal Cells in gated population: N/A   Phenotype of Abnormal Cells: N/A

## 2024-03-12 NOTE — Assessment & Plan Note (Signed)
 Patient has elevated EBV IgG titers.  EBV DNA PCR was negative. -Will refer to ID to evaluate the status further and to assess for association for splenomegaly.

## 2024-03-12 NOTE — Patient Instructions (Signed)
 VISIT SUMMARY:  During today's visit, we discussed your intermittent abdominal pain and its possible causes, including your history of an enlarged spleen and past Epstein-Barr virus (EBV) infection. We reviewed your current symptoms and made a plan for further evaluation and monitoring.  YOUR PLAN:  -SPLENOMEGALY: Splenomegaly means an enlarged spleen, which in your case is likely due to a past Epstein-Barr virus (EBV) infection. There is no active infection, but you should avoid contact sports to prevent spleen rupture. We will refer you to an infectious disease specialist for further evaluation and repeat a CT scan in six months to monitor the size of your spleen.  -EPSTEIN-BARR VIRUS (EBV) INFECTION: You have a past infection with the Epstein-Barr virus (EBV), confirmed by elevated test results. There is no active infection at this time. We will refer you to an infectious disease specialist for further evaluation and potential additional testing.  -ABDOMINAL PAIN: You are experiencing intermittent stabbing abdominal pain that does not limit your activities. This pain may be related to muscle strain or a hernia. We will monitor your symptoms and ask you to report any changes or worsening of the pain.  -HERNIA: You have a known hernia that is not currently causing significant symptoms. We will continue to monitor the hernia for any changes or development of symptoms.  INSTRUCTIONS:  Please follow up with the infectious disease specialist as referred for further evaluation of your past EBV infection and interpretation of test results. Additionally, schedule a repeat CT scan in six months to monitor the size of your spleen. Report any changes or worsening of your abdominal pain or hernia symptoms to our office.

## 2024-03-13 ENCOUNTER — Other Ambulatory Visit: Payer: Self-pay | Admitting: Family Medicine

## 2024-03-25 ENCOUNTER — Encounter: Payer: Self-pay | Admitting: Family Medicine

## 2024-03-25 ENCOUNTER — Ambulatory Visit (INDEPENDENT_AMBULATORY_CARE_PROVIDER_SITE_OTHER): Payer: Medicare Other | Admitting: Family Medicine

## 2024-03-25 VITALS — BP 130/68 | HR 98 | Temp 98.7°F | Ht 69.5 in | Wt 211.1 lb

## 2024-03-25 DIAGNOSIS — E785 Hyperlipidemia, unspecified: Secondary | ICD-10-CM | POA: Diagnosis not present

## 2024-03-25 DIAGNOSIS — I10 Essential (primary) hypertension: Secondary | ICD-10-CM

## 2024-03-25 DIAGNOSIS — M1712 Unilateral primary osteoarthritis, left knee: Secondary | ICD-10-CM | POA: Diagnosis not present

## 2024-03-25 LAB — BASIC METABOLIC PANEL WITH GFR
BUN: 15 mg/dL (ref 6–23)
CO2: 28 meq/L (ref 19–32)
Calcium: 9.1 mg/dL (ref 8.4–10.5)
Chloride: 104 meq/L (ref 96–112)
Creatinine, Ser: 1.04 mg/dL (ref 0.40–1.50)
GFR: 70.33 mL/min (ref >60.00)
Glucose, Bld: 81 mg/dL (ref 70–99)
Potassium: 3.9 meq/L (ref 3.5–5.1)
Sodium: 142 meq/L (ref 135–145)

## 2024-03-25 LAB — HEPATIC FUNCTION PANEL
ALT: 13 U/L (ref 0–53)
AST: 13 U/L (ref 0–37)
Albumin: 4.3 g/dL (ref 3.5–5.2)
Alkaline Phosphatase: 94 U/L (ref 39–117)
Bilirubin, Direct: 0.2 mg/dL (ref 0.0–0.3)
Total Bilirubin: 0.8 mg/dL (ref 0.2–1.2)
Total Protein: 6.6 g/dL (ref 6.0–8.3)

## 2024-03-25 LAB — CBC WITH DIFFERENTIAL/PLATELET
Basophils Absolute: 0 10*3/uL (ref 0.0–0.1)
Basophils Relative: 0.6 % (ref 0.0–3.0)
Eosinophils Absolute: 0.1 10*3/uL (ref 0.0–0.7)
Eosinophils Relative: 1.5 % (ref 0.0–5.0)
HCT: 43.4 % (ref 39.0–52.0)
Hemoglobin: 14.2 g/dL (ref 13.0–17.0)
Lymphocytes Relative: 20.6 % (ref 12.0–46.0)
Lymphs Abs: 1.5 10*3/uL (ref 0.7–4.0)
MCHC: 32.7 g/dL (ref 30.0–36.0)
MCV: 84.8 fl (ref 78.0–100.0)
Monocytes Absolute: 0.7 10*3/uL (ref 0.1–1.0)
Monocytes Relative: 9.5 % (ref 3.0–12.0)
Neutro Abs: 5 10*3/uL (ref 1.4–7.7)
Neutrophils Relative %: 67.8 % (ref 43.0–77.0)
Platelets: 180 10*3/uL (ref 150.0–400.0)
RBC: 5.12 Mil/uL (ref 4.22–5.81)
RDW: 14.5 % (ref 11.5–15.5)
WBC: 7.4 10*3/uL (ref 4.0–10.5)

## 2024-03-25 LAB — LIPID PANEL
Cholesterol: 108 mg/dL (ref 0–200)
HDL: 36.6 mg/dL — ABNORMAL LOW (ref >39.00)
LDL Cholesterol: 44 mg/dL (ref 0–99)
NonHDL: 71.05
Total CHOL/HDL Ratio: 3
Triglycerides: 135 mg/dL (ref 0.0–149.0)
VLDL: 27 mg/dL (ref 0.0–40.0)

## 2024-03-25 LAB — TSH: TSH: 1.42 u[IU]/mL (ref 0.35–5.50)

## 2024-03-25 MED ORDER — SIMVASTATIN 40 MG PO TABS: 40.0000 mg | ORAL_TABLET | Freq: Every day | ORAL | 1 refills | Status: DC

## 2024-03-25 MED ORDER — METOPROLOL SUCCINATE ER 25 MG PO TB24: 25.0000 mg | ORAL_TABLET | Freq: Every day | ORAL | 0 refills | Status: DC

## 2024-03-25 NOTE — Patient Instructions (Signed)
 Schedule your complete physical in 6 months We'll notify you of your lab results and make any changes if needed Continue to walk regularly and stay active AVOID heavy lifting! Call with any questions or concerns Stay Safe!  Stay Healthy! Happy Spring!!!

## 2024-03-25 NOTE — Assessment & Plan Note (Signed)
Chronic problem.  On Simvastatin '40mg'$  daily w/o difficulty.  Check labs.  Adjust meds prn

## 2024-03-25 NOTE — Assessment & Plan Note (Signed)
 Pt's BP is well controlled today.  Currently on Lisinopril 10mg  daily and Metoprolol XL 25mg  daily.  Asymptomatic.  Check labs due to ACE use but no anticipated med changes.

## 2024-03-25 NOTE — Progress Notes (Signed)
   Subjective:    Patient ID: [name], male    DOB: 08/08/1946, 78 y.o.   MRN: [id number]  HPI HTN- chronic problem, on Lisinopril 10mg  daily and Metoprolol XL 25mg  daily w/ good control.  No CP, SOB, HA's, visual changes, edema.  Hyperlipidemia- chronic problem, on Simvastatin 40mg  daily.  No abd pain, N/V.   Review of Systems For ROS see HPI     Objective:   Physical Exam Vitals reviewed.  Constitutional:      General: He is not in acute distress.    Appearance: Normal appearance. He is well-developed. He is not ill-appearing.  HENT:     Head: Normocephalic and atraumatic.  Eyes:     Extraocular Movements: Extraocular movements intact.     Conjunctiva/sclera: Conjunctivae normal.     Pupils: Pupils are equal, round, and reactive to light.  Neck:     Thyroid: No thyromegaly.  Cardiovascular:     Rate and Rhythm: Normal rate and regular rhythm.     Pulses: Normal pulses.     Heart sounds: Normal heart sounds. No murmur heard. Pulmonary:     Effort: Pulmonary effort is normal. No respiratory distress.     Breath sounds: Normal breath sounds.  Abdominal:     General: Bowel sounds are normal. There is no distension.     Palpations: Abdomen is soft. There is mass (splenomegaly).  Musculoskeletal:     Cervical back: Normal range of motion and neck supple.     Right lower leg: No edema.     Left lower leg: No edema.  Lymphadenopathy:     Cervical: No cervical adenopathy.  Skin:    General: Skin is warm and dry.  Neurological:     General: No focal deficit present.     Mental Status: He is alert and oriented to person, place, and time.     Cranial Nerves: No cranial nerve deficit.  Psychiatric:        Mood and Affect: Mood normal.        Behavior: Behavior normal.           Assessment & Plan:

## 2024-03-26 ENCOUNTER — Encounter: Payer: Self-pay | Admitting: Family Medicine

## 2024-03-26 ENCOUNTER — Telehealth: Payer: Self-pay

## 2024-03-26 NOTE — Telephone Encounter (Signed)
 Pt has reviewed via MyChart

## 2024-03-26 NOTE — Telephone Encounter (Signed)
-----   Message from Neena Rhymes sent at 03/26/2024  7:42 AM EDT ----- Labs look great!  No changes at this time

## 2024-03-27 ENCOUNTER — Ambulatory Visit: Admitting: Internal Medicine

## 2024-04-03 ENCOUNTER — Ambulatory Visit: Admitting: Internal Medicine

## 2024-04-03 ENCOUNTER — Encounter: Payer: Self-pay | Admitting: Internal Medicine

## 2024-04-03 ENCOUNTER — Other Ambulatory Visit: Payer: Self-pay

## 2024-04-03 VITALS — BP 180/89 | HR 63 | Temp 98.0°F | Ht 69.5 in | Wt 210.0 lb

## 2024-04-03 DIAGNOSIS — R161 Splenomegaly, not elsewhere classified: Secondary | ICD-10-CM | POA: Diagnosis not present

## 2024-04-03 NOTE — Progress Notes (Signed)
 [facility]  Reason for Consult:positive ebv serology/splenomegaly Referring Provider: Cindie Crumbly    Patient Active Problem List   Diagnosis Date Noted   Elevated EBV antibody titer 03/12/2024   Splenomegaly 03/03/2024   Change in stool caliber 04/09/2023   Fecal urgency 04/09/2023   Bloating 04/09/2023   Physical exam 07/11/2019   Primary osteoarthritis of left knee 10/22/2017   Lumbar stenosis with neurogenic claudication 05/03/2017   Gout 01/19/2017   Chest pain 09/01/2014   Acquired scoliosis 07/01/2014   Degeneration of lumbar intervertebral disc 08/08/2013   Acute low back pain 08/08/2013   Coronary artery disease excluded 05/20/2012   Hyperlipidemia 04/29/2011   Essential hypertension 11/13/2007      HPI: [name] is a 78 y.o. male referred by hematology for positive ebv serology  I reviewed chart; last note by dr Anders Simmonds 03/12/24 Assymptomatic splenomegaly Flow cytometry negative  02/27/24 initial oncology visit for splenomegaly Beginning 01/2024 severe sinus infection, sorethroat, cough and resolved. Subsequently a few weeks later noticed a Knot left abdominal area Pcp ordered a ct scan (reviewed) showed splenomegaly The knot sensation is improving Referred to oncology  Hx smoking quit 32 years ago (smoked as a teens up to quitting point) Occasional etoh  No travel outside of [place] Does a lot of fishing No cats Does have pet dog   Eating well No fever, chill, nightsweat, weight loss No cough, chest pain, dyspnea   No personal hx cancer. Utd with age appropriate cancer screening (prostate/colon)  He is feeling 100% now  In February no obvious known sick contact - he felt sick for about a months before he got better     Review of Systems: ROS All other ros negative       Past Medical History:  Diagnosis Date   Arthritis    Cellulitis and abscess of other specified site    Colon polyps     Gout    Hypertension    Neuromuscular disorder (HCC)    arthritis   Scoliosis     Social History   Tobacco Use   Smoking status: Former    Current packs/day: 0.00    Average packs/day: 3.0 packs/day for 39.0 years (117.0 ttl pk-yrs)    Types: Cigarettes    Start date: 12/18/1957    Quit date: 12/18/1996    Years since quitting: 27.3    Passive exposure: Current   Smokeless tobacco: Never   Tobacco comments:    started smoking as a child  Vaping Use   Vaping status: Never Used  Substance Use Topics   Alcohol use: Yes    Alcohol/week: 0.0 standard drinks of alcohol    Comment: beer occasionally   Drug use: No    Family History  Problem Relation Age of Onset   Kidney disease Sister    Cancer Other        Family Hx of Cancer, CAD,Diabetes,Kidney Failure   Colon cancer Neg Hx     Allergies  Allergen Reactions   Bee Venom Anaphylaxis    OBJECTIVE: Vitals:   04/03/24 1351  BP: (!) 180/89  Pulse: 63  Temp: 98 F (36.7 C)  TempSrc: Temporal  SpO2: 96%  Weight: 210 lb (95.3 kg)  Height: 5' 9.5" (1.765 m)   Body mass index is 30.57 kg/m.   Physical Exam General/constitutional: no distress, pleasant HEENT: Normocephalic, PER, Conj Clear, EOMI, Oropharynx clear Neck supple CV: rrr no mrg Lungs:  clear to auscultation, normal respiratory effort Abd: Soft, Nontender Ext: no edema Skin: No Rash Neuro: nonfocal MSK: no peripheral joint swelling/tenderness/warmth; back spines nontender   Lab: Lab Results  Component Value Date   WBC 7.4 03/25/2024   HGB 14.2 03/25/2024   HCT 43.4 03/25/2024   MCV 84.8 03/25/2024   PLT 180.0 03/25/2024   Last metabolic panel Lab Results  Component Value Date   GLUCOSE 81 03/25/2024   NA 142 03/25/2024   K 3.9 03/25/2024   CL 104 03/25/2024   CO2 28 03/25/2024   BUN 15 03/25/2024   CREATININE 1.04 03/25/2024   GFR 70.33 03/25/2024   CALCIUM 9.1 03/25/2024   PROT 6.6 03/25/2024   ALBUMIN 4.3 03/25/2024   BILITOT 0.8  03/25/2024   ALKPHOS 94 03/25/2024   AST 13 03/25/2024   ALT 13 03/25/2024   ANIONGAP 9 02/27/2024     Microbiology:  Serology: 02/27/24 ebv serology  Ebv vca igg >600; igm <36 (negative) Ebv NA IgG 170 positive EBV dna pcr negative  Imaging: Reviewed   02/12/24 ct abd pelv with contrast CLINICAL DATA:  Left flank mass, hernia suspected.   EXAM: CT ABDOMEN AND PELVIS WITH CONTRAST   TECHNIQUE: Multidetector CT imaging of the abdomen and pelvis was performed using the standard protocol following bolus administration of intravenous contrast.   RADIATION DOSE REDUCTION: This exam was performed according to the departmental dose-optimization program which includes automated exposure control, adjustment of the mA and/or kV according to patient size and/or use of iterative reconstruction technique.   CONTRAST:  100mL OMNIPAQUE IOHEXOL 300 MG/ML  SOLN   COMPARISON:  None Available.   FINDINGS: Lower chest: Hypoventilatory change in the dependent lungs.   Hepatobiliary: No suspicious hepatic lesion. Gallbladder is unremarkable. Biliary ductal dilation.   Pancreas: No pancreatic ductal dilation or evidence of acute inflammation.   Spleen: Splenomegaly measuring 17 cm in maximum axial dimension.   Adrenals/Urinary Tract: No suspicious adrenal nodule/mass. No hydronephrosis. Kidneys demonstrate symmetric enhancement. Urinary bladder is unremarkable for degree of distension.   Stomach/Bowel: Stomach is unremarkable for degree of distension. No pathologic dilation of small or large bowel. No evidence of acute bowel inflammation.   Vascular/Lymphatic: Normal caliber abdominal aorta. Aortic atherosclerosis. No pathologically enlarged abdominal or pelvic lymph nodes.   Reproductive: Prostate is unremarkable.   Other: Fat containing bilateral inguinal hernias. No left flank hernia identified.   Musculoskeletal: S shaped curvature of the thoracolumbar spine  with associated degenerative change. No acute osseous abnormality.   IMPRESSION: 1. Fat containing bilateral inguinal hernias. No left flank hernia identified. 2. Splenomegaly of indeterminate clinical significance. 3.  Aortic Atherosclerosis  Assessment/plan: Problem List Items Addressed This Visit     Splenomegaly - Primary      Mild splenomegaly unclear etiology at this time  Ebv serology indicates past infection and not related to 01/2024 events and has nothing to do with the spleen  Reviewed ct of the spleen and correlate with his history. I have no concern for any bacterial/parasitic infection of the spleen  Noninfectious cause of spleen discussed, including cirrhotic and noncirrhotic preportal hypertensive splenomegaly  He has repeat ct scan 08/2024 and I would say this would be the most I would recommend at this time for follow up  If 08/2024 ct still shows enlarged spleen I would recommend nonID w/u for causes       Follow-up: No follow-ups on file.  Jamesetta Mcbride, MD [facility] 04/03/2024, 2:12 PM

## 2024-04-03 NOTE — Patient Instructions (Signed)
 I do not see any infection as cause of mildly enlarged spleen at this time  You have repeat ct scan 08/2024 and I would say this would be the most I would recommend at this time for follow up  If 08/2024 ct still shows enlarged spleen I would recommend nonID w/u for causes    If your oncologist/primary care is still concerned about any infection there please let me know

## 2024-04-09 DIAGNOSIS — Z08 Encounter for follow-up examination after completed treatment for malignant neoplasm: Secondary | ICD-10-CM | POA: Diagnosis not present

## 2024-04-09 DIAGNOSIS — L570 Actinic keratosis: Secondary | ICD-10-CM | POA: Diagnosis not present

## 2024-04-09 DIAGNOSIS — Z85828 Personal history of other malignant neoplasm of skin: Secondary | ICD-10-CM | POA: Diagnosis not present

## 2024-04-09 DIAGNOSIS — X32XXXD Exposure to sunlight, subsequent encounter: Secondary | ICD-10-CM | POA: Diagnosis not present

## 2024-06-24 ENCOUNTER — Other Ambulatory Visit: Payer: Self-pay | Admitting: Family Medicine

## 2024-06-24 DIAGNOSIS — I10 Essential (primary) hypertension: Secondary | ICD-10-CM

## 2024-07-16 ENCOUNTER — Other Ambulatory Visit: Payer: Self-pay

## 2024-07-16 ENCOUNTER — Emergency Department (HOSPITAL_COMMUNITY)

## 2024-07-16 ENCOUNTER — Emergency Department (HOSPITAL_COMMUNITY)
Admission: EM | Admit: 2024-07-16 | Discharge: 2024-07-16 | Disposition: A | Discharge status: Home / Self Care | Source: Ambulatory Visit | Attending: Emergency Medicine | Admitting: Emergency Medicine

## 2024-07-16 ENCOUNTER — Encounter (HOSPITAL_COMMUNITY): Payer: Self-pay

## 2024-07-16 DIAGNOSIS — I2510 Atherosclerotic heart disease of native coronary artery without angina pectoris: Secondary | ICD-10-CM | POA: Diagnosis not present

## 2024-07-16 DIAGNOSIS — M545 Low back pain, unspecified: Secondary | ICD-10-CM | POA: Insufficient documentation

## 2024-07-16 DIAGNOSIS — K5730 Diverticulosis of large intestine without perforation or abscess without bleeding: Secondary | ICD-10-CM | POA: Diagnosis not present

## 2024-07-16 DIAGNOSIS — Z7982 Long term (current) use of aspirin: Secondary | ICD-10-CM | POA: Insufficient documentation

## 2024-07-16 DIAGNOSIS — R0602 Shortness of breath: Secondary | ICD-10-CM | POA: Insufficient documentation

## 2024-07-16 DIAGNOSIS — N281 Cyst of kidney, acquired: Secondary | ICD-10-CM | POA: Diagnosis not present

## 2024-07-16 DIAGNOSIS — K429 Umbilical hernia without obstruction or gangrene: Secondary | ICD-10-CM | POA: Diagnosis not present

## 2024-07-16 DIAGNOSIS — R918 Other nonspecific abnormal finding of lung field: Secondary | ICD-10-CM | POA: Diagnosis not present

## 2024-07-16 DIAGNOSIS — R161 Splenomegaly, not elsewhere classified: Secondary | ICD-10-CM | POA: Diagnosis not present

## 2024-07-16 DIAGNOSIS — R109 Unspecified abdominal pain: Secondary | ICD-10-CM | POA: Diagnosis not present

## 2024-07-16 DIAGNOSIS — I700 Atherosclerosis of aorta: Secondary | ICD-10-CM | POA: Diagnosis not present

## 2024-07-16 HISTORY — DX: Splenomegaly, not elsewhere classified: R16.1

## 2024-07-16 LAB — COMPREHENSIVE METABOLIC PANEL WITH GFR
ALT: 15 U/L (ref 0–44)
AST: 18 U/L (ref 15–41)
Albumin: 4.2 g/dL (ref 3.5–5.0)
Alkaline Phosphatase: 96 U/L (ref 38–126)
Anion gap: 11 (ref 5–15)
BUN: 15 mg/dL (ref 8–23)
CO2: 26 mmol/L (ref 22–32)
Calcium: 9 mg/dL (ref 8.9–10.3)
Chloride: 103 mmol/L (ref 98–111)
Creatinine, Ser: 0.91 mg/dL (ref 0.61–1.24)
GFR, Estimated: >60 mL/min (ref >60)
Glucose, Bld: 96 mg/dL (ref 70–99)
Potassium: 3.4 mmol/L — ABNORMAL LOW (ref 3.5–5.1)
Sodium: 140 mmol/L (ref 135–145)
Total Bilirubin: 1 mg/dL (ref 0.0–1.2)
Total Protein: 7.1 g/dL (ref 6.5–8.1)

## 2024-07-16 LAB — CBC WITH DIFFERENTIAL/PLATELET
Abs Immature Granulocytes: 0.03 K/uL (ref 0.00–0.07)
Basophils Absolute: 0 K/uL (ref 0.0–0.1)
Basophils Relative: 1 %
Eosinophils Absolute: 0.1 K/uL (ref 0.0–0.5)
Eosinophils Relative: 1 %
HCT: 47.2 % (ref 39.0–52.0)
Hemoglobin: 15.4 g/dL (ref 13.0–17.0)
Immature Granulocytes: 0 %
Lymphocytes Relative: 20 %
Lymphs Abs: 1.6 K/uL (ref 0.7–4.0)
MCH: 28.3 pg (ref 26.0–34.0)
MCHC: 32.6 g/dL (ref 30.0–36.0)
MCV: 86.8 fL (ref 80.0–100.0)
Monocytes Absolute: 0.6 K/uL (ref 0.1–1.0)
Monocytes Relative: 8 %
Neutro Abs: 5.5 K/uL (ref 1.7–7.7)
Neutrophils Relative %: 70 %
Platelets: 173 K/uL (ref 150–400)
RBC: 5.44 MIL/uL (ref 4.22–5.81)
RDW: 14.6 % (ref 11.5–15.5)
WBC: 7.9 K/uL (ref 4.0–10.5)
nRBC: 0 % (ref 0.0–0.2)

## 2024-07-16 LAB — URINALYSIS, ROUTINE W REFLEX MICROSCOPIC
Bilirubin Urine: NEGATIVE
Glucose, UA: NEGATIVE mg/dL
Hgb urine dipstick: NEGATIVE
Ketones, ur: NEGATIVE mg/dL
Leukocytes,Ua: NEGATIVE
Nitrite: NEGATIVE
Protein, ur: NEGATIVE mg/dL
Specific Gravity, Urine: 1.004 — ABNORMAL LOW (ref 1.005–1.030)
pH: 6 (ref 5.0–8.0)

## 2024-07-16 LAB — LIPASE, BLOOD: Lipase: 29 U/L (ref 11–51)

## 2024-07-16 MED ORDER — IOHEXOL 300 MG/ML  SOLN
100.0000 mL | Freq: Once | INTRAMUSCULAR | Status: AC | PRN
  Administered 2024-07-16: 100 mL via INTRAVENOUS

## 2024-07-16 MED ORDER — METHOCARBAMOL 500 MG PO TABS: 500.0000 mg | ORAL_TABLET | Freq: Two times a day (BID) | ORAL | 0 refills | Status: AC

## 2024-07-16 MED ORDER — IOHEXOL 350 MG/ML SOLN
60.0000 mL | Freq: Once | INTRAVENOUS | Status: AC | PRN
  Administered 2024-07-16: 60 mL via INTRAVENOUS

## 2024-07-16 NOTE — Discharge Instructions (Signed)
 Please follow-up closely with your primary care doctor on an outpatient basis.  Return to emergency department immediately for any new or worsening symptoms.

## 2024-07-16 NOTE — ED Triage Notes (Signed)
 Pt arrived via POV& c/o left flank pain and reports being advised by his doctor to come to the ER for evaluation with concern of splenomegaly.

## 2024-07-16 NOTE — ED Provider Notes (Signed)
 [name] Provider Note   CSN: [id number] Arrival date & time: 07/16/24  1531     Patient presents with: Flank Pain   [name] is a 78 y.o. male.   Patient is a 79 year old male who presents emergency department the chief complaint of bilateral flank and back pain which has been ongoing for approximate the past 2 days.  He denies any recent falls or blunt trauma.  There has been no radiation of the pain into his legs.  He denies any urinary bowel incontinence, saddle paresthesias, gait changes, fever, chills.  He denies any associated nausea, vomiting, diarrhea.  He has had no dysuria or hematuria.  He has had no recent falls or blunt trauma to his abdomen.  He does have a history of splenomegaly.  He did reach out to his primary care doctor today who recommended he be evaluated in the emergency department given his splenomegaly.  He notes that he has had no chest pain or shortness of breath.  He does note that the pain is worse with deep inspiration.   Flank Pain       Prior to Admission medications   Medication Sig Start Date End Date Taking? Authorizing Provider  methocarbamol  (ROBAXIN ) 500 MG tablet Take 1 tablet (500 mg total) by mouth 2 (two) times daily. 07/16/24  Yes Daralene Bruckner D, PA-C  acetaminophen  (TYLENOL ) 650 MG CR tablet Take 1,300 mg by mouth every 8 (eight) hours as needed for pain.    [provider]  allopurinol  (ZYLOPRIM ) 300 MG tablet TAKE ONE TABLET BY MOUTH DAILY 06/24/24   Tabori, Katherine E, MD  aspirin  EC 81 MG tablet Take 81 mg by mouth daily. Swallow whole.    [provider]  EPINEPHrine  0.3 mg/0.3 mL IJ SOAJ injection Inject 0.3 mg into the muscle as needed for anaphylaxis. 03/25/18   [provider]  lisinopril  (ZESTRIL ) 10 MG tablet TAKE ONE TABLET BY MOUTH DAILY 03/13/24   Tabori, Katherine E, MD  metoprolol  succinate (TOPROL -XL) 25 MG 24 hr tablet TAKE ONE TABLET BY  MOUTH DAILY 06/24/24   Tabori, Katherine E, MD  simvastatin  (ZOCOR ) 40 MG tablet Take 1 tablet (40 mg total) by mouth daily. 03/25/24   Mahlon Comer BRAVO, MD    Allergies: Bee venom    Review of Systems  Genitourinary:  Positive for flank pain.  All other systems reviewed and are negative.   Updated Vital Signs BP (!) 179/86 (BP Location: Left Arm)   Pulse 64   Temp 97.8 F (36.6 C) (Oral)   Resp 16   Ht 5' 9.5 (1.765 m)   Wt 95.3 kg   SpO2 96%   BMI 30.58 kg/m   Physical Exam Vitals and nursing note reviewed.  Constitutional:      Appearance: Normal appearance.  HENT:     Head: Normocephalic and atraumatic.     Nose: Nose normal.     Mouth/Throat:     Mouth: Mucous membranes are moist.  Eyes:     Extraocular Movements: Extraocular movements intact.     Conjunctiva/sclera: Conjunctivae normal.     Pupils: Pupils are equal, round, and reactive to light.  Cardiovascular:     Rate and Rhythm: Normal rate and regular rhythm.     Pulses: Normal pulses.     Heart sounds: Normal heart sounds. No murmur heard.    No gallop.  Pulmonary:     Effort: Pulmonary effort is normal. No  respiratory distress.     Breath sounds: Normal breath sounds. No stridor. No wheezing, rhonchi or rales.  Abdominal:     General: Abdomen is flat. Bowel sounds are normal. There is no distension.     Palpations: Abdomen is soft.     Tenderness: There is no abdominal tenderness. There is no right CVA tenderness.  Musculoskeletal:        General: Normal range of motion.     Cervical back: Normal range of motion and neck supple. No rigidity or tenderness.     Right lower leg: No edema.     Left lower leg: No edema.  Skin:    General: Skin is warm and dry.     Findings: No bruising or rash.  Neurological:     General: No focal deficit present.     Mental Status: He is alert and oriented to person, place, and time. Mental status is at baseline.     Cranial Nerves: No cranial nerve deficit.      Sensory: No sensory deficit.     Motor: No weakness.     Coordination: Coordination normal.     Gait: Gait normal.  Psychiatric:        Mood and Affect: Mood normal.        Behavior: Behavior normal.        Thought Content: Thought content normal.        Judgment: Judgment normal.     (all labs ordered are listed, but only abnormal results are displayed) Labs Reviewed  COMPREHENSIVE METABOLIC PANEL WITH GFR - Abnormal; Notable for the following components:      Result Value   Potassium 3.4 (*)    All other components within normal limits  URINALYSIS, ROUTINE W REFLEX MICROSCOPIC - Abnormal; Notable for the following components:   Color, Urine STRAW (*)    Specific Gravity, Urine 1.004 (*)    All other components within normal limits  CBC WITH DIFFERENTIAL/PLATELET  LIPASE, BLOOD    EKG: None  Radiology: CT Angio Chest PE W and/or Wo Contrast Result Date: 07/16/2024 CLINICAL DATA:  Pulmonary embolus suspected with high probability. Shortness of breath. Unable to take a deep breath since this morning. EXAM: CT ANGIOGRAPHY CHEST WITH CONTRAST TECHNIQUE: Multidetector CT imaging of the chest was performed using the standard protocol during bolus administration of intravenous contrast. Multiplanar CT image reconstructions and MIPs were obtained to evaluate the vascular anatomy. RADIATION DOSE REDUCTION: This exam was performed according to the departmental dose-optimization program which includes automated exposure control, adjustment of the mA and/or kV according to patient size and/or use of iterative reconstruction technique. CONTRAST:  60mL OMNIPAQUE  IOHEXOL  350 MG/ML SOLN COMPARISON:  Chest radiograph 08/29/2014. CT abdomen and pelvis 07/16/2024 FINDINGS: Cardiovascular: Technically adequate study with good opacification of the central and segmental pulmonary arteries. No focal filling defects. No evidence of significant pulmonary embolus. Normal heart size. No pericardial effusions.  Normal caliber thoracic aorta. No aortic dissection. Calcification in the aorta and coronary arteries. Great vessel origins are patent. Mediastinum/Nodes: No enlarged mediastinal, hilar, or axillary lymph nodes. Thyroid  gland, trachea, and esophagus demonstrate no significant findings. Lungs/Pleura: Patchy infiltrates in the lung bases may represent multifocal pneumonia or atelectasis. No pleural effusion or pneumothorax. Upper Abdomen: No acute abnormality. Musculoskeletal: Degenerative changes in the spine. No acute bony abnormalities. Review of the MIP images confirms the above findings. IMPRESSION: 1. No evidence of significant pulmonary embolus. 2. Patchy infiltrates in the lung bases may represent multifocal pneumonia  or atelectasis. 3. Aortic atherosclerosis. Electronically Signed   By: Elsie Gravely M.D.   On: 07/16/2024 18:07   CT ABDOMEN PELVIS W CONTRAST Result Date: 07/16/2024 CLINICAL DATA:  Back pain and flank pain EXAM: CT ABDOMEN AND PELVIS WITH CONTRAST TECHNIQUE: Multidetector CT imaging of the abdomen and pelvis was performed using the standard protocol following bolus administration of intravenous contrast. RADIATION DOSE REDUCTION: This exam was performed according to the departmental dose-optimization program which includes automated exposure control, adjustment of the mA and/or kV according to patient size and/or use of iterative reconstruction technique. CONTRAST:  OMNIPAQUE  IOHEXOL  300 MG/ML  SOLN COMPARISON:  CT abdomen and pelvis 02/12/2024 FINDINGS: Lower chest: No acute abnormality. Hepatobiliary: No focal liver abnormality is seen. No gallstones, gallbladder wall thickening, or biliary dilatation. Pancreas: Unremarkable. No pancreatic ductal dilatation or surrounding inflammatory changes. Spleen: The spleen is moderately enlarged, unchanged. Adrenals/Urinary Tract: There is a subcentimeter cyst in the left kidney. Otherwise, the kidneys, adrenal glands and bladder are within  normal limits. Stomach/Bowel: Stomach is within normal limits. Appendix appears normal. There is wall thickening versus normal under distension of the sigmoid colon. There is no bowel obstruction, pneumatosis or free air. There are scattered sigmoid colon diverticula. Vascular/Lymphatic: Aortic atherosclerosis. No enlarged abdominal or pelvic lymph nodes. Reproductive: Prostate gland is mildly enlarged. Other: There are small fat containing bilateral inguinal and umbilical hernias. There is no ascites. Musculoskeletal: Degenerative changes affect the spine. There is scoliosis of the thoracolumbar spine, unchanged. IMPRESSION: 1. Wall thickening versus normal under distension of the sigmoid colon. Correlate clinically for colitis. 2. Stable splenomegaly. 3. Aortic atherosclerosis. Aortic Atherosclerosis (ICD10-I70.0). Electronically Signed   By: Greig Pique M.D.   On: 07/16/2024 17:07     Procedures   Medications Ordered in the ED  iohexol  (OMNIPAQUE ) 300 MG/ML solution 100 mL (100 mLs Intravenous Contrast Given 07/16/24 1654)  iohexol  (OMNIPAQUE ) 350 MG/ML injection 60 mL (60 mLs Intravenous Contrast Given 07/16/24 1753)                                    Medical Decision Making Amount and/or Complexity of Data Reviewed Labs: ordered. Radiology: ordered.  Risk Prescription drug management.   This patient presents to the ED for concern of back pain, flank pain differential diagnosis includes cauda equina syndrome, vertebral osteomyelitis, epidural abscess, acute appendicitis, cholecystitis, spinal stricture, diverticulitis, testicular torsion, pyelonephritis, kidney stone, pancreatitis, mesenteric ischemia, pulmonary embolus    Additional history obtained:  Additional history obtained from family External records from outside source obtained and reviewed including medical records   Lab Tests:  I Ordered, and personally interpreted labs.  The pertinent results include: No  leukocytosis, no anemia, normal kidney function liver function, unremarkable electrolytes, negative lipase, unremarkable urinalysis   Imaging Studies ordered:  I ordered imaging studies including CTA chest, CT abdomen and pelvis I independently visualized and interpreted imaging which showed no pulmonary embolus, no acute intra-abdominal surgical process I agree with the radiologist interpretation   Problem List / ED Course:  Patient is doing well at this time and is stable for discharge home.  Discussed with patient that all workup in Emergency Department has been unremarkable.  CT of the chest demonstrated no indication of pulmonary embolus.  Suspect that the infiltrates in the lung bases are secondary to atelectasis and do not suspect pneumonia at this point.  He has no leukocytosis, fever, productive cough.  Patient had no acute surgical findings within the CT scan abdomen and pelvis.  His splenomegaly is stable at this point.  He has no indication for underlying etiology such as ACS.  There is been no chest pain or shortness of breath.  He has had no exertional component of his pain.  Pain is reproducible with movement and suspect musculoskeletal pain at this time.  Will continue symptomatic treatment outpatient basis recommend close follow-up with his primary care doctor.  Strict return precautions were discussed for any new or worsening symptoms.  Patient voiced understanding the plan and had no additional questions. Patient was fully evaluated by attending physician who is in agreement to plan at this time.    Social Determinants of Health:  None        Final diagnoses:  Flank pain  Acute bilateral low back pain without sciatica    ED Discharge Orders          Ordered    methocarbamol  (ROBAXIN ) 500 MG tablet  2 times daily        07/16/24 1815               Iwalani Templeton D, PA-C 07/16/24 1819    Suzette Pac, MD 07/18/24 1352

## 2024-08-08 ENCOUNTER — Emergency Department (HOSPITAL_COMMUNITY)
Admission: EM | Admit: 2024-08-08 | Discharge: 2024-08-08 | Disposition: A | Discharge status: Home / Self Care | Source: Ambulatory Visit | Attending: Emergency Medicine | Admitting: Emergency Medicine

## 2024-08-08 ENCOUNTER — Emergency Department (HOSPITAL_COMMUNITY)

## 2024-08-08 ENCOUNTER — Ambulatory Visit: Payer: Self-pay

## 2024-08-08 ENCOUNTER — Encounter (HOSPITAL_COMMUNITY): Payer: Self-pay

## 2024-08-08 ENCOUNTER — Other Ambulatory Visit: Payer: Self-pay

## 2024-08-08 DIAGNOSIS — N50811 Right testicular pain: Secondary | ICD-10-CM | POA: Diagnosis not present

## 2024-08-08 DIAGNOSIS — N433 Hydrocele, unspecified: Secondary | ICD-10-CM | POA: Diagnosis not present

## 2024-08-08 DIAGNOSIS — Z7982 Long term (current) use of aspirin: Secondary | ICD-10-CM | POA: Insufficient documentation

## 2024-08-08 DIAGNOSIS — N5089 Other specified disorders of the male genital organs: Secondary | ICD-10-CM | POA: Diagnosis not present

## 2024-08-08 DIAGNOSIS — N451 Epididymitis: Secondary | ICD-10-CM | POA: Diagnosis not present

## 2024-08-08 LAB — URINALYSIS, W/ REFLEX TO CULTURE (INFECTION SUSPECTED)
Bilirubin Urine: NEGATIVE
Glucose, UA: NEGATIVE mg/dL
Hgb urine dipstick: NEGATIVE
Ketones, ur: NEGATIVE mg/dL
Leukocytes,Ua: NEGATIVE
Nitrite: NEGATIVE
Protein, ur: 30 mg/dL — AB
Specific Gravity, Urine: 1.029 (ref 1.005–1.030)
pH: 5 (ref 5.0–8.0)

## 2024-08-08 MED ORDER — LEVOFLOXACIN 500 MG PO TABS: 500.0000 mg | ORAL_TABLET | Freq: Every day | ORAL | 0 refills | Status: DC

## 2024-08-08 NOTE — Discharge Instructions (Addendum)
 Please take all antibiotics as directed.  Follow-up closely with urology on an outpatient basis.  Return to emergency department immediately for any new or worsening symptoms as discussed at the bedside.

## 2024-08-08 NOTE — Telephone Encounter (Signed)
 FYI Only or Action Required?: Action required by provider: request for appointment.  Patient was last seen in primary care on 03/25/2024 by Steven Comer BRAVO, MD.  Called Nurse Triage reporting Testicle Pain.  Symptoms began yesterday.  Interventions attempted: Nothing.  Symptoms are: gradually worsening.  Triage Disposition: Go to ED Now (Notify PCP)  Patient/caregiver understands and will follow disposition?: No, refuses dispositionCopied from CRM #[id number]. Topic: Clinical - Red Word Triage >> Aug 08, 2024 12:23 PM Aisha D wrote: Red Word that prompted transfer to Nurse Triage: swelling, pain  Pt stated that his right testicle is swollen and is extremely painful. Pt wanted to speak with provider or get an appt scheduled for today if possible. Reason for Disposition  Swollen scrotum  Answer Assessment - Initial Assessment Questions Sitting :pain -3. Touch-pain 9. Not sure if discolored. Pt noticed it yesterday while mowing  yard. RN advised ED. Pt refused and wants to be seen in office. RN notified CAL.       1. LOCATION and RADIATION: Where is the pain located?      Right testicle 2. QUALITY: What does the pain feel like?  (e.g., sharp, dull, aching, burning)     Very sore 3. SEVERITY: How bad is the pain?  (Scale 1-10; or mild, moderate, severe)   - MILD (1-3): doesn't interfere with normal activities    - MODERATE (4-7): interferes with normal activities (e.g., work or school) or awakens from sleep   - SEVERE (8-10): excruciating pain, unable to do any normal activities, difficulty walking     3 -9 4. ONSET: When did the pain start?     yesterday 5. PATTERN: Does it come and go, or has it been constant since it started?     Only when touched/bumped 6. SCROTAL APPEARANCE: What does the scrotum look like? Is there any swelling or redness?      More swollen than other 7. HERNIA: Has a doctor ever told you that you have a hernia?     na 8. OTHER SYMPTOMS:  Do you have any other symptoms? (e.g., fever, abdominal pain, vomiting, difficulty passing urine)     Swelling/ sore  Protocols used: Scrotal Pain-A-AH

## 2024-08-08 NOTE — ED Triage Notes (Signed)
 Pt stated that his right testicle started swelling and becoming sore yesterday. No trauma that pt can remember

## 2024-08-08 NOTE — ED Provider Notes (Signed)
 [place] EMERGENCY DEPARTMENT AT [facility] Provider Note   CSN: [id number] Arrival date & time: 08/08/24  1409     Patient presents with: Testicle Pain   [name] is a 78 y.o. male.   Patient is a 78 year old male who presents emergency department chief complaint of pain to the right testicle which has been ongoing for approximate the past 3 days.  Patient notes that the swelling and pain has improved today.  He notes that he has had no dysuria or hematuria.  He denies any associate abdominal pain, flank pain.  He notes that he has had no recent falls or blunt trauma to his testicles.  There is been no similar symptoms in the past.  He does note he has previous vasectomy but no other previous surgeries to his testicles or scrotum.  There is been no associated fever or chills.   Testicle Pain       Prior to Admission medications   Medication Sig Start Date End Date Taking? Authorizing Provider  levofloxacin  (LEVAQUIN ) 500 MG tablet Take 1 tablet (500 mg total) by mouth daily. 08/08/24  Yes Daralene Bruckner D, PA-C  acetaminophen  (TYLENOL ) 650 MG CR tablet Take 1,300 mg by mouth every 8 (eight) hours as needed for pain.    [provider]  allopurinol  (ZYLOPRIM ) 300 MG tablet TAKE ONE TABLET BY MOUTH DAILY 06/24/24   Tabori, Katherine E, MD  aspirin  EC 81 MG tablet Take 81 mg by mouth daily. Swallow whole.    [provider]  EPINEPHrine  0.3 mg/0.3 mL IJ SOAJ injection Inject 0.3 mg into the muscle as needed for anaphylaxis. 03/25/18   [provider]  lisinopril  (ZESTRIL ) 10 MG tablet TAKE ONE TABLET BY MOUTH DAILY 03/13/24   Tabori, Katherine E, MD  methocarbamol  (ROBAXIN ) 500 MG tablet Take 1 tablet (500 mg total) by mouth 2 (two) times daily. 07/16/24   Daralene Bruckner BIRCH, PA-C  metoprolol  succinate (TOPROL -XL) 25 MG 24 hr tablet TAKE ONE TABLET BY MOUTH DAILY 06/24/24   Tabori, Katherine E, MD  simvastatin  (ZOCOR ) 40 MG tablet Take 1  tablet (40 mg total) by mouth daily. 03/25/24   Mahlon Comer BRAVO, MD    Allergies: Bee venom    Review of Systems  Genitourinary:  Positive for testicular pain.  All other systems reviewed and are negative.   Updated Vital Signs BP (!) 179/85   Pulse (!) 59   Temp 98.2 F (36.8 C) (Oral)   Resp 15   Ht 6' (1.829 m)   Wt 96.2 kg   SpO2 96%   BMI 28.75 kg/m   Physical Exam Vitals and nursing note reviewed.  Constitutional:      Appearance: Normal appearance.  HENT:     Head: Normocephalic and atraumatic.  Eyes:     Extraocular Movements: Extraocular movements intact.     Conjunctiva/sclera: Conjunctivae normal.     Pupils: Pupils are equal, round, and reactive to light.  Cardiovascular:     Rate and Rhythm: Normal rate and regular rhythm.     Pulses: Normal pulses.  Pulmonary:     Effort: Pulmonary effort is normal. No respiratory distress.  Abdominal:     General: Abdomen is flat. Bowel sounds are normal. There is no distension.     Palpations: Abdomen is soft.     Tenderness: There is no abdominal tenderness. There is no guarding.  Genitourinary:    Comments: Scrotal exam with mild overlying erythema to the right  side of the scrotum, mild tenderness along the epididymis, cremasteric reflex intact bilaterally, no hernia appreciated Musculoskeletal:        General: Normal range of motion.  Skin:    General: Skin is warm and dry.  Neurological:     General: No focal deficit present.     Mental Status: He is alert and oriented to person, place, and time. Mental status is at baseline.  Psychiatric:        Mood and Affect: Mood normal.        Behavior: Behavior normal.        Thought Content: Thought content normal.        Judgment: Judgment normal.     (all labs ordered are listed, but only abnormal results are displayed) Labs Reviewed  URINALYSIS, W/ REFLEX TO CULTURE (INFECTION SUSPECTED) - Abnormal; Notable for the following components:      Result Value    Protein, ur 30 (*)    Bacteria, UA RARE (*)    All other components within normal limits    EKG: None  Radiology: US  SCROTUM W/DOPPLER Result Date: 08/08/2024 CLINICAL DATA:  RIGHT testicular swelling EXAM: SCROTAL ULTRASOUND DOPPLER ULTRASOUND OF THE TESTICLES TECHNIQUE: Complete ultrasound examination of the testicles, epididymis, and other scrotal structures was performed. Color and spectral Doppler ultrasound were also utilized to evaluate blood flow to the testicles. COMPARISON:  None Available. FINDINGS: Right testicle Measurements: Normal in size homogeneous echotexture measuring 4.2 x 3.1 x 2.4 cm. No mass or microlithiasis visualized. Left testicle Measurements: Normal in size and homogeneous in echotexture measuring 4.0 x 2.2 x 2.4 cm. No mass or microlithiasis visualized. Right epididymis:  Prominent RIGHT epididymis. Left epididymis:  Normal in size and appearance. Hydrocele:  Bilateral hydroceles.  Larger on the RIGHT. Varicocele:  Small LEFT varicocele. Pulsed Doppler interrogation of both testes demonstrates normal low resistance arterial and venous waveforms bilaterally. IMPRESSION: 1. Normal testicles.  No evidence of torsion.  Normal vascular flow. 2. Prominent RIGHT epididymis.  Potential epididymitis. 3. Bilateral hydroceles, greater in volume on the RIGHT. Electronically Signed   By: Jackquline Boxer M.D.   On: 08/08/2024 15:41     Procedures   Medications Ordered in the ED - No data to display                                  Medical Decision Making Patient is doing well at this time and is stable for discharge home.  Discussed with patient that ultrasound was consistent with epididymitis.  Will cover accordingly with Levaquin  at this point.  Patient has stable vital signs with no indication for sepsis.  Did recommend close follow-up with urology on an outpatient basis for continued evaluation.  He was made aware of the hydroceles as well.  He had no indication for  testicular torsion.  No hernia was appreciated on exam.  Do not suspect underlying etiology such as cellulitis or abscess.  He had no indication for involvement of the perineum and no signs of Fournier's gangrene.  Strict turn precautions were discussed for any new or worsening symptoms.  Patient voiced understanding and had no additional questions.  Amount and/or Complexity of Data Reviewed Radiology: ordered.  Risk Prescription drug management.        Final diagnoses:  Epididymitis    ED Discharge Orders          Ordered    levofloxacin  (LEVAQUIN ) 500  MG tablet  Daily        08/08/24 1657               Payton Prinsen D, PA-C 08/08/24 1816    Suzette Pac, MD 08/11/24 1705

## 2024-08-08 NOTE — Telephone Encounter (Signed)
 Called patient and relayed this message, patient voiced understanding and will go soon

## 2024-08-08 NOTE — Telephone Encounter (Signed)
 Based on report of swollen, painful, discolored testicles pt needs to go straight to ER as this is a medical emergency and if not handled in a timely manner, could result in loss of testicles or testicular function

## 2024-09-02 ENCOUNTER — Ambulatory Visit

## 2024-09-03 ENCOUNTER — Ambulatory Visit (HOSPITAL_COMMUNITY)
Admission: RE | Admit: 2024-09-03 | Discharge: 2024-09-03 | Disposition: A | Discharge status: Home / Self Care | Source: Ambulatory Visit | Attending: Oncology | Admitting: Oncology

## 2024-09-03 ENCOUNTER — Inpatient Hospital Stay: Attending: Oncology

## 2024-09-03 DIAGNOSIS — J8410 Pulmonary fibrosis, unspecified: Secondary | ICD-10-CM | POA: Diagnosis not present

## 2024-09-03 DIAGNOSIS — N289 Disorder of kidney and ureter, unspecified: Secondary | ICD-10-CM | POA: Diagnosis not present

## 2024-09-03 DIAGNOSIS — Z79899 Other long term (current) drug therapy: Secondary | ICD-10-CM | POA: Diagnosis not present

## 2024-09-03 DIAGNOSIS — R161 Splenomegaly, not elsewhere classified: Secondary | ICD-10-CM | POA: Diagnosis not present

## 2024-09-03 DIAGNOSIS — J984 Other disorders of lung: Secondary | ICD-10-CM | POA: Insufficient documentation

## 2024-09-03 DIAGNOSIS — I700 Atherosclerosis of aorta: Secondary | ICD-10-CM | POA: Diagnosis not present

## 2024-09-03 DIAGNOSIS — Z7982 Long term (current) use of aspirin: Secondary | ICD-10-CM | POA: Insufficient documentation

## 2024-09-03 DIAGNOSIS — R911 Solitary pulmonary nodule: Secondary | ICD-10-CM | POA: Diagnosis not present

## 2024-09-03 LAB — CBC WITH DIFFERENTIAL/PLATELET
Abs Immature Granulocytes: 0.02 K/uL (ref 0.00–0.07)
Basophils Absolute: 0 K/uL (ref 0.0–0.1)
Basophils Relative: 0 %
Eosinophils Absolute: 0.1 K/uL (ref 0.0–0.5)
Eosinophils Relative: 2 %
HCT: 43.7 % (ref 39.0–52.0)
Hemoglobin: 14.2 g/dL (ref 13.0–17.0)
Immature Granulocytes: 0 %
Lymphocytes Relative: 21 %
Lymphs Abs: 1.4 K/uL (ref 0.7–4.0)
MCH: 28.1 pg (ref 26.0–34.0)
MCHC: 32.5 g/dL (ref 30.0–36.0)
MCV: 86.5 fL (ref 80.0–100.0)
Monocytes Absolute: 0.6 K/uL (ref 0.1–1.0)
Monocytes Relative: 9 %
Neutro Abs: 4.8 K/uL (ref 1.7–7.7)
Neutrophils Relative %: 68 %
Platelets: 163 K/uL (ref 150–400)
RBC: 5.05 MIL/uL (ref 4.22–5.81)
RDW: 14.1 % (ref 11.5–15.5)
WBC: 7 K/uL (ref 4.0–10.5)
nRBC: 0 % (ref 0.0–0.2)

## 2024-09-03 LAB — COMPREHENSIVE METABOLIC PANEL WITH GFR
ALT: 16 U/L (ref 0–44)
AST: 17 U/L (ref 15–41)
Albumin: 4 g/dL (ref 3.5–5.0)
Alkaline Phosphatase: 93 U/L (ref 38–126)
Anion gap: 11 (ref 5–15)
BUN: 15 mg/dL (ref 8–23)
CO2: 26 mmol/L (ref 22–32)
Calcium: 8.9 mg/dL (ref 8.9–10.3)
Chloride: 103 mmol/L (ref 98–111)
Creatinine, Ser: 0.88 mg/dL (ref 0.61–1.24)
GFR, Estimated: >60 mL/min (ref >60)
Glucose, Bld: 81 mg/dL (ref 70–99)
Potassium: 3.7 mmol/L (ref 3.5–5.1)
Sodium: 140 mmol/L (ref 135–145)
Total Bilirubin: 0.9 mg/dL (ref 0.0–1.2)
Total Protein: 6.6 g/dL (ref 6.5–8.1)

## 2024-09-03 MED ORDER — IOHEXOL 300 MG/ML  SOLN
100.0000 mL | Freq: Once | INTRAMUSCULAR | Status: AC | PRN
Start: 2024-09-03 — End: 2024-09-03
  Administered 2024-09-03: 100 mL via INTRAVENOUS

## 2024-09-11 ENCOUNTER — Telehealth: Payer: Self-pay

## 2024-09-11 NOTE — Telephone Encounter (Signed)
 Patient has an appointment on 09/25/2024 provider is now not in office on that day and we need to R/S  LM to call back so we could reschedule the appointment

## 2024-09-14 NOTE — Progress Notes (Signed)
 [facility] at [facility]  HEMATOLOGY FOLLOW-UP VISIT  [name] BRAVO, MD  REASON FOR FOLLOW-UP: Splenomegaly  ASSESSMENT & PLAN:  Patient is a 78 year old male following for splenomegaly.      Return to clinic in 6 months with CT scan  No orders of the defined types were placed in this encounter.   The total time spent in the appointment was *** minutes encounter with patients including review of chart and various tests results, discussions about plan of care and coordination of care plan   All questions were answered. The patient knows to call the clinic with any problems, questions or concerns. No barriers to learning was detected.   [name],acting as a Neurosurgeon for Steven Dry, MD.,have documented all relevant documentation on the behalf of Steven Dry, MD,as directed by  Steven Dry, MD while in the presence of Steven Dry, MD.  ***  [address] R Bean 9/28/20255:10 PM   INTERVAL HISTORY: [name] 78 y.o. male following for splenomegaly.  He is accompanied by his wife today.  I have reviewed the past medical history, past surgical history, social history and family history with the patient   ALLERGIES:  is allergic to bee venom.  MEDICATIONS:  Current Outpatient Medications  Medication Sig Dispense Refill   acetaminophen  (TYLENOL ) 650 MG CR tablet Take 1,300 mg by mouth every 8 (eight) hours as needed for pain.     allopurinol  (ZYLOPRIM ) 300 MG tablet TAKE ONE TABLET BY MOUTH DAILY 90 tablet 0   aspirin  EC 81 MG tablet Take 81 mg by mouth daily. Swallow whole.     EPINEPHrine  0.3 mg/0.3 mL IJ SOAJ injection Inject 0.3 mg into the muscle as needed for anaphylaxis.  0   levofloxacin  (LEVAQUIN ) 500 MG tablet Take 1 tablet (500 mg total) by mouth daily. 10 tablet 0   lisinopril  (ZESTRIL ) 10 MG tablet TAKE ONE TABLET BY MOUTH DAILY 90 tablet 3   methocarbamol  (ROBAXIN ) 500 MG tablet Take 1 tablet (500 mg total) by  mouth 2 (two) times daily. 20 tablet 0   metoprolol  succinate (TOPROL -XL) 25 MG 24 hr tablet TAKE ONE TABLET BY MOUTH DAILY 90 tablet 0   simvastatin  (ZOCOR ) 40 MG tablet Take 1 tablet (40 mg total) by mouth daily. 90 tablet 1   No current facility-administered medications for this visit.     REVIEW OF SYSTEMS:   Constitutional: Denies fevers, chills or night sweats Eyes: Denies blurriness of vision Ears, nose, mouth, throat, and face: Denies mucositis or sore throat Respiratory: Denies cough, dyspnea or wheezes Cardiovascular: Denies palpitation, chest discomfort or lower extremity swelling Gastrointestinal:  Denies nausea, heartburn or change in bowel habits Skin: Denies abnormal skin rashes Lymphatics: Denies new lymphadenopathy or easy bruising Neurological:Denies numbness, tingling or new weaknesses Behavioral/Psych: Mood is stable, no new changes  All other systems were reviewed with the patient and are negative.  PHYSICAL EXAMINATION:   There were no vitals filed for this visit.  GENERAL:alert, no distress and comfortable LYMPH:  no palpable lymphadenopathy in the cervical, axillary or inguinal LUNGS: clear to auscultation and percussion with normal breathing effort HEART: regular rate & rhythm and no murmurs and no lower extremity edema ABDOMEN:abdomen soft, non-tender and normal bowel sounds, diastases recti present with some herniation, could not palpate the spleen-restricted by abdominal obesity. Musculoskeletal:no cyanosis of digits and no clubbing  NEURO: alert & oriented x 3 with fluent speech  LABORATORY DATA:  I have reviewed the data  as listed  Lab Results  Component Value Date   WBC 7.0 09/03/2024   NEUTROABS 4.8 09/03/2024   HGB 14.2 09/03/2024   HCT 43.7 09/03/2024   MCV 86.5 09/03/2024   PLT 163 09/03/2024       Chemistry      Component Value Date/Time   NA 140 09/03/2024 1155   K 3.7 09/03/2024 1155   CL 103 09/03/2024 1155   CO2 26  09/03/2024 1155   BUN 15 09/03/2024 1155   CREATININE 0.88 09/03/2024 1155      Component Value Date/Time   CALCIUM 8.9 09/03/2024 1155   ALKPHOS 93 09/03/2024 1155   AST 17 09/03/2024 1155   ALT 16 09/03/2024 1155   BILITOT 0.9 09/03/2024 1155      Latest Reference Range & Units 02/27/24 14:58  EBV VCA IgG 0.0 - 17.9 U/mL >600.0 (H)  EBV VCA IgM 0.0 - 35.9 U/mL <36.0  EBV NA IgG 0.0 - 17.9 U/mL 170.0 (H)  EBV DNA QN by PCR Negative IU/mL Negative  (H): Data is abnormally high   Flow cytometry: 02/27/2024: DIAGNOSIS:   - No abnormal B or T-cell population identified    GATING AND PHENOTYPIC ANALYSIS:   Gated population: Flow cytometric immunophenotyping is performed using  antibodies to the antigens listed in the table below. Electronic gates  are placed around a cell cluster displaying light scatter properties  corresponding to: lymphocytes   Abnormal Cells in gated population: N/A   Phenotype of Abnormal Cells: N/A

## 2024-09-15 ENCOUNTER — Inpatient Hospital Stay: Admitting: Oncology

## 2024-09-15 VITALS — BP 203/98 | HR 56 | Temp 97.6°F | Resp 20 | Wt 208.0 lb

## 2024-09-15 DIAGNOSIS — N289 Disorder of kidney and ureter, unspecified: Secondary | ICD-10-CM | POA: Diagnosis not present

## 2024-09-15 DIAGNOSIS — Z79899 Other long term (current) drug therapy: Secondary | ICD-10-CM | POA: Diagnosis not present

## 2024-09-15 DIAGNOSIS — R161 Splenomegaly, not elsewhere classified: Secondary | ICD-10-CM | POA: Diagnosis not present

## 2024-09-15 DIAGNOSIS — Z7982 Long term (current) use of aspirin: Secondary | ICD-10-CM | POA: Diagnosis not present

## 2024-09-15 DIAGNOSIS — J984 Other disorders of lung: Secondary | ICD-10-CM | POA: Diagnosis not present

## 2024-09-15 DIAGNOSIS — J8410 Pulmonary fibrosis, unspecified: Secondary | ICD-10-CM | POA: Diagnosis not present

## 2024-09-23 ENCOUNTER — Other Ambulatory Visit: Payer: Self-pay | Admitting: Family Medicine

## 2024-09-23 DIAGNOSIS — I10 Essential (primary) hypertension: Secondary | ICD-10-CM

## 2024-09-25 ENCOUNTER — Encounter: Admitting: Family Medicine

## 2024-10-08 ENCOUNTER — Ambulatory Visit: Admitting: Family Medicine

## 2024-10-08 ENCOUNTER — Encounter: Payer: Self-pay | Admitting: Family Medicine

## 2024-10-08 VITALS — BP 172/80 | HR 77 | Temp 98.0°F | Ht 72.0 in | Wt 206.4 lb

## 2024-10-08 DIAGNOSIS — I10 Essential (primary) hypertension: Secondary | ICD-10-CM

## 2024-10-08 DIAGNOSIS — Z0000 Encounter for general adult medical examination without abnormal findings: Secondary | ICD-10-CM

## 2024-10-08 LAB — BASIC METABOLIC PANEL WITH GFR
BUN: 15 mg/dL (ref 6–23)
CO2: 30 meq/L (ref 19–32)
Calcium: 9.3 mg/dL (ref 8.4–10.5)
Chloride: 104 meq/L (ref 96–112)
Creatinine, Ser: 1.02 mg/dL (ref 0.40–1.50)
GFR: 71.72 mL/min (ref >60.00)
Glucose, Bld: 77 mg/dL (ref 70–99)
Potassium: 3.9 meq/L (ref 3.5–5.1)
Sodium: 140 meq/L (ref 135–145)

## 2024-10-08 LAB — LIPID PANEL
Cholesterol: 116 mg/dL (ref 0–200)
HDL: 37 mg/dL — ABNORMAL LOW (ref >39.00)
LDL Cholesterol: 45 mg/dL (ref 0–99)
NonHDL: 78.66
Total CHOL/HDL Ratio: 3
Triglycerides: 170 mg/dL — ABNORMAL HIGH (ref 0.0–149.0)
VLDL: 34 mg/dL (ref 0.0–40.0)

## 2024-10-08 LAB — CBC WITH DIFFERENTIAL/PLATELET
Basophils Absolute: 0 K/uL (ref 0.0–0.1)
Basophils Relative: 0.6 % (ref 0.0–3.0)
Eosinophils Absolute: 0.1 K/uL (ref 0.0–0.7)
Eosinophils Relative: 1.9 % (ref 0.0–5.0)
HCT: 44.3 % (ref 39.0–52.0)
Hemoglobin: 14.5 g/dL (ref 13.0–17.0)
Lymphocytes Relative: 21.7 % (ref 12.0–46.0)
Lymphs Abs: 1.6 K/uL (ref 0.7–4.0)
MCHC: 32.7 g/dL (ref 30.0–36.0)
MCV: 84.9 fl (ref 78.0–100.0)
Monocytes Absolute: 0.7 K/uL (ref 0.1–1.0)
Monocytes Relative: 9.9 % (ref 3.0–12.0)
Neutro Abs: 4.9 K/uL (ref 1.4–7.7)
Neutrophils Relative %: 65.9 % (ref 43.0–77.0)
Platelets: 195 K/uL (ref 150.0–400.0)
RBC: 5.22 Mil/uL (ref 4.22–5.81)
RDW: 14.6 % (ref 11.5–15.5)
WBC: 7.4 K/uL (ref 4.0–10.5)

## 2024-10-08 LAB — HEPATIC FUNCTION PANEL
ALT: 14 U/L (ref 0–53)
AST: 13 U/L (ref 0–37)
Albumin: 4.3 g/dL (ref 3.5–5.2)
Alkaline Phosphatase: 94 U/L (ref 39–117)
Bilirubin, Direct: 0.2 mg/dL (ref 0.0–0.3)
Total Bilirubin: 0.7 mg/dL (ref 0.2–1.2)
Total Protein: 6.8 g/dL (ref 6.0–8.3)

## 2024-10-08 LAB — TSH: TSH: 1.28 u[IU]/mL (ref 0.35–5.50)

## 2024-10-08 MED ORDER — LISINOPRIL 20 MG PO TABS: 20.0000 mg | ORAL_TABLET | Freq: Every day | ORAL | 1 refills | Status: AC

## 2024-10-08 NOTE — Patient Instructions (Signed)
 Follow up in 4 weeks to recheck blood pressure We'll notify you of your lab results and make any changes if needed INCREASE the Lisinopril  to 20mg  daily- 2 of what you have at home and 1 of the new prescription Try and limit your salt intake and increase your water  Continue to work on healthy diet and regular exercise- you can do it! Call with any questions or concerns Stay Safe!  Stay Healthy! Happy Fall!!

## 2024-10-08 NOTE — Progress Notes (Signed)
   Subjective:    Patient ID: [name], male    DOB: 07-Apr-1946, 78 y.o.   MRN: [id number]  HPI CPE- UTD on colonoscopy, PNA  Patient Care Team    Relationship Specialty Notifications Start End  Mahlon Comer BRAVO, MD PCP - General Family Medicine  01/19/17   Alix Charleston, MD Consulting Physician Neurosurgery  01/19/17   Charls Pearla LABOR, MD (Inactive) Attending Physician Cardiology  01/19/17   Golda Claudis PENNER, MD (Inactive) Consulting Physician Gastroenterology  01/19/17   Pandora Cadet, [facility] Pharmacist Pharmacist  04/09/20    Comment: PHONE NUMBER [phone]    Health Maintenance  Topic Date Due   DTaP/Tdap/Td (2 - Td or Tdap) 07/29/2017   Zoster Vaccines- Shingrix (2 of 2) 09/20/2021   Medicare Annual Wellness (AWV)  05/15/2024   Influenza Vaccine  Never done   COVID-19 Vaccine (3 - 2025-26 season) 08/18/2024   Colonoscopy  05/01/2028   Pneumococcal Vaccine: 50+ Years  Completed   Hepatitis C Screening  Completed   Meningococcal B Vaccine  Aged Out      Review of Systems Patient reports no vision/hearing changes, anorexia, fever ,adenopathy, persistant/recurrent hoarseness, swallowing issues, chest pain, palpitations, edema, persistant/recurrent cough, hemoptysis, dyspnea (rest,exertional, paroxysmal nocturnal), gastrointestinal  bleeding (melena, rectal bleeding), abdominal pain, excessive heart burn, GU symptoms (dysuria, hematuria, voiding/incontinence issues) syncope, focal weakness, memory loss, numbness & tingling, skin/hair/nail changes, depression, anxiety, abnormal bruising/bleeding, musculoskeletal symptoms/signs.     Objective:   Physical Exam General Appearance:    Alert, cooperative, no distress, appears stated age  Head:    Normocephalic, without obvious abnormality, atraumatic  Eyes:    PERRL, conjunctiva/corneas clear, EOM's intact both eyes       Ears:    Normal TM's and external ear canals, both ears  Nose:   Nares normal, septum midline, mucosa  normal, no drainage   or sinus tenderness  Throat:   Lips, mucosa, and tongue normal; teeth and gums normal  Neck:   Supple, symmetrical, trachea midline, no adenopathy;       thyroid :  No enlargement/tenderness/nodules  Back:     Symmetric, no curvature, ROM normal, no CVA tenderness  Lungs:     Clear to auscultation bilaterally, respirations unlabored  Chest wall:    No tenderness or deformity  Heart:    Regular rate and rhythm, S1 and S2 normal, no murmur, rub   or gallop  Abdomen:     Soft, non-tender, bowel sounds active all four quadrants,    no masses, no organomegaly  Genitalia:    deferred  Rectal:    Extremities:   Extremities normal, atraumatic, no cyanosis or edema  Pulses:   2+ and symmetric all extremities  Skin:   Skin color, texture, turgor normal, no rashes or lesions  Lymph nodes:   Cervical, supraclavicular, and axillary nodes normal  Neurologic:   CNII-XII intact. Normal strength, sensation and reflexes      throughout          Assessment & Plan:

## 2024-10-09 ENCOUNTER — Ambulatory Visit: Payer: Self-pay | Admitting: Family Medicine

## 2024-10-12 NOTE — Assessment & Plan Note (Signed)
 Deteriorated.  Pt's BP is elevated today despite feeling fine.  Admits to high salt diet.  Reviewed importance of limiting his sodium when possible.  Will increase Lisinopril  to 20mg  daily and monitor closely.

## 2024-10-12 NOTE — Assessment & Plan Note (Signed)
 Pt's PE WNL w/ exception of BP.  UTD on colonoscopy, PNA.  Check labs.  Anticipatory guidance provided.

## 2024-10-15 ENCOUNTER — Ambulatory Visit (INDEPENDENT_AMBULATORY_CARE_PROVIDER_SITE_OTHER)

## 2024-10-15 VITALS — Ht 72.0 in | Wt 206.0 lb

## 2024-10-15 DIAGNOSIS — Z0000 Encounter for general adult medical examination without abnormal findings: Secondary | ICD-10-CM

## 2024-10-15 NOTE — Patient Instructions (Signed)
 [name],  Thank you for taking the time for your Medicare Wellness Visit. I appreciate your continued commitment to your health goals. Please review the care plan we discussed, and feel free to reach out if I can assist you further.  Medicare recommends these wellness visits once per year to help you and your care team stay ahead of potential health issues. These visits are designed to focus on prevention, allowing your provider to concentrate on managing your acute and chronic conditions during your regular appointments.  Please note that Annual Wellness Visits do not include a physical exam. Some assessments may be limited, especially if the visit was conducted virtually. If needed, we may recommend a separate in-person follow-up with your provider.  Ongoing Care Seeing your primary care provider every 3 to 6 months helps us  monitor your health and provide consistent, personalized care. Last office visit on 10/08/2024.  Keep up the good work.  Referrals If a referral was made during today's visit and you haven't received any updates within two weeks, please contact the referred provider directly to check on the status.  Recommended Screenings:  Health Maintenance  Topic Date Due   DTaP/Tdap/Td vaccine (2 - Td or Tdap) 07/29/2017   Zoster (Shingles) Vaccine (2 of 2) 09/20/2021   Medicare Annual Wellness Visit  05/15/2024   Flu Shot  Never done   COVID-19 Vaccine (3 - 2025-26 season) 08/18/2024   Colon Cancer Screening  05/01/2028   Pneumococcal Vaccine for age over 87  Completed   Hepatitis C Screening  Completed   Meningitis B Vaccine  Aged Out       10/15/2024   10:44 AM  Advanced Directives  Does Patient Have a Medical Advance Directive? Yes  Type of Estate Agent of [place];Living will  Copy of Healthcare Power of Attorney in Chart? No - copy requested   Advance Care Planning is important because it: Ensures you receive medical care that aligns with  your values, goals, and preferences. Provides guidance to your family and loved ones, reducing the emotional burden of decision-making during critical moments.  Vision: Annual vision screenings are recommended for early detection of glaucoma, cataracts, and diabetic retinopathy. These exams can also reveal signs of chronic conditions such as diabetes and high blood pressure.  Dental: Annual dental screenings help detect early signs of oral cancer, gum disease, and other conditions linked to overall health, including heart disease and diabetes.  Please see the attached documents for additional preventive care recommendations.

## 2024-10-15 NOTE — Progress Notes (Signed)
 Subjective:   [name] is a 78 y.o. who presents for a Medicare Wellness preventive visit.  As a reminder, Annual Wellness Visits don't include a physical exam, and some assessments may be limited, especially if this visit is performed virtually. We may recommend an in-person follow-up visit with your provider if needed.  Visit Complete: Virtual I connected with  [name] on 10/15/24 by a audio enabled telemedicine application and verified that I am speaking with the correct person using two identifiers.  Patient Location: Home  Provider Location: Home Office  I discussed the limitations of evaluation and management by telemedicine. The patient expressed understanding and agreed to proceed.  Vital Signs: Because this visit was a virtual/telehealth visit, some criteria may be missing or patient reported. Any vitals not documented were not able to be obtained and vitals that have been documented are patient reported.  VideoDeclined- This patient declined Librarian, academic. Therefore the visit was completed with audio only.  Persons Participating in Visit: Patient.  AWV Questionnaire: No: Patient Medicare AWV questionnaire was not completed prior to this visit.  NO CHANGES IN ALCOHOL SCREENING FROM 10/06/2024-per pt  Cardiac Risk Factors include: advanced age (>61men, >48 women);male gender;dyslipidemia     Objective:    Today's Vitals   10/15/24 1040  Weight: 206 lb (93.4 kg)  Height: 6' (1.829 m)   Body mass index is 27.94 kg/m.     10/15/2024   10:44 AM 09/15/2024    2:47 PM 08/08/2024    2:14 PM 07/16/2024    3:56 PM 03/12/2024    1:18 PM 02/27/2024    2:25 PM 05/16/2023    8:36 AM  Advanced Directives  Does Patient Have a Medical Advance Directive? Yes Yes No No Yes Yes No  Type of Estate Agent of [place];Living will Healthcare Power of [place];Living will   Healthcare Power of [place];Living will  Healthcare Power of [place];Living will   Does patient want to make changes to medical advance directive?  No - Patient declined   No - Patient declined No - Patient declined   Copy of Healthcare Power of Attorney in Chart? No - copy requested No - copy requested   No - copy requested No - copy requested   Would patient like information on creating a medical advance directive?    No - Patient declined   No - Patient declined    Current Medications (verified) Outpatient Encounter Medications as of 10/15/2024  Medication Sig   acetaminophen  (TYLENOL ) 650 MG CR tablet Take 1,300 mg by mouth every 8 (eight) hours as needed for pain.   allopurinol  (ZYLOPRIM ) 300 MG tablet TAKE ONE TABLET BY MOUTH DAILY   aspirin  EC 81 MG tablet Take 81 mg by mouth daily. Swallow whole.   EPINEPHrine  0.3 mg/0.3 mL IJ SOAJ injection Inject 0.3 mg into the muscle as needed for anaphylaxis.   lisinopril  (ZESTRIL ) 20 MG tablet Take 1 tablet (20 mg total) by mouth daily.   methocarbamol  (ROBAXIN ) 500 MG tablet Take 1 tablet (500 mg total) by mouth 2 (two) times daily. (Patient taking differently: Take 500 mg by mouth 2 (two) times daily. Takes as needed)   metoprolol  succinate (TOPROL -XL) 25 MG 24 hr tablet TAKE ONE TABLET BY MOUTH DAILY   simvastatin  (ZOCOR ) 40 MG tablet Take 1 tablet (40 mg total) by mouth daily.   No facility-administered encounter medications on file as of 10/15/2024.    Allergies (verified) Bee venom  History: Past Medical History:  Diagnosis Date   Arthritis    Cellulitis and abscess of other specified site    Colon polyps    Gout    Hypertension    Neuromuscular disorder (HCC)    arthritis   Scoliosis    Splenomegaly    Past Surgical History:  Procedure Laterality Date   BACK SURGERY     BIOPSY  05/02/2023   Procedure: BIOPSY;  Surgeon: Eartha Angelia Sieving, MD;  [facility];  Service: Gastroenterology;;   COLONOSCOPY  01/10/2013   Procedure: COLONOSCOPY;   Surgeon: Claudis RAYMOND Rivet, MD;  [facility]: [facility];  Service: Endoscopy;  Laterality: N/A;  730   COLONOSCOPY N/A 08/21/2013   Procedure: COLONOSCOPY;  Surgeon: Claudis RAYMOND Rivet, MD;  [facility]: [facility];  Service: Endoscopy;  Laterality: N/A;  1030   COLONOSCOPY N/A 08/26/2015   Procedure: COLONOSCOPY;  Surgeon: Claudis RAYMOND Rivet, MD;  [facility]: [facility];  Service: Endoscopy;  Laterality: N/A;  240   COLONOSCOPY WITH PROPOFOL  N/A 03/16/2021   Procedure: COLONOSCOPY WITH PROPOFOL ;  Surgeon: Rivet Claudis RAYMOND, MD;  [facility]: [facility];  Service: Endoscopy;  Laterality: N/A;  patient knows to arrive at 0630.   COLONOSCOPY WITH PROPOFOL  N/A 05/02/2023   Procedure: COLONOSCOPY WITH PROPOFOL ;  Surgeon: Eartha Angelia Sieving, MD;  [facility]: [facility];  Service: Gastroenterology;  Laterality: N/A;  1:30PM ASA 2   colonscopy     PARTIAL KNEE ARTHROPLASTY Left 11/06/2017   Procedure: UNICOMPARTMENTAL KNEE;  Surgeon: Beverley Evalene BIRCH, MD;  Location[facility];  Service: Orthopedics;  Laterality: Left;   POLYPECTOMY  05/02/2023   Procedure: POLYPECTOMY INTESTINAL;  Surgeon: Eartha Angelia Sieving, MD;  [facility]: [facility];  Service: Gastroenterology;;   Family History  Problem Relation Age of Onset   Kidney disease Sister    Cancer Other        Family Hx of Cancer, CAD,Diabetes,Kidney Failure   Colon cancer Neg Hx    Social History   Socioeconomic History   Marital status: Married    Spouse name: Heron   Number of children: 2   Years of education: Not on file   Highest education level: 12th grade  Occupational History   Occupation: Retired     Comment: Music therapist facility  Tobacco Use   Smoking status: Former    Current packs/day: 0.00    Average packs/day: 3.0 packs/day for 39.0 years (117.0 ttl pk-yrs)    Types: Cigarettes    Start date: 12/18/1957    Quit date: 12/18/1996    Years since quitting: 27.8    Passive exposure: Current   Smokeless tobacco:  Never   Tobacco comments:    started smoking as a child  Vaping Use   Vaping status: Never Used  Substance and Sexual Activity   Alcohol use: Yes    Alcohol/week: 0.0 standard drinks of alcohol    Comment: beer occasionally   Drug use: No   Sexual activity: Yes  Other Topics Concern   Not on file  Social History Narrative   2 sons; 4 Grandchildren   Lives with wife/2025      Enjoys fishing    Social Drivers of Corporate Investment Banker Strain: Low Risk  (10/15/2024)   Overall Financial Resource Strain (CARDIA)    Difficulty of Paying Living Expenses: Not hard at all  Food Insecurity: No Food Insecurity (10/15/2024)   Hunger Vital Sign    Worried About Running  Out of Food in the Last Year: Never true    Ran Out of Food in the Last Year: Never true  Transportation Needs: No Transportation Needs (10/15/2024)   PRAPARE - Administrator, Civil Service (Medical): No    Lack of Transportation (Non-Medical): No  Physical Activity: Insufficiently Active (10/15/2024)   Exercise Vital Sign    Days of Exercise per Week: 4 days    Minutes of Exercise per Session: 10 min  Stress: No Stress Concern Present (10/15/2024)   [employer] of Occupational Health - Occupational Stress Questionnaire    Feeling of Stress: Not at all  Social Connections: Moderately Isolated (10/15/2024)   Social Connection and Isolation Panel    Frequency of Communication with Friends and Family: More than three times a week    Frequency of Social Gatherings with Friends and Family: More than three times a week    Attends Religious Services: Patient declined    Database Administrator or Organizations: No    Attends Engineer, Structural: Not on file    Marital Status: Married    Tobacco Counseling Counseling given: Not Answered Tobacco comments: started smoking as a child    Clinical Intake:  Pre-visit preparation completed: Yes  Pain : No/denies pain     BMI - recorded:  27.94 Nutritional Status: BMI 25 -29 Overweight Nutritional Risks: None Diabetes: No  Lab Results  Component Value Date   HGBA1C 5.9 07/26/2021     How often do you need to have someone help you when you read instructions, pamphlets, or other written materials from your doctor or pharmacy?: 1 - Never  Interpreter Needed?: No  Information entered by :: Damali Broadfoot, RMA   Activities of Daily Living     10/15/2024   10:42 AM  In your present state of health, do you have any difficulty performing the following activities:  Hearing? 0  Vision? 0  Difficulty concentrating or making decisions? 0  Walking or climbing stairs? 0  Dressing or bathing? 0  Doing errands, shopping? 0  Preparing Food and eating ? N  Using the Toilet? N  In the past six months, have you accidently leaked urine? N  Do you have problems with loss of bowel control? N  Managing your Medications? N  Managing your Finances? N  Housekeeping or managing your Housekeeping? N    Patient Care Team: Mahlon Comer BRAVO, MD as PCP - General (Family Medicine) Alix Charleston, MD as Consulting Physician (Neurosurgery) Charls Pearla LABOR, MD (Inactive) as Attending Physician (Cardiology) Golda Claudis PENNER, MD (Inactive) as Consulting Physician (Gastroenterology) Pandora Cadet, [facility] as Pharmacist (Pharmacist)  I have updated your Care Teams any recent Medical Services you may have received from other providers in the past year.     Assessment:   This is a routine wellness examination for Giancarlos.  Hearing/Vision screen Hearing Screening - Comments:: Denies hearing difficulties   Vision Screening - Comments:: Wears eyeglasses/[facility]-Per pt- up to date   Goals Addressed             This Visit's Progress    Patient Stated   On track    Maintain current lifestyle       Depression Screen     10/15/2024   10:48 AM 09/15/2024    2:43 PM 03/25/2024    9:03 AM 01/23/2024   10:32 AM 12/18/2023    11:05 AM 09/25/2023    9:06 AM 06/07/2023   12:50 PM  PHQ  2/9 Scores  PHQ - 2 Score 0 0 0 0 0 0 0  PHQ- 9 Score 0  0 0 0 0 0    Fall Risk     10/15/2024   10:45 AM 10/08/2024   10:29 AM 03/25/2024    9:02 AM 01/23/2024   10:31 AM 12/18/2023   11:04 AM  Fall Risk   Falls in the past year? 0 0 0 0 0  Number falls in past yr:  0 0 0 0  Injury with Fall?  0 0 0 0  Risk for fall due to :  No Fall Risks No Fall Risks  No Fall Risks  Follow up Falls evaluation completed;Falls prevention discussed Falls evaluation completed       MEDICARE RISK AT HOME:  Medicare Risk at Home Any stairs in or around the home?: Yes (back patio) If so, are there any without handrails?: No Home free of loose throw rugs in walkways, pet beds, electrical cords, etc?: Yes Adequate lighting in your home to reduce risk of falls?: Yes Life alert?: No Use of a cane, walker or w/c?: No Grab bars in the bathroom?: No Shower chair or bench in shower?: No Elevated toilet seat or a handicapped toilet?: No  TIMED UP AND GO:  Was the test performed?  No  Cognitive Function: 6CIT completed    07/25/2018    2:18 PM  MMSE - Mini Mental State Exam  Orientation to time 5  Orientation to Place 5  Registration 3  Attention/ Calculation 3  Recall 1  Language- name 2 objects 2  Language- repeat 1  Language- follow 3 step command 3  Language- read & follow direction 1  Write a sentence 1  Copy design 1  Total score 26        10/15/2024   10:46 AM 05/16/2023    8:34 AM 09/20/2022    7:36 AM 05/11/2022   10:02 AM 01/14/2020    8:29 AM  6CIT Screen  What Year? 0 points 0 points 0 points 0 points 0 points  What month? 0 points 0 points 0 points 0 points 0 points  What time? 0 points 0 points 0 points 0 points 0 points  Count back from 20 0 points 4 points 0 points 0 points 0 points  Months in reverse 0 points 2 points 2 points 2 points 0 points  Repeat phrase 0 points 0 points 0 points 0 points 0 points  Total  Score 0 points 6 points 2 points 2 points 0 points    Immunizations Immunization History  Administered Date(s) Administered   Moderna Sars-Covid-2 Vaccination 02/19/2020, 03/23/2020   Pneumococcal Conjugate-13 07/18/2017   Pneumococcal Polysaccharide-23 12/30/2018   Tdap 07/30/2007   Zoster Recombinant(Shingrix) 07/26/2021   Zoster, Live 01/19/2015    Screening Tests Health Maintenance  Topic Date Due   DTaP/Tdap/Td (2 - Td or Tdap) 07/29/2017   Zoster Vaccines- Shingrix (2 of 2) 09/20/2021   Medicare Annual Wellness (AWV)  05/15/2024   Influenza Vaccine  Never done   COVID-19 Vaccine (3 - 2025-26 season) 08/18/2024   Colonoscopy  05/01/2028   Pneumococcal Vaccine: 50+ Years  Completed   Hepatitis C Screening  Completed   Meningococcal B Vaccine  Aged Out    Health Maintenance Items Addressed: See Nurse Notes at the end of this note  Additional Screening:  Vision Screening: Recommended annual ophthalmology exams for early detection of glaucoma and other disorders of the eye. Is the patient up  to date with their annual eye exam?  Yes  Who is the provider or what is the name of the office in which the patient attends annual eye exams? [facility]/per pt- up to date.  Dental Screening: Recommended annual dental exams for proper oral hygiene  Community Resource Referral / Chronic Care Management: CRR required this visit?  No   CCM required this visit?  No   Plan:    I have personally reviewed and noted the following in the patient's chart:   Medical and social history Use of alcohol, tobacco or illicit drugs  Current medications and supplements including opioid prescriptions. Patient is not currently taking opioid prescriptions. Functional ability and status Nutritional status Physical activity Advanced directives List of other physicians Hospitalizations, surgeries, and ER visits in previous 12 months Vitals Screenings to include cognitive, depression, and  falls Referrals and appointments  In addition, I have reviewed and discussed with patient certain preventive protocols, quality metrics, and best practice recommendations. A written personalized care plan for preventive services as well as general preventive health recommendations were provided to patient.   Wandy Bossler L Cabell Lazenby, CMA   10/15/2024   After Visit Summary: (MyChart) Due to this being a telephonic visit, the after visit summary with patients personalized plan was offered to patient via MyChart   Notes: Patient declines all due vaccines.  Patient is up to date on all other health maintenance with no concerns to address today.

## 2024-11-05 DIAGNOSIS — D225 Melanocytic nevi of trunk: Secondary | ICD-10-CM | POA: Diagnosis not present

## 2024-11-05 DIAGNOSIS — D485 Neoplasm of uncertain behavior of skin: Secondary | ICD-10-CM | POA: Diagnosis not present

## 2024-11-07 ENCOUNTER — Ambulatory Visit (INDEPENDENT_AMBULATORY_CARE_PROVIDER_SITE_OTHER): Admitting: Family Medicine

## 2024-11-07 ENCOUNTER — Ambulatory Visit: Payer: Self-pay | Admitting: Family Medicine

## 2024-11-07 ENCOUNTER — Encounter: Payer: Self-pay | Admitting: Family Medicine

## 2024-11-07 VITALS — BP 138/66 | HR 51 | Temp 98.3°F | Resp 18 | Ht 72.0 in | Wt 206.0 lb

## 2024-11-07 DIAGNOSIS — I10 Essential (primary) hypertension: Secondary | ICD-10-CM | POA: Diagnosis not present

## 2024-11-07 LAB — BASIC METABOLIC PANEL WITH GFR
BUN: 17 mg/dL (ref 6–23)
CO2: 29 meq/L (ref 19–32)
Calcium: 8.9 mg/dL (ref 8.4–10.5)
Chloride: 106 meq/L (ref 96–112)
Creatinine, Ser: 0.9 mg/dL (ref 0.40–1.50)
GFR: 83.29 mL/min (ref >60.00)
Glucose, Bld: 77 mg/dL (ref 70–99)
Potassium: 3.9 meq/L (ref 3.5–5.1)
Sodium: 141 meq/L (ref 135–145)

## 2024-11-07 NOTE — Progress Notes (Unsigned)
   Subjective:    Patient ID: [name], male    DOB: January 11, 1946, 78 y.o.   MRN: [id number]  HPI HTN- last visit BP was 170/82 and we doubled Lisinopril  to 20mg  daily.  Also on Metoprolol  XL 25mg  daily.  BP is much better today.  Denies CP, SOB, HA's, visual changes, edema.   Review of Systems For ROS see HPI     Objective:   Physical Exam Vitals reviewed.  Constitutional:      General: He is not in acute distress.    Appearance: Normal appearance. He is well-developed.  HENT:     Head: Normocephalic and atraumatic.  Eyes:     Extraocular Movements: Extraocular movements intact.     Conjunctiva/sclera: Conjunctivae normal.     Pupils: Pupils are equal, round, and reactive to light.  Neck:     Thyroid : No thyromegaly.  Cardiovascular:     Rate and Rhythm: Normal rate and regular rhythm.     Pulses: Normal pulses.     Heart sounds: Normal heart sounds. No murmur heard. Pulmonary:     Effort: Pulmonary effort is normal. No respiratory distress.     Breath sounds: Normal breath sounds.  Abdominal:     General: Bowel sounds are normal. There is no distension.     Palpations: Abdomen is soft.  Musculoskeletal:     Cervical back: Normal range of motion and neck supple.     Right lower leg: No edema.     Left lower leg: No edema.  Lymphadenopathy:     Cervical: No cervical adenopathy.  Skin:    General: Skin is warm and dry.  Neurological:     General: No focal deficit present.     Mental Status: He is alert and oriented to person, place, and time.     Cranial Nerves: No cranial nerve deficit.  Psychiatric:        Mood and Affect: Mood normal.        Behavior: Behavior normal.           Assessment & Plan:

## 2024-11-07 NOTE — Patient Instructions (Signed)
 Follow up in 5 months to recheck blood pressure We'll notify you of your lab results and make any changes if needed Keep up the good work!  You look great! Call with any questions or concerns HAPPY HOLIDAYS!!!

## 2024-11-09 NOTE — Assessment & Plan Note (Signed)
 Chronic problem.  BP is MUCH improved w/ increased dose of Lisinopril .  Continue 20mg  daily and Metoprolol  XL 25mg  daily.  Check BMP to ensure proper metabolism but no anticipated med changes.

## 2024-11-10 ENCOUNTER — Other Ambulatory Visit: Payer: Self-pay | Admitting: Family Medicine

## 2024-11-10 DIAGNOSIS — M1712 Unilateral primary osteoarthritis, left knee: Secondary | ICD-10-CM

## 2024-11-19 DIAGNOSIS — D485 Neoplasm of uncertain behavior of skin: Secondary | ICD-10-CM | POA: Diagnosis not present

## 2024-12-22 DIAGNOSIS — I10 Essential (primary) hypertension: Secondary | ICD-10-CM

## 2025-04-07 ENCOUNTER — Ambulatory Visit: Admitting: Family Medicine

## 2025-09-08 ENCOUNTER — Other Ambulatory Visit

## 2025-09-08 ENCOUNTER — Other Ambulatory Visit (HOSPITAL_COMMUNITY)

## 2025-09-15 ENCOUNTER — Ambulatory Visit: Admitting: Oncology
